# Patient Record
Sex: Male | Born: 1955 | Race: White | Hispanic: No | Marital: Married | Smoking: Current some day smoker
Health system: Southern US, Community
[De-identification: ages and names within clinical notes are randomized; demographics above are authoritative.]

## PROBLEM LIST (undated history)

## (undated) DIAGNOSIS — I10 Essential (primary) hypertension: Secondary | ICD-10-CM

## (undated) DIAGNOSIS — G1221 Amyotrophic lateral sclerosis: Secondary | ICD-10-CM

## (undated) DIAGNOSIS — R0689 Other abnormalities of breathing: Secondary | ICD-10-CM

## (undated) DIAGNOSIS — J45909 Unspecified asthma, uncomplicated: Secondary | ICD-10-CM

## (undated) HISTORY — PX: BACK SURGERY: SHX140

---

## 2008-07-14 HISTORY — PX: EYE SURGERY: SHX253

## 2013-01-25 DIAGNOSIS — G1221 Amyotrophic lateral sclerosis: Secondary | ICD-10-CM | POA: Insufficient documentation

## 2013-01-25 DIAGNOSIS — I10 Essential (primary) hypertension: Secondary | ICD-10-CM

## 2013-01-25 HISTORY — DX: Amyotrophic lateral sclerosis: G12.21

## 2013-01-25 HISTORY — DX: Essential (primary) hypertension: I10

## 2014-06-12 DIAGNOSIS — R0689 Other abnormalities of breathing: Secondary | ICD-10-CM | POA: Insufficient documentation

## 2014-06-12 HISTORY — DX: Other abnormalities of breathing: R06.89

## 2018-08-01 ENCOUNTER — Other Ambulatory Visit: Payer: Self-pay

## 2018-08-01 ENCOUNTER — Encounter (HOSPITAL_COMMUNITY)
Admission: EM | Disposition: A | Discharge status: Skilled Nursing Facility | Payer: Self-pay | Source: Home / Self Care | Attending: Student

## 2018-08-01 ENCOUNTER — Inpatient Hospital Stay (HOSPITAL_COMMUNITY)
Admission: EM | Admit: 2018-08-01 | Discharge: 2018-08-10 | DRG: 570 | Disposition: A | Discharge status: Skilled Nursing Facility | Payer: Medicare Other | Attending: Student | Admitting: Student

## 2018-08-01 ENCOUNTER — Inpatient Hospital Stay (HOSPITAL_COMMUNITY): Payer: Medicare Other | Admitting: Certified Registered Nurse Anesthetist

## 2018-08-01 ENCOUNTER — Encounter (HOSPITAL_COMMUNITY): Payer: Self-pay | Admitting: Internal Medicine

## 2018-08-01 ENCOUNTER — Emergency Department (HOSPITAL_COMMUNITY): Payer: Medicare Other

## 2018-08-01 DIAGNOSIS — I10 Essential (primary) hypertension: Secondary | ICD-10-CM | POA: Diagnosis present

## 2018-08-01 DIAGNOSIS — G8191 Hemiplegia, unspecified affecting right dominant side: Secondary | ICD-10-CM | POA: Diagnosis present

## 2018-08-01 DIAGNOSIS — L03116 Cellulitis of left lower limb: Principal | ICD-10-CM | POA: Insufficient documentation

## 2018-08-01 DIAGNOSIS — G1221 Amyotrophic lateral sclerosis: Secondary | ICD-10-CM | POA: Diagnosis present

## 2018-08-01 DIAGNOSIS — D638 Anemia in other chronic diseases classified elsewhere: Secondary | ICD-10-CM | POA: Diagnosis present

## 2018-08-01 DIAGNOSIS — M86172 Other acute osteomyelitis, left ankle and foot: Secondary | ICD-10-CM

## 2018-08-01 DIAGNOSIS — L089 Local infection of the skin and subcutaneous tissue, unspecified: Secondary | ICD-10-CM | POA: Diagnosis not present

## 2018-08-01 DIAGNOSIS — E43 Unspecified severe protein-calorie malnutrition: Secondary | ICD-10-CM | POA: Diagnosis present

## 2018-08-01 DIAGNOSIS — Z6821 Body mass index (BMI) 21.0-21.9, adult: Secondary | ICD-10-CM | POA: Diagnosis not present

## 2018-08-01 DIAGNOSIS — E46 Unspecified protein-calorie malnutrition: Secondary | ICD-10-CM | POA: Diagnosis present

## 2018-08-01 DIAGNOSIS — M869 Osteomyelitis, unspecified: Secondary | ICD-10-CM | POA: Diagnosis present

## 2018-08-01 DIAGNOSIS — E222 Syndrome of inappropriate secretion of antidiuretic hormone: Secondary | ICD-10-CM | POA: Diagnosis not present

## 2018-08-01 DIAGNOSIS — E876 Hypokalemia: Secondary | ICD-10-CM | POA: Diagnosis present

## 2018-08-01 DIAGNOSIS — Z87891 Personal history of nicotine dependence: Secondary | ICD-10-CM

## 2018-08-01 DIAGNOSIS — M726 Necrotizing fasciitis: Secondary | ICD-10-CM | POA: Diagnosis present

## 2018-08-01 DIAGNOSIS — B9561 Methicillin susceptible Staphylococcus aureus infection as the cause of diseases classified elsewhere: Secondary | ICD-10-CM | POA: Diagnosis present

## 2018-08-01 DIAGNOSIS — K769 Liver disease, unspecified: Secondary | ICD-10-CM | POA: Diagnosis present

## 2018-08-01 DIAGNOSIS — Z993 Dependence on wheelchair: Secondary | ICD-10-CM

## 2018-08-01 DIAGNOSIS — Z791 Long term (current) use of non-steroidal anti-inflammatories (NSAID): Secondary | ICD-10-CM | POA: Diagnosis not present

## 2018-08-01 DIAGNOSIS — J449 Chronic obstructive pulmonary disease, unspecified: Secondary | ICD-10-CM | POA: Diagnosis present

## 2018-08-01 DIAGNOSIS — A480 Gas gangrene: Secondary | ICD-10-CM | POA: Diagnosis present

## 2018-08-01 HISTORY — DX: Amyotrophic lateral sclerosis: G12.21

## 2018-08-01 HISTORY — DX: Other abnormalities of breathing: R06.89

## 2018-08-01 HISTORY — PX: I & D EXTREMITY: SHX5045

## 2018-08-01 HISTORY — DX: Essential (primary) hypertension: I10

## 2018-08-01 LAB — COMPREHENSIVE METABOLIC PANEL
ALT: 30 U/L (ref 0–44)
AST: 38 U/L (ref 15–41)
Albumin: 3 g/dL — ABNORMAL LOW (ref 3.5–5.0)
Alkaline Phosphatase: 89 U/L (ref 38–126)
Anion gap: 15 (ref 5–15)
BUN: 29 mg/dL — ABNORMAL HIGH (ref 8–23)
CO2: 26 mmol/L (ref 22–32)
Calcium: 8.7 mg/dL — ABNORMAL LOW (ref 8.9–10.3)
Chloride: 94 mmol/L — ABNORMAL LOW (ref 98–111)
Creatinine, Ser: 0.9 mg/dL (ref 0.61–1.24)
GFR calc Af Amer: >60 mL/min (ref >60)
GFR calc non Af Amer: >60 mL/min (ref >60)
Glucose, Bld: 103 mg/dL — ABNORMAL HIGH (ref 70–99)
Potassium: 3.3 mmol/L — ABNORMAL LOW (ref 3.5–5.1)
Sodium: 135 mmol/L (ref 135–145)
Total Bilirubin: 1.7 mg/dL — ABNORMAL HIGH (ref 0.3–1.2)
Total Protein: 7.7 g/dL (ref 6.5–8.1)

## 2018-08-01 LAB — CBC WITH DIFFERENTIAL/PLATELET
Abs Immature Granulocytes: 0.97 10*3/uL — ABNORMAL HIGH (ref 0.00–0.07)
Basophils Absolute: 0.1 10*3/uL (ref 0.0–0.1)
Basophils Relative: 1 %
Eosinophils Absolute: 0 10*3/uL (ref 0.0–0.5)
Eosinophils Relative: 0 %
HCT: 44.9 % (ref 39.0–52.0)
Hemoglobin: 14.4 g/dL (ref 13.0–17.0)
Immature Granulocytes: 3 %
Lymphocytes Relative: 4 %
Lymphs Abs: 1.2 10*3/uL (ref 0.7–4.0)
MCH: 29.9 pg (ref 26.0–34.0)
MCHC: 32.1 g/dL (ref 30.0–36.0)
MCV: 93.2 fL (ref 80.0–100.0)
MONOS PCT: 9 %
Monocytes Absolute: 2.4 10*3/uL — ABNORMAL HIGH (ref 0.1–1.0)
NEUTROS ABS: 23.5 10*3/uL — AB (ref 1.7–7.7)
Neutrophils Relative %: 83 %
Platelets: 439 10*3/uL — ABNORMAL HIGH (ref 150–400)
RBC: 4.82 MIL/uL (ref 4.22–5.81)
RDW: 13.2 % (ref 11.5–15.5)
WBC: 28.3 10*3/uL — ABNORMAL HIGH (ref 4.0–10.5)
nRBC: 0 % (ref 0.0–0.2)

## 2018-08-01 LAB — PROTIME-INR
INR: 1.19
Prothrombin Time: 15 seconds (ref 11.4–15.2)

## 2018-08-01 LAB — I-STAT CG4 LACTIC ACID, ED: Lactic Acid, Venous: 1.59 mmol/L (ref 0.5–1.9)

## 2018-08-01 SURGERY — IRRIGATION AND DEBRIDEMENT EXTREMITY
Anesthesia: General | Site: Foot | Laterality: Left

## 2018-08-01 MED ORDER — PROPOFOL 10 MG/ML IV BOLUS
INTRAVENOUS | Status: DC | PRN
  Administered 2018-08-01: 150 mg via INTRAVENOUS

## 2018-08-01 MED ORDER — PHENYLEPHRINE 40 MCG/ML (10ML) SYRINGE FOR IV PUSH (FOR BLOOD PRESSURE SUPPORT)
PREFILLED_SYRINGE | INTRAVENOUS | Status: DC | PRN
  Administered 2018-08-01: 120 ug via INTRAVENOUS
  Administered 2018-08-01: 80 ug via INTRAVENOUS
  Administered 2018-08-01: 120 ug via INTRAVENOUS
  Administered 2018-08-01: 80 ug via INTRAVENOUS
  Administered 2018-08-01 (×2): 120 ug via INTRAVENOUS
  Administered 2018-08-01 (×2): 80 ug via INTRAVENOUS

## 2018-08-01 MED ORDER — SODIUM CHLORIDE 0.9 % IV BOLUS
1000.0000 mL | Freq: Once | INTRAVENOUS | Status: AC
  Administered 2018-08-01: 1000 mL via INTRAVENOUS

## 2018-08-01 MED ORDER — SODIUM CHLORIDE 0.9 % IV SOLN
INTRAVENOUS | Status: DC
  Administered 2018-08-01 – 2018-08-07 (×12): via INTRAVENOUS

## 2018-08-01 MED ORDER — MORPHINE SULFATE (PF) 4 MG/ML IV SOLN
4.0000 mg | Freq: Once | INTRAVENOUS | Status: AC
  Administered 2018-08-01: 4 mg via INTRAVENOUS
  Filled 2018-08-01: qty 1

## 2018-08-01 MED ORDER — ROCURONIUM BROMIDE 100 MG/10ML IV SOLN
INTRAVENOUS | Status: AC
  Filled 2018-08-01: qty 1

## 2018-08-01 MED ORDER — CLINDAMYCIN PHOSPHATE 600 MG/50ML IV SOLN
600.0000 mg | Freq: Once | INTRAVENOUS | Status: AC
  Administered 2018-08-01: 600 mg via INTRAVENOUS
  Filled 2018-08-01: qty 50

## 2018-08-01 MED ORDER — IPRATROPIUM-ALBUTEROL 0.5-2.5 (3) MG/3ML IN SOLN: 3.0000 mL | Freq: Four times a day (QID) | RESPIRATORY_TRACT | Status: DC | PRN

## 2018-08-01 MED ORDER — ONDANSETRON HCL 4 MG/2ML IJ SOLN: 4.0000 mg | Freq: Once | INTRAMUSCULAR | Status: DC | PRN

## 2018-08-01 MED ORDER — ONDANSETRON HCL 4 MG/2ML IJ SOLN
INTRAMUSCULAR | Status: DC | PRN
  Administered 2018-08-01: 4 mg via INTRAVENOUS

## 2018-08-01 MED ORDER — HEPARIN SODIUM (PORCINE) 5000 UNIT/ML IJ SOLN
5000.0000 [IU] | Freq: Three times a day (TID) | INTRAMUSCULAR | Status: DC
  Administered 2018-08-02 – 2018-08-10 (×24): 5000 [IU] via SUBCUTANEOUS
  Filled 2018-08-01 (×23): qty 1

## 2018-08-01 MED ORDER — OXYCODONE HCL 5 MG/5ML PO SOLN
5.0000 mg | Freq: Once | ORAL | Status: DC | PRN
  Filled 2018-08-01: qty 5

## 2018-08-01 MED ORDER — ALBUTEROL SULFATE (2.5 MG/3ML) 0.083% IN NEBU
INHALATION_SOLUTION | RESPIRATORY_TRACT | Status: AC
  Filled 2018-08-01: qty 3

## 2018-08-01 MED ORDER — SODIUM CHLORIDE 0.9 % IR SOLN
Status: DC | PRN
  Administered 2018-08-01: 3000 mL

## 2018-08-01 MED ORDER — PROPOFOL 10 MG/ML IV BOLUS
INTRAVENOUS | Status: AC
  Filled 2018-08-01: qty 20

## 2018-08-01 MED ORDER — 0.9 % SODIUM CHLORIDE (POUR BTL) OPTIME
TOPICAL | Status: DC | PRN
  Administered 2018-08-01: 1000 mL

## 2018-08-01 MED ORDER — ALBUTEROL SULFATE (2.5 MG/3ML) 0.083% IN NEBU
2.5000 mg | INHALATION_SOLUTION | Freq: Four times a day (QID) | RESPIRATORY_TRACT | Status: DC | PRN
  Administered 2018-08-01: 2.5 mg via RESPIRATORY_TRACT

## 2018-08-01 MED ORDER — ONDANSETRON HCL 4 MG/2ML IJ SOLN
INTRAMUSCULAR | Status: AC
  Filled 2018-08-01: qty 2

## 2018-08-01 MED ORDER — PIPERACILLIN-TAZOBACTAM 3.375 G IVPB 30 MIN
3.3750 g | Freq: Once | INTRAVENOUS | Status: AC
  Administered 2018-08-01: 3.375 g via INTRAVENOUS
  Filled 2018-08-01: qty 50

## 2018-08-01 MED ORDER — PIPERACILLIN-TAZOBACTAM 3.375 G IVPB
INTRAVENOUS | Status: AC
  Filled 2018-08-01: qty 50

## 2018-08-01 MED ORDER — OXYCODONE HCL 5 MG PO TABS: 5.0000 mg | ORAL_TABLET | Freq: Once | ORAL | Status: DC | PRN

## 2018-08-01 MED ORDER — CLINDAMYCIN PHOSPHATE 600 MG/50ML IV SOLN
600.0000 mg | Freq: Three times a day (TID) | INTRAVENOUS | Status: DC
  Administered 2018-08-02 – 2018-08-03 (×4): 600 mg via INTRAVENOUS
  Filled 2018-08-01 (×4): qty 50

## 2018-08-01 MED ORDER — LIDOCAINE 2% (20 MG/ML) 5 ML SYRINGE
INTRAMUSCULAR | Status: DC | PRN
  Administered 2018-08-01: 80 mg via INTRAVENOUS

## 2018-08-01 MED ORDER — PHENYLEPHRINE 40 MCG/ML (10ML) SYRINGE FOR IV PUSH (FOR BLOOD PRESSURE SUPPORT)
PREFILLED_SYRINGE | INTRAVENOUS | Status: AC
  Filled 2018-08-01: qty 10

## 2018-08-01 MED ORDER — HYDROMORPHONE HCL 1 MG/ML IJ SOLN: 1.0000 mg | INTRAMUSCULAR | Status: DC | PRN

## 2018-08-01 MED ORDER — DEXAMETHASONE SODIUM PHOSPHATE 10 MG/ML IJ SOLN
INTRAMUSCULAR | Status: DC | PRN
  Administered 2018-08-01: 10 mg via INTRAVENOUS

## 2018-08-01 MED ORDER — DEXAMETHASONE SODIUM PHOSPHATE 10 MG/ML IJ SOLN
INTRAMUSCULAR | Status: AC
  Filled 2018-08-01: qty 1

## 2018-08-01 MED ORDER — HYDROMORPHONE HCL 1 MG/ML IJ SOLN
1.0000 mg | Freq: Once | INTRAMUSCULAR | Status: AC
  Administered 2018-08-01: 1 mg via INTRAVENOUS
  Filled 2018-08-01: qty 1

## 2018-08-01 MED ORDER — FENTANYL CITRATE (PF) 100 MCG/2ML IJ SOLN
INTRAMUSCULAR | Status: DC | PRN
  Administered 2018-08-01 (×2): 25 ug via INTRAVENOUS
  Administered 2018-08-01: 50 ug via INTRAVENOUS

## 2018-08-01 MED ORDER — FENTANYL CITRATE (PF) 100 MCG/2ML IJ SOLN: 25.0000 ug | INTRAMUSCULAR | Status: DC | PRN

## 2018-08-01 MED ORDER — PIPERACILLIN-TAZOBACTAM 3.375 G IVPB
3.3750 g | Freq: Three times a day (TID) | INTRAVENOUS | Status: DC
  Administered 2018-08-02 – 2018-08-07 (×16): 3.375 g via INTRAVENOUS
  Filled 2018-08-01 (×18): qty 50

## 2018-08-01 MED ORDER — VANCOMYCIN HCL 10 G IV SOLR
1500.0000 mg | Freq: Once | INTRAVENOUS | Status: AC
  Administered 2018-08-01: 1500 mg via INTRAVENOUS
  Filled 2018-08-01: qty 1500

## 2018-08-01 MED ORDER — LIDOCAINE 2% (20 MG/ML) 5 ML SYRINGE
INTRAMUSCULAR | Status: AC
  Filled 2018-08-01: qty 5

## 2018-08-01 MED ORDER — FENTANYL CITRATE (PF) 100 MCG/2ML IJ SOLN
INTRAMUSCULAR | Status: AC
  Filled 2018-08-01: qty 2

## 2018-08-01 MED ORDER — POTASSIUM CHLORIDE 10 MEQ/100ML IV SOLN
10.0000 meq | INTRAVENOUS | Status: AC
  Administered 2018-08-02 (×2): 10 meq via INTRAVENOUS
  Filled 2018-08-01: qty 100

## 2018-08-01 SURGICAL SUPPLY — 47 items
BAG ZIPLOCK 12X15 (MISCELLANEOUS) ×3 IMPLANT
BANDAGE ACE 6X5 VEL STRL LF (GAUZE/BANDAGES/DRESSINGS) IMPLANT
BANDAGE ESMARK 6X9 LF (GAUZE/BANDAGES/DRESSINGS) IMPLANT
BNDG COHESIVE 4X5 TAN STRL (GAUZE/BANDAGES/DRESSINGS) ×3 IMPLANT
BNDG ESMARK 6X9 LF (GAUZE/BANDAGES/DRESSINGS)
COVER SURGICAL LIGHT HANDLE (MISCELLANEOUS) ×3 IMPLANT
COVER WAND RF STERILE (DRAPES) IMPLANT
CUFF TOURN SGL QUICK 34 (TOURNIQUET CUFF) ×2
CUFF TRNQT CYL 34X4X40X1 (TOURNIQUET CUFF) ×1 IMPLANT
DERMABOND ADVANCED (GAUZE/BANDAGES/DRESSINGS)
DERMABOND ADVANCED .7 DNX12 (GAUZE/BANDAGES/DRESSINGS) IMPLANT
DRAPE EXTREMITY T 121X128X90 (DISPOSABLE) IMPLANT
DRSG ADAPTIC 3X8 NADH LF (GAUZE/BANDAGES/DRESSINGS) IMPLANT
DRSG AQUACEL AG ADV 3.5X10 (GAUZE/BANDAGES/DRESSINGS) IMPLANT
DRSG PAD ABDOMINAL 8X10 ST (GAUZE/BANDAGES/DRESSINGS) IMPLANT
DRSG TEGADERM 4X4.75 (GAUZE/BANDAGES/DRESSINGS) IMPLANT
DURAPREP 26ML APPLICATOR (WOUND CARE) IMPLANT
ELECT REM PT RETURN 15FT ADLT (MISCELLANEOUS) ×3 IMPLANT
EVACUATOR 1/8 PVC DRAIN (DRAIN) IMPLANT
GAUZE SPONGE 2X2 8PLY STRL LF (GAUZE/BANDAGES/DRESSINGS) IMPLANT
GAUZE SPONGE 4X4 12PLY STRL (GAUZE/BANDAGES/DRESSINGS) ×3 IMPLANT
GAUZE XEROFORM 5X9 LF (GAUZE/BANDAGES/DRESSINGS) ×3 IMPLANT
GLOVE BIO SURGEON STRL SZ7.5 (GLOVE) ×3 IMPLANT
GLOVE BIOGEL PI IND STRL 8 (GLOVE) ×1 IMPLANT
GLOVE BIOGEL PI INDICATOR 8 (GLOVE) ×2
GLOVE ECLIPSE 8.0 STRL XLNG CF (GLOVE) ×3 IMPLANT
GOWN SPEC L3 XXLG W/TWL (GOWN DISPOSABLE) ×6 IMPLANT
GOWN STRL REUS W/TWL LRG LVL3 (GOWN DISPOSABLE) ×3 IMPLANT
HANDPIECE INTERPULSE COAX TIP (DISPOSABLE) ×2
KIT BASIN OR (CUSTOM PROCEDURE TRAY) ×3 IMPLANT
MANIFOLD NEPTUNE II (INSTRUMENTS) ×3 IMPLANT
PACK GENERAL/GYN (CUSTOM PROCEDURE TRAY) ×3 IMPLANT
PACK TOTAL JOINT (CUSTOM PROCEDURE TRAY) IMPLANT
PAD ABD 7.5X8 STRL (GAUZE/BANDAGES/DRESSINGS) ×3 IMPLANT
PADDING CAST COTTON 6X4 STRL (CAST SUPPLIES) IMPLANT
PROTECTOR NERVE ULNAR (MISCELLANEOUS) ×3 IMPLANT
SET HNDPC FAN SPRY TIP SCT (DISPOSABLE) ×1 IMPLANT
SPONGE GAUZE 2X2 STER 10/PKG (GAUZE/BANDAGES/DRESSINGS)
STAPLER VISISTAT 35W (STAPLE) IMPLANT
SUT ETHILON 2 0 PS N (SUTURE) ×9 IMPLANT
SUT MNCRL AB 4-0 PS2 18 (SUTURE) IMPLANT
SUT VIC AB 1 CT1 36 (SUTURE) IMPLANT
SUT VIC AB 2-0 CT1 27 (SUTURE)
SUT VIC AB 2-0 CT1 TAPERPNT 27 (SUTURE) IMPLANT
SWAB COLLECTION DEVICE MRSA (MISCELLANEOUS) ×3 IMPLANT
SWAB CULTURE ESWAB REG 1ML (MISCELLANEOUS) ×3 IMPLANT
TOWEL OR 17X26 10 PK STRL BLUE (TOWEL DISPOSABLE) ×3 IMPLANT

## 2018-08-01 NOTE — ED Provider Notes (Signed)
[facility] Provider Note   CSN: [id number] Arrival date & time: 08/01/18  1711     History   Chief Complaint Chief Complaint  Patient presents with  . Wound Infection    HPI Bruce Deleon is a 63 y.o. male.  HPI Patient is a 63 year old male presents the emergency department with worsening left medial ankle pain today.  He has had a chronic wound of his left second toe and a chronic wound of the distal aspect of his left medial foot over the past month.  Over the past several days he developed new erythema of the left medial malleolus and today developed a small area of blistering and ecchymosis and some increasing pain and thus he came to the ER for further evaluation.  No fever chills at home.  No history of diabetes.  He has a history of hypertension and ALS.  His pain is moderate in severity.   Past medical history . ALS (amyotrophic lateral sclerosis)  . HTN (hypertension)  . Mixed dysarthria  . Dysphagia, oropharyngeal  . General weakness  . Chronic respiratory insufficiency           Home Medications    Prior to Admission medications   Medication Sig Start Date End Date Taking? Authorizing Provider  naproxen sodium (ALEVE) 220 MG tablet Take 880 mg by mouth 2 (two) times daily.   Yes [provider]    Family History No family history on file.  Social History Social History   Tobacco Use  . Smoking status: Not on file  Substance Use Topics  . Alcohol use: Not on file  . Drug use: Not on file     Allergies   Patient has no known allergies.   Review of Systems Review of Systems  All other systems reviewed and are negative.    Physical Exam Updated Vital Signs BP (!) 145/82 (BP Location: Right Arm)   Pulse 99   Temp 98.5 F (36.9 C) (Oral)   Resp (!) 25   Ht 6' (1.829 m)   Wt 72.6 kg   SpO2 99%   BMI 21.70 kg/m   Physical Exam Vitals signs and nursing note reviewed.  Constitutional:      Appearance: He is well-developed.  HENT:     Head: Normocephalic and atraumatic.  Neck:     Musculoskeletal: Normal range of motion.  Cardiovascular:     Rate and Rhythm: Regular rhythm. Tachycardia present.     Heart sounds: Normal heart sounds.  Pulmonary:     Effort: Pulmonary effort is normal. No respiratory distress.     Breath sounds: Normal breath sounds.  Abdominal:     General: There is no distension.     Palpations: Abdomen is soft.     Tenderness: There is no abdominal tenderness.  Musculoskeletal: Normal range of motion.     Comments: Chronic wound of the left second toe secondary to overlying nail from the left great toe.  There is an area of dry gangrene of the distal aspect of the left medial foot.  Erythema overlying the left medial malleolus with blistering, ecchymosis, crepitus extending up the left medial lower leg.  Dopplerable PT and DP pulse in the left foot.  Skin:    General: Skin is warm and dry.  Neurological:     Mental Status: He is alert and oriented to person, place, and time.  Psychiatric:        Judgment: Judgment normal.  ED Treatments / Results  Labs (all labs ordered are listed, but only abnormal results are displayed) Labs Reviewed  COMPREHENSIVE METABOLIC PANEL - Abnormal; Notable for the following components:      Result Value   Potassium 3.3 (*)    Chloride 94 (*)    Glucose, Bld 103 (*)    BUN 29 (*)    Calcium 8.7 (*)    Albumin 3.0 (*)    Total Bilirubin 1.7 (*)    All other components within normal limits  CBC WITH DIFFERENTIAL/PLATELET - Abnormal; Notable for the following components:   WBC 28.3 (*)    Platelets 439 (*)    Neutro Abs 23.5 (*)    Monocytes Absolute 2.4 (*)    Abs Immature Granulocytes 0.97 (*)    All other components within normal limits  CULTURE, BLOOD (ROUTINE X 2)  CULTURE, BLOOD (ROUTINE X 2)  PROTIME-INR  I-STAT CG4 LACTIC ACID, ED  I-STAT CG4 LACTIC ACID, ED    EKG None  Radiology Dg  Ankle Complete Left  Result Date: 08/01/2018 CLINICAL DATA:  Left foot wound. EXAM: LEFT ANKLE COMPLETE - 3+ VIEW COMPARISON:  None. FINDINGS: Soft tissue air identified in the medial left ankle and medial left forefoot. There is question osteopenia of the distal aspect of the talus. IMPRESSION: Soft tissue air identified in the medial left ankle medial left forefoot. There is questioned osteopenia in the distal aspect of the talus suspicious for osteomyelitis. Electronically Signed   By: Sherian ReinWei-Chen  Lin M.D.   On: 08/01/2018 19:24   Dg Foot Complete Left  Result Date: 08/01/2018 CLINICAL DATA:  Left foot wound. EXAM: LEFT FOOT - COMPLETE 3+ VIEW COMPARISON:  None. FINDINGS: There is osteopenia of distal aspect of the first metatarsal and proximal aspect of the first proximal phalanx suspicious for osteomyelitis. Surrounding soft tissue air is identified. There is soft tissue air identified in the medial left forefoot. There is question osteopenia of the subjacent distal talus, osteomyelitis is not excluded. IMPRESSION: Findings suspicious for osteomyelitis of the distal aspect of the first metatarsal and proximal aspect of the first proximal phalanx. Question osteopenia in the distal talus subjacent to the soft tissue air, osteomyelitis is not excluded. Electronically Signed   By: Sherian ReinWei-Chen  Lin M.D.   On: 08/01/2018 19:22    Procedures .Critical Care Performed by: Azalia Bilisampos, Odysseus Cada, MD Authorized by: Azalia Bilisampos, Dietrich Samuelson, MD   Critical care provider statement:    Critical care time (minutes):  32   Critical care was time spent personally by me on the following activities:  Discussions with consultants, evaluation of patient's response to treatment, examination of patient, ordering and performing treatments and interventions, ordering and review of laboratory studies, ordering and review of radiographic studies, pulse oximetry, re-evaluation of patient's condition, obtaining history from patient or surrogate and  review of old charts   (including critical care time)  Medications Ordered in ED Medications  clindamycin (CLEOCIN) IVPB 600 mg (has no administration in time range)  piperacillin-tazobactam (ZOSYN) IVPB 3.375 g (0 g Intravenous Stopped 08/01/18 1856)  vancomycin (VANCOCIN) 1,500 mg in sodium chloride 0.9 % 500 mL IVPB (1,500 mg Intravenous New Bag/Given 08/01/18 1828)  morphine 4 MG/ML injection 4 mg (4 mg Intravenous Given 08/01/18 1852)     Initial Impression / Assessment and Plan / ED Course  I have reviewed the triage vital signs and the nursing notes.  Pertinent labs & imaging results that were available during my care of the patient were reviewed  by me and considered in my medical decision making (see chart for details).     Worsening cellulitis with concern for gas-forming agent given air noted on plain film and palpable crepitus.  Vancomycin, Zosyn, clindamycin given.  N.p.o.  Case discussed with orthopedic surgeon Dr. Magnus Ivan who will evaluate the patient at the bedside.  Patient will be admitted to the hospitalist service.  White blood cell count of 28,000 noted.  No hypotension.  No fever.  Nontoxic-appearing at the bedside however concern for worsening aggressive cellulitis of the left foot and ankle.   Final Clinical Impressions(s) / ED Diagnoses   Final diagnoses:  Cellulitis of left foot    ED Discharge Orders    None       Azalia Bilis, MD 08/01/18 2109

## 2018-08-01 NOTE — Brief Op Note (Signed)
08/01/2018  11:29 PM  PATIENT:  Ludwin Halpin  63 y.o. male  PRE-OPERATIVE DIAGNOSIS:  necrotizing fascitis left foot/ankle wound  POST-OPERATIVE DIAGNOSIS:  necrotizing fascitis left foot ankle wound  PROCEDURE:  Procedure(s): IRRIGATION AND DEBRIDEMENT foot and ankle amputation first and second rays (Left)  SURGEON:  Surgeon(s) and Role:    Kathryne Hitch, MD - Primary  PHYSICIAN ASSISTANT: Rexene Edison, PA-C  ANESTHESIA:   general  EBL:  25 mL   COUNTS:  YES  TOURNIQUET:   Total Tourniquet Time Documented: Thigh (Left) - 35 minutes Total: Thigh (Left) - 35 minutes   DICTATION: .Other Dictation: Dictation Number [id number]  PLAN OF CARE: Admit to inpatient   PATIENT DISPOSITION:  PACU - guarded condition.   Delay start of Pharmacological VTE agent (>24hrs) due to surgical blood loss or risk of bleeding: yes

## 2018-08-01 NOTE — Anesthesia Procedure Notes (Signed)
Procedure Name: LMA Insertion Date/Time: 08/01/2018 10:34 PM Performed by: Epimenio Sarin, CRNA Pre-anesthesia Checklist: Patient identified, Emergency Drugs available, Suction available, Patient being monitored and Timeout performed Patient Re-evaluated:Patient Re-evaluated prior to induction Oxygen Delivery Method: Circle system utilized Preoxygenation: Pre-oxygenation with 100% oxygen Induction Type: IV induction LMA: LMA with gastric port inserted LMA Size: 4.0 Number of attempts: 1 Dental Injury: Teeth and Oropharynx as per pre-operative assessment

## 2018-08-01 NOTE — H&P (Signed)
History and Physical  Bruce Deleon DVV:[id number] DOB: 1955-11-27 DOA: 08/01/2018 1711  Referring physician: Haywood Lasso [facility]) PCP: Seward Meth, MD  Outpatient Specialists: [facility]  HISTORY   Chief Complaint: L foot wounds and swelling  HPI: Bruce Deleon is a 63 y.o. male with ALS (mainly R sided paraplegia) and hx of HTN who presents from home with acute swelling and pain over L medial ankle. Patient and wife reports that he has had chronic wounds for several weeks over his left lateral 1st toe MTP and L 2nd toe, but new wound on L medial ankle was noted over the past week. Patient thinks that these wounds initially started as pressure ulcers. Severe throbbing pain over the L medial ankle, especially with palpation. Erythema extending into upper foot over the past few days. Per wife, no other pressure ulcers that she has noticed. She had also mentioned that patient is reticent to see medical providers. Denies fevers or chills. Has not noticed any drainage over the other L foot wounds. Patient reports that he does not use gauze or dressing/wound care for the wounds on his L foot other than changing his socks everyday. Wife also reported that they had acquired a wheelchair recently, but that he has not been able to use it because their house is not wheelchair accessible and has no ramp. Regarding ALS, patient reports that he has been able to ambulate with a walker until the past few weeks when he has been limited due to his L foot pain. Main symptoms are R sided paraplegia and R upper extremity weakness. Denies shortness of breath.   Review of Systems:  - no fevers/chills - no cough - no chest pain, dyspnea on exertion - no edema, PND, orthopnea - no nausea/vomiting; no tarry, melanotic or bloody stools - no dysuria, increased urinary frequency - no weight changes  Rest of systems reviewed are negative, except as per above history.   ED course:  Vitals Blood pressure (!) 146/69,  pulse 87, temperature 98.5 F (36.9 C), temperature source Oral, resp. rate (!) 25, height 6' (1.829 m), weight 72.6 kg, SpO2 99 %. Received IV vanc, zosyn, clindamycin; dilaudid 1mg  x 1; morphine 4mg  x 1; NS bolus x 1  Past Medical History:  Diagnosis Date  . 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 Chronic respiratory insufficiency 06/12/2014  . Essential hypertension 01/25/2013   History reviewed. No pertinent surgical history.  Social History:  reports that he has quit smoking. He does not have any smokeless tobacco history on file. No history on file for alcohol and drug.  No Known Allergies  No family history on file.    Prior to Admission medications   Medication Sig Start Date End Date Taking? Authorizing Provider  naproxen sodium (ALEVE) 220 MG tablet Take 880 mg by mouth 2 (two) times daily.   Yes [provider]    PHYSICAL EXAM   Temp:  [98.5 F (36.9 C)] 98.5 F (36.9 C) (01/19 1741) Pulse Rate:  [80-107] 87 (01/19 2100) Resp:  [16-26] 25 (01/19 2100) BP: (137-160)/(69-87) 146/69 (01/19 2100) SpO2:  [96 %-100 %] 99 % (01/19 2100) Weight:  [72.6 kg] 72.6 kg (01/19 1741)  BP (!) 146/69   Pulse 87   Temp 98.5 F (36.9 C) (Oral)   Resp (!) 25   Ht 6' (1.829 m)   Wt 72.6 kg   SpO2 99%   BMI 21.70 kg/m    GEN thin elderly caucasian  male; resting in bed, mildly uncomfortable; breathing unlabored  HEENT NCAT EOM intact PERRL; clear oropharynx, no cervical LAD; dry mucus membranes  JVP estimated 5 cm H2O above RA; no HJR ; no carotid bruits b/l ;  CV regular normal rate; normal S1 and S2; no m/r/g or S3/S4; PMI non displaced; no parasternal heave  RESP  End expiratory wheezes; clear on inspiration; breathing unlabored and symmetric  ABD soft NT ND +normoactive BS  EXT warm throughout b/l; no peripheral edema b/l  PULSES  DP and radials 2+ intact b/l  SKIN/MSK  Multiple protruding skin-colored round lesions on face, non-erythematous; no  purulence or discharge  L foot: large erythematous region with edema over L medial ankle with yellow blister visible over medial malleolus; +crepitus and tenderness with palpation;  large black eschar over L lateral MTP 1st toe with long fiber-like foreign object protruding from middle of wound Deep wound with black eschar over distal 2nd toe where 1st toenail has punctured the skin NEURO/PSYCH AAOx4; 3/5 strength over RLE; did not test LLE motor strength; 4/5 RUE strength; 5/5 LUE strength.   DATA   LABS ON ADMISSION:  Basic Metabolic Panel: Recent Labs  Lab 08/01/18 1751  NA 135  K 3.3*  CL 94*  CO2 26  GLUCOSE 103*  BUN 29*  CREATININE 0.90  CALCIUM 8.7*   CBC: Recent Labs  Lab 08/01/18 1751  WBC 28.3*  NEUTROABS 23.5*  HGB 14.4  HCT 44.9  MCV 93.2  PLT 439*   Liver Function Tests: Recent Labs  Lab 08/01/18 1751  AST 38  ALT 30  ALKPHOS 89  BILITOT 1.7*  PROT 7.7  ALBUMIN 3.0*   No results for input(s): LIPASE, AMYLASE in the last 168 hours. No results for input(s): AMMONIA in the last 168 hours. Coagulation:  Lab Results  Component Value Date   INR 1.19 08/01/2018   No results found for: PTT Lactic Acid, Venous:     Component Value Date/Time   LATICACIDVEN 1.59 08/01/2018 1758   Cardiac Enzymes: No results for input(s): CKTOTAL, CKMB, CKMBINDEX, TROPONINI in the last 168 hours. Urinalysis: No results found for: COLORURINE, APPEARANCEUR, LABSPEC, PHURINE, GLUCOSEU, HGBUR, BILIRUBINUR, KETONESUR, PROTEINUR, UROBILINOGEN, NITRITE, LEUKOCYTESUR  BNP (last 3 results) No results for input(s): PROBNP in the last 8760 hours. CBG: No results for input(s): GLUCAP in the last 168 hours.  Radiological Exams on Admission: Dg Ankle Complete Left  Result Date: 08/01/2018 CLINICAL DATA:  Left foot wound. EXAM: LEFT ANKLE COMPLETE - 3+ VIEW COMPARISON:  None. FINDINGS: Soft tissue air identified in the medial left ankle and medial left forefoot. There is  question osteopenia of the distal aspect of the talus. IMPRESSION: Soft tissue air identified in the medial left ankle medial left forefoot. There is questioned osteopenia in the distal aspect of the talus suspicious for osteomyelitis. Electronically Signed   By: Sherian ReinWei-Chen  Lin M.D.   On: 08/01/2018 19:24   Dg Foot Complete Left  Result Date: 08/01/2018 CLINICAL DATA:  Left foot wound. EXAM: LEFT FOOT - COMPLETE 3+ VIEW COMPARISON:  None. FINDINGS: There is osteopenia of distal aspect of the first metatarsal and proximal aspect of the first proximal phalanx suspicious for osteomyelitis. Surrounding soft tissue air is identified. There is soft tissue air identified in the medial left forefoot. There is question osteopenia of the subjacent distal talus, osteomyelitis is not excluded. IMPRESSION: Findings suspicious for osteomyelitis of the distal aspect of the first metatarsal and proximal aspect of the first proximal  phalanx. Question osteopenia in the distal talus subjacent to the soft tissue air, osteomyelitis is not excluded. Electronically Signed   By: Sherian Rein M.D.   On: 08/01/2018 19:22    EKG: admission EKG pending  I have reviewed the patient's previous electronic chart records, labs, and other data.   ASSESSMENT AND PLAN   Assessment: Dragon Stagg is a 63 y.o. male with ALS (mainly R sided paraplegia) who presents with acute swelling and pain in L medial ankle and forefoot in setting of chronic wounds on the L foot with exam and imaging concerning for gas infiltration in the soft tissues at the medial malleolus. Given the severity of the infection, plan for orthopedics intervention in the OR tonight. Regarding his long-term care, will need to discuss with patient and family regarding his current level of care at home and potential placement on this admission, given the extent of his foot wounds that were left untreated for several weeks.   Active Problems:   Osteomyelitis (HCC)   Plan:     # Left medial malleolus infection with gas in soft tissue - concerning for necrotizing fasciitis vs gas gangrene with possible underlying osteomyelitis in distal talus > chronic wounds in L lateral 1st toe MTP and distal 2nd toe wound caused by 1st toenail  - abx: vancomycin, zosyn, and IV clindamycin (D1 08/01/18) tonight - orthopedics Magnus Ivan) consulted; plan for OR tonight - NPO - type and screen ordered - admit to telemetry post op / or as per orthopedics  - pain control: IV dilaudid - fluids with NS @ 100 cc/hr   # ALS (mainly R sided paraplegia at this time) > note: home is currently not wheelchair accessible - patient will need closer monitoring at home given extent of foot wounds while at home - plan to discuss with family during admission regarding placement - PT/OT eval ordered  - SW consult ordered  # Reported COPD and former smoker > mild end expiratory wheezing on exam; pt denies resp sx on admission  - duonebs q6h prn SOB/wheezing  - likely will benefit from steroid/LABA inhaler  - incentive spirometer  # Hypokalemia K 3.3  - repleting overnight with IV K 20 mEq  - repeat BMP in AM  # Hx of HTN - currently not on any medications - allow permissive HTNs to systolics 150s given infection      DVT Prophylaxis: heparin sq  Code Status:  Full Code Family Communication: patient and wife at bedside  Disposition Plan: admit to [facility] for emergent ortho evaluation and OR; dispo pending PT/OT and family discussion   Patient contact: Extended Emergency Contact Information Primary Emergency Contact: Ollen Gross Mobile Phone: [phone] Relation: Spouse  Time spent: > 35 mins  Ike Bene, MD [facility] Pager [phone]  If 7PM-7AM, please contact night-coverage [email or web address] Password [facility] 08/01/2018, 9:38 PM

## 2018-08-01 NOTE — ED Triage Notes (Signed)
Pt BIBA from home with c/o left food wound, that is progressing from bottom of foot towards ankle.  Pt reports it started as small blister on foot, has progressively gotten worse.  Pt reports having ALS and has difficulty getting to doctor appts.  EMS reports expiratory wheezes, current smoker. Pt denies CP, SHOB.  Pt also reports poor appetite x3 days.  Hx of hypertension, noncompliant with BP meds for more than a year.

## 2018-08-01 NOTE — Anesthesia Preprocedure Evaluation (Addendum)
Anesthesia Evaluation  Patient identified by MRN, date of birth, ID band Patient awake    Reviewed: Allergy & Precautions, NPO status , Patient's Chart, lab work & pertinent test results  History of Anesthesia Complications Negative for: history of anesthetic complications  Airway Mallampati: II  TM Distance: >3 FB Neck ROM: Full    Dental  (+) Dental Advisory Given, Edentulous Upper, Missing   Pulmonary former smoker,    breath sounds clear to auscultation       Cardiovascular hypertension,  Rhythm:Regular Rate:Normal     Neuro/Psych  ALS (right-sided paraplegia)   Neuromuscular disease negative psych ROS   GI/Hepatic negative GI ROS, Neg liver ROS,   Endo/Other  negative endocrine ROS  Renal/GU negative Renal ROS     Musculoskeletal negative musculoskeletal ROS (+)   Abdominal   Peds  Hematology negative hematology ROS (+)   Anesthesia Other Findings   Reproductive/Obstetrics                            Anesthesia Physical Anesthesia Plan  ASA: III and emergent  Anesthesia Plan: General   Post-op Pain Management:    Induction: Intravenous  PONV Risk Score and Plan: 2 and Treatment may vary due to age or medical condition, Ondansetron and Dexamethasone  Airway Management Planned: LMA  Additional Equipment: None  Intra-op Plan:   Post-operative Plan: Extubation in OR  Informed Consent: I have reviewed the patients History and Physical, chart, labs and discussed the procedure including the risks, benefits and alternatives for the proposed anesthesia with the patient or authorized representative who has indicated his/her understanding and acceptance.     Dental advisory given  Plan Discussed with: CRNA and Anesthesiologist  Anesthesia Plan Comments:        Anesthesia Quick Evaluation

## 2018-08-01 NOTE — Progress Notes (Signed)
A consult was received from an ED physician for Vancomycin per pharmacy dosing.  The patient's profile has been reviewed for ht/wt/allergies/indication/available labs.    A one time order has been placed for Vancomycin 1500mg  IV.  Further antibiotics/pharmacy consults should be ordered by admitting physician if indicated.                       Thank you, Jamse Mead 08/01/2018  6:08 PM

## 2018-08-01 NOTE — Consult Note (Signed)
Reason for Consult: Left foot and ankle infection Referring Physician: Patria Mane, MD/EDP  Bruce Deleon is an 63 y.o. male.  HPI: The patient is a 63 year old gentleman who presented to the emergency room today with worsening left ankle pain, redness as well as fever and chills.  He is someone with ALS.  He has also had a necrotic left foot second toe and a necrotic ulcer on his left great toe for over a month now.  He has not sought any type of treatment for this at all.  Upon presentation to the emergency room, he was noted to have a white blood cell count of 28,000.  He is slightly tachycardic as well.  X-rays showed significant air in the soft tissues on the medial aspect of his ankle concerning for necrotizing fasciitis.  Orthopedic surgery was urgently consulted to address this.  The general medicine service is also been consulted for medical management due to the potential for sepsis.  The patient's wife is with him.  She had encouraged him to seek medical treatment for his left foot issues but he has been reluctant to do so until the last 24 hours when he developed severe left ankle pain and swelling.  Since being in the emergency room, his wife reports that it has gotten significantly worse.  No past medical history on file.  No family history on file.  Social History:  has no history on file for tobacco, alcohol, and drug.  Allergies: No Known Allergies  Medications: I have reviewed the patient's current medications.  Results for orders placed or performed during the hospital encounter of 08/01/18 (from the past 48 hour(s))  Comprehensive metabolic panel     Status: Abnormal   Collection Time: 08/01/18  5:51 PM  Result Value Ref Range   Sodium 135 135 - 145 mmol/L   Potassium 3.3 (L) 3.5 - 5.1 mmol/L   Chloride 94 (L) 98 - 111 mmol/L   CO2 26 22 - 32 mmol/L   Glucose, Bld 103 (H) 70 - 99 mg/dL   BUN 29 (H) 8 - 23 mg/dL   Creatinine, Ser 8.46 0.61 - 1.24 mg/dL   Calcium 8.7 (L) 8.9 -  10.3 mg/dL   Total Protein 7.7 6.5 - 8.1 g/dL   Albumin 3.0 (L) 3.5 - 5.0 g/dL   AST 38 15 - 41 U/L   ALT 30 0 - 44 U/L   Alkaline Phosphatase 89 38 - 126 U/L   Total Bilirubin 1.7 (H) 0.3 - 1.2 mg/dL   GFR calc non Af Amer >60 >60 mL/min   GFR calc Af Amer >60 >60 mL/min   Anion gap 15 5 - 15    Comment: Performed at [facility], [address], [place], [place] [address]  CBC with Differential     Status: Abnormal   Collection Time: 08/01/18  5:51 PM  Result Value Ref Range   WBC 28.3 (H) 4.0 - 10.5 K/uL   RBC 4.82 4.22 - 5.81 MIL/uL   Hemoglobin 14.4 13.0 - 17.0 g/dL   HCT 57.0 17.7 - 93.9 %   MCV 93.2 80.0 - 100.0 fL   MCH 29.9 26.0 - 34.0 pg   MCHC 32.1 30.0 - 36.0 g/dL   RDW 03.0 09.2 - 33.0 %   Platelets 439 (H) 150 - 400 K/uL   nRBC 0.0 0.0 - 0.2 %   Neutrophils Relative % 83 %   Neutro Abs 23.5 (H) 1.7 - 7.7 K/uL   Lymphocytes Relative  4 %   Lymphs Abs 1.2 0.7 - 4.0 K/uL   Monocytes Relative 9 %   Monocytes Absolute 2.4 (H) 0.1 - 1.0 K/uL   Eosinophils Relative 0 %   Eosinophils Absolute 0.0 0.0 - 0.5 K/uL   Basophils Relative 1 %   Basophils Absolute 0.1 0.0 - 0.1 K/uL   RBC Morphology MORPHOLOGY UNREMARKABLE    Immature Granulocytes 3 %   Abs Immature Granulocytes 0.97 (H) 0.00 - 0.07 K/uL    Comment: Performed at [facility], [address] [place], [place] [address]  Protime-INR     Status: None   Collection Time: 08/01/18  5:51 PM  Result Value Ref Range   Prothrombin Time 15.0 11.4 - 15.2 seconds   INR 1.19     Comment: Performed at [facility], [address] [place], [place] [address]  I-Stat CG4 Lactic Acid, ED     Status: None   Collection Time: 08/01/18  5:58 PM  Result Value Ref Range   Lactic Acid, Venous 1.59 0.5 - 1.9 mmol/L    Dg Ankle Complete Left  Result Date: 08/01/2018 CLINICAL DATA:  Left foot wound. EXAM: LEFT ANKLE COMPLETE - 3+ VIEW COMPARISON:  None. FINDINGS: Soft  tissue air identified in the medial left ankle and medial left forefoot. There is question osteopenia of the distal aspect of the talus. IMPRESSION: Soft tissue air identified in the medial left ankle medial left forefoot. There is questioned osteopenia in the distal aspect of the talus suspicious for osteomyelitis. Electronically Signed   By: Sherian ReinWei-Chen  Lin M.D.   On: 08/01/2018 19:24   Dg Foot Complete Left  Result Date: 08/01/2018 CLINICAL DATA:  Left foot wound. EXAM: LEFT FOOT - COMPLETE 3+ VIEW COMPARISON:  None. FINDINGS: There is osteopenia of distal aspect of the first metatarsal and proximal aspect of the first proximal phalanx suspicious for osteomyelitis. Surrounding soft tissue air is identified. There is soft tissue air identified in the medial left forefoot. There is question osteopenia of the subjacent distal talus, osteomyelitis is not excluded. IMPRESSION: Findings suspicious for osteomyelitis of the distal aspect of the first metatarsal and proximal aspect of the first proximal phalanx. Question osteopenia in the distal talus subjacent to the soft tissue air, osteomyelitis is not excluded. Electronically Signed   By: Sherian ReinWei-Chen  Lin M.D.   On: 08/01/2018 19:22    ROS Blood pressure (!) 146/69, pulse 87, temperature 98.5 F (36.9 C), temperature source Oral, resp. rate (!) 25, height 6' (1.829 m), weight 72.6 kg, SpO2 99 %. Physical Exam  Constitutional: He is oriented to person, place, and time. He appears well-developed and well-nourished.  HENT:  Head: Normocephalic.  Eyes: Pupils are equal, round, and reactive to light.  Neck: Normal range of motion.  Cardiovascular: Tachycardia present.  Respiratory: Effort normal.  GI: Soft.  Musculoskeletal:       Feet:  Neurological: He is alert and oriented to person, place, and time.  Psychiatric: He has a normal mood and affect.    Assessment/Plan: Left ankle and foot infection with necrotic second toe as well as clinical findings  concerning for necrotizing fasciitis.  I spoke to the patient and his wife in length.  He needs an emergent irrigation and debridement of his right medial ankle given the crepitation and air in the soft tissue that is certainly concerning for necrotizing fasciitis.  Given the necrotic second toe we should remove this while he is in the operating room and  debride the great toe ulcer/necrotic wound.  We will also need to cut back his toenails as well.  He understands that he will likely need further surgeries and worse case scenario would be a below-knee amputation.  He is going to need IV antibiotics following the surgery and admission to the medicine service due to the possibility of sepsis and given his other medical issues with ALS.  The risk and benefits of surgery were explained in detail and informed consent is obtained.  Kathryne Hitch 08/01/2018, 9:47 PM

## 2018-08-01 NOTE — Transfer of Care (Signed)
Immediate Anesthesia Transfer of Care Note  Patient: Bruce Deleon  Procedure(s) Performed: IRRIGATION AND DEBRIDEMENT foot and ankle amputation first and second rays (Left Foot)  Patient Location: PACU  Anesthesia Type:General  Level of Consciousness: drowsy  Airway & Oxygen Therapy: Patient Spontanous Breathing and Patient connected to face mask oxygen  Post-op Assessment: Report given to RN and Post -op Vital signs reviewed and stable  Post vital signs: Reviewed and stable  Last Vitals:  Vitals Value Taken Time  BP 111/63 08/01/2018 11:33 PM  Temp    Pulse 71 08/01/2018 11:36 PM  Resp 20 08/01/2018 11:36 PM  SpO2 100 % 08/01/2018 11:36 PM  Vitals shown include unvalidated device data.  Last Pain:  Vitals:   08/01/18 1922  TempSrc:   PainSc: 2          Complications: No apparent anesthesia complications

## 2018-08-02 ENCOUNTER — Encounter (HOSPITAL_COMMUNITY): Payer: Self-pay

## 2018-08-02 ENCOUNTER — Other Ambulatory Visit: Payer: Self-pay

## 2018-08-02 LAB — TYPE AND SCREEN
ABO/RH(D): A POS
Antibody Screen: NEGATIVE

## 2018-08-02 LAB — CBC WITH DIFFERENTIAL/PLATELET
Abs Immature Granulocytes: 1.06 10*3/uL — ABNORMAL HIGH (ref 0.00–0.07)
Basophils Absolute: 0.1 10*3/uL (ref 0.0–0.1)
Basophils Relative: 0 %
Eosinophils Absolute: 0 10*3/uL (ref 0.0–0.5)
Eosinophils Relative: 0 %
HCT: 39.2 % (ref 39.0–52.0)
Hemoglobin: 12.4 g/dL — ABNORMAL LOW (ref 13.0–17.0)
Immature Granulocytes: 4 %
Lymphocytes Relative: 2 %
Lymphs Abs: 0.6 10*3/uL — ABNORMAL LOW (ref 0.7–4.0)
MCH: 30.3 pg (ref 26.0–34.0)
MCHC: 31.6 g/dL (ref 30.0–36.0)
MCV: 95.8 fL (ref 80.0–100.0)
Monocytes Absolute: 1.3 10*3/uL — ABNORMAL HIGH (ref 0.1–1.0)
Monocytes Relative: 5 %
NEUTROS PCT: 89 %
Neutro Abs: 24.9 10*3/uL — ABNORMAL HIGH (ref 1.7–7.7)
Platelets: 391 10*3/uL (ref 150–400)
RBC: 4.09 MIL/uL — ABNORMAL LOW (ref 4.22–5.81)
RDW: 13.3 % (ref 11.5–15.5)
WBC: 27.9 10*3/uL — ABNORMAL HIGH (ref 4.0–10.5)
nRBC: 0 % (ref 0.0–0.2)

## 2018-08-02 LAB — BASIC METABOLIC PANEL
Anion gap: 10 (ref 5–15)
BUN: 27 mg/dL — ABNORMAL HIGH (ref 8–23)
CALCIUM: 7.8 mg/dL — AB (ref 8.9–10.3)
CO2: 20 mmol/L — ABNORMAL LOW (ref 22–32)
Chloride: 107 mmol/L (ref 98–111)
Creatinine, Ser: 0.77 mg/dL (ref 0.61–1.24)
GFR calc Af Amer: >60 mL/min (ref >60)
GFR calc non Af Amer: >60 mL/min (ref >60)
Glucose, Bld: 108 mg/dL — ABNORMAL HIGH (ref 70–99)
Potassium: 3.7 mmol/L (ref 3.5–5.1)
Sodium: 137 mmol/L (ref 135–145)

## 2018-08-02 LAB — ABO/RH: ABO/RH(D): A POS

## 2018-08-02 LAB — MAGNESIUM: Magnesium: 2.4 mg/dL (ref 1.7–2.4)

## 2018-08-02 MED ORDER — OXYCODONE HCL 5 MG PO TABS
5.0000 mg | ORAL_TABLET | ORAL | Status: DC | PRN
  Administered 2018-08-02: 10 mg via ORAL
  Administered 2018-08-02: 5 mg via ORAL
  Administered 2018-08-02 – 2018-08-10 (×31): 10 mg via ORAL
  Filled 2018-08-02 (×10): qty 2
  Filled 2018-08-02: qty 1
  Filled 2018-08-02 (×24): qty 2

## 2018-08-02 MED ORDER — HYDROMORPHONE HCL 1 MG/ML IJ SOLN
1.0000 mg | INTRAMUSCULAR | Status: DC | PRN
  Administered 2018-08-03 – 2018-08-09 (×8): 1 mg via INTRAVENOUS
  Filled 2018-08-02 (×8): qty 1

## 2018-08-02 MED ORDER — VANCOMYCIN HCL IN DEXTROSE 1-5 GM/200ML-% IV SOLN
1000.0000 mg | Freq: Two times a day (BID) | INTRAVENOUS | Status: DC
  Administered 2018-08-02 – 2018-08-04 (×5): 1000 mg via INTRAVENOUS
  Filled 2018-08-02 (×5): qty 200

## 2018-08-02 MED ORDER — ALUM & MAG HYDROXIDE-SIMETH 200-200-20 MG/5ML PO SUSP
30.0000 mL | ORAL | Status: DC | PRN
  Administered 2018-08-02 – 2018-08-06 (×4): 30 mL via ORAL
  Filled 2018-08-02 (×4): qty 30

## 2018-08-02 NOTE — Progress Notes (Signed)
Patient ID: Bruce Deleon, male   DOB: [birth year], 63 y.o.   MRN: [id number] Awake and alert this morning.  Pain well controlled.  His left foot dressing is clean and dry.  His vitals are stable and he is non-septic appearing.  The infection involving his left ankle and foot was quite extensive.  His WBC is still significantly elevated.  May require a repeat I&D in 48 hours if does not improve.  Will check on him again tomorrow am.  Will need to be non-weight bearing on his left foot.

## 2018-08-02 NOTE — Progress Notes (Signed)
Pharmacy Antibiotic Note  Bruce Deleon is a 63 y.o. male admitted on 08/01/2018 with Wound Infection.  Pharmacy has been consulted for Vancomycin dosing.  Plan: Zosyn 3.375g IV Q8H infused over 4hrs.  Vancomycin 1500mg  iv x1, then Vancomycin 1000 mg IV Q 12 hrs. Goal AUC 400-550. Expected AUC: 454 SCr used: 0.8   Height: 6' (182.9 cm) Weight: 148 lb 13 oz (67.5 kg) IBW/kg (Calculated) : 77.6  Temp (24hrs), Avg:98.2 F (36.8 C), Min:97.9 F (36.6 C), Max:98.5 F (36.9 C)  Recent Labs  Lab 08/01/18 1751 08/01/18 1758 08/02/18 0143  WBC 28.3*  --  27.9*  CREATININE 0.90  --  0.77  LATICACIDVEN  --  1.59  --     Estimated Creatinine Clearance: 90.2 mL/min (by C-G formula based on SCr of 0.77 mg/dL).    No Known Allergies  Antimicrobials this admission: Vancomycin 08/01/2018 >> Zosyn 08/01/2018 >> Clindamycin 08/01/2018 >>  Dose adjustments this admission: -  Microbiology results: -  Thank you for allowing pharmacy to be a part of this patient's care.  Bruce Deleon 08/02/2018 4:14 AM

## 2018-08-02 NOTE — Evaluation (Signed)
Physical Therapy Evaluation Patient Details Name: Bruce Deleon MRN: [id number] DOB: 11-Nov-1955 Today's Date: 08/02/2018   History of Present Illness   63 yo male admitted 08/01/18 with Left ankle and foot infection with necrotizing fasciitis and necrotic first and second rays; s/p L 1st and 2nd ray amp on 08/01/18 per Dr Magnus Ivan. PMHx: ALS, COPD, HTN  Clinical Impression  Pt admitted with above diagnosis. Pt currently with functional limitations due to the deficits listed below (see PT Problem List).  Pt limited d/t baseline weakness secondary to ALS, and now NWB LLE d/t amputation,  likely will need SNF post acute  Pt will benefit from skilled PT to increase their independence and safety with mobility to allow discharge to the venue listed below.       Follow Up Recommendations SNF    Equipment Recommendations  Hospital bed(? TBA)    Recommendations for Other Services       Precautions / Restrictions Precautions Precautions: Fall Restrictions Weight Bearing Restrictions: Yes LLE Weight Bearing: Non weight bearing      Mobility  Bed Mobility Overal bed mobility: Needs Assistance Bed Mobility: Supine to Sit;Sit to Supine     Supine to sit: Min assist Sit to supine: Min assist   General bed mobility comments: assist with trunk to upright, assist to bring LEs onto bed; incr time  Transfers Overall transfer level: Needs assistance Equipment used: Rolling walker (2 wheeled) Transfers: Sit to/from Stand Sit to Stand: Mod assist;From elevated surface         General transfer comment: assist to rise and stabilize, able to maintain NWB however unable to wt shift d/t UE weakness   Ambulation/Gait             General Gait Details: NT  Stairs            Wheelchair Mobility    Modified Rankin (Stroke Patients Only)       Balance Overall balance assessment: Needs assistance Sitting-balance support: No upper extremity supported;Feet supported Sitting  balance-Leahy Scale: Fair       Standing balance-Leahy Scale: Poor Standing balance comment: reliant on UEs and external support                              Pertinent Vitals/Pain Pain Assessment: No/denies pain    Home Living Family/patient expects to be discharged to:: Private residence Living Arrangements: Spouse/significant other;Parent Available Help at Discharge: Family Type of Home: House Home Access: Stairs to enter   Secretary/administrator of Steps: 2 Home Layout: One level Home Equipment: Environmental consultant - 2 wheels;Wheelchair - manual Additional Comments: pt reports that his wife and his aunt (that lives nearby) assist with errands, meals, laundry etc    Prior Function Level of Independence: Independent with assistive device(s)         Comments: amb with RW until last few wks     Hand Dominance        Extremity/Trunk Assessment   Upper Extremity Assessment Upper Extremity Assessment: Defer to OT evaluation    Lower Extremity Assessment Lower Extremity Assessment: Generalized weakness;LLE deficits/detail LLE Deficits / Details: significant muscle atrophy throughout bil LEs, AROM grossly WFL; knee and hip grossly AROM grossly WFL bil LLE: Unable to fully assess due to immobilization       Communication   Communication: No difficulties  Cognition Arousal/Alertness: Awake/alert Behavior During Therapy: WFL for tasks assessed/performed Overall Cognitive Status: Within Functional Limits for tasks assessed  General Comments      Exercises     Assessment/Plan    PT Assessment Patient needs continued PT services  PT Problem List Decreased strength;Decreased mobility;Decreased balance;Decreased activity tolerance;Decreased knowledge of use of DME       PT Treatment Interventions DME instruction;Functional mobility training;Patient/family education;Gait training;Therapeutic  activities;Therapeutic exercise    PT Goals (Current goals can be found in the Care Plan section)  Acute Rehab PT Goals Patient Stated Goal: have ramp built at home PT Goal Formulation: With patient Time For Goal Achievement: 08/09/18 Potential to Achieve Goals: Fair    Frequency Min 2X/week   Barriers to discharge        Co-evaluation               AM-PAC PT "6 Clicks" Mobility  Outcome Measure Help needed turning from your back to your side while in a flat bed without using bedrails?: A Little Help needed moving from lying on your back to sitting on the side of a flat bed without using bedrails?: A Little Help needed moving to and from a bed to a chair (including a wheelchair)?: A Lot Help needed standing up from a chair using your arms (e.g., wheelchair or bedside chair)?: A Lot Help needed to walk in hospital room?: Total Help needed climbing 3-5 steps with a railing? : Total 6 Click Score: 12    End of Session Equipment Utilized During Treatment: Gait belt Activity Tolerance: Patient limited by fatigue Patient left: with call bell/phone within reach;with bed alarm set;in bed   PT Visit Diagnosis: Unsteadiness on feet (R26.81)    Time: 2778-2423 PT Time Calculation (min) (ACUTE ONLY): 20 min   Charges:   PT Evaluation $PT Eval Low Complexity: 1 Low        Drucilla Chalet, PT  Pager: [phone] Acute Rehab Dept [facility]): [phone]   08/02/2018    [facility] 08/02/2018, 1:35 PM

## 2018-08-02 NOTE — Op Note (Signed)
[name]: Ditmars, Bruce Deleon ACCOUNT [id number] DATE OF BIRTH:12/03/1955 FACILITY: [facility] LOCATION: [facility] PHYSICIAN:Natassja Ollis [name]. [name], MD  OPERATIVE REPORT  DATE OF PROCEDURE:  08/01/2018  PREOPERATIVE DIAGNOSIS:  Left ankle and foot infection with necrotizing fasciitis and necrotic first and second rays.  POSTOPERATIVE DIAGNOSIS:  Left ankle and foot infection with necrotizing fasciitis and necrotic first and second rays.  PROCEDURE: 1.  Extensive irrigation and debridement of left medial ankle and foot. 2.  First and second ray amputations of the left foot.  FINDINGS:  Gross purulence in the plantar aspect of the left foot and left medial ankle with necrotic first and second rays.  SURGEON:  Bruce PandaChristopher Y. Magnus IvanBlackman, MD  ASSISTANT:  Bruce CanalGilbert Clark, PA-C  ANESTHESIA:  General.  ANTIBIOTICS:  Vancomycin, Zosyn prior to surgery.  CULTURES:  Pending.  TOURNIQUET TIME:  Less than 1 hour.  COMPLICATIONS:  None.  INDICATIONS:  The patient is a 63 year old gentleman apparently with ALS who has had a necrotic wound on his left foot for over a month now.  I surmise it has probably been there longer than that.  There is a large half-dollar shaped necrotic wound at  his MTP joint of the first ray with gross purulence coming out from under this.  His second toe was necrotic.  His toenails are significantly deformed, and his great toenail crosses over the second toenail and is causing ischemic changes.  He has also  developed redness with swelling and crepitation of soft tissue on the medial ankle area with a white blood cell count of 28,000, and he is tachycardic.  His x-rays also are concerning for necrotizing fasciitis.  They are in the soft tissues along the  medial ankle and on crepitation on just physical exam.  This infection is worsening rapidly.  I have recommended emergent irrigation and debridement of the ankle and foot with likely removal of the 1st and  2nd rays to get this under control.  DESCRIPTION OF PROCEDURE:  After informed consent was obtained and appropriate left ankle and foot were marked, he was brought to the operating room and placed supine on the operating table.  General anesthesia was then obtained.  A nonsterile tourniquet  was placed around his upper left thigh, and his left knee, leg, foot and ankle were prepped and draped with Betadine and sterile drapes.  A time-out was called and he was identified as correct patient, correct left leg.  I then had the tourniquet  inflated to 250 mm of pressure.  We then made a wide incision over the medial aspect of the ankle and carried this proximally and distally, finding necrotic fascia.  Using a #10 blade, we excised necrotic fascia over a 7-12 cm area.  We then irrigated  this wound with normal saline solution using pulsatile lavage.  I then ellipsed the large eschar off of the medial aspect of his right foot at the first ray over the MTP joint, and there was gross purulence there tracking down to the bone with obvious  osteomyelitis of the metatarsal head and the base of the proximal phalanx.  With that being said, I needed to go ahead and amputate his first ray.  The second toe was severely necrotic.  I removed it as well.  I cut both metatarsals of the first and  second metatarsals back to stable bone.  With a scalpel, we were able to sharply excise and debride necrotic skin, fascia, soft tissue, and bone, also using a rongeur and  bone-cutting forceps.  I did find a plantar wound tracking throughout the whole  plantar fascia.  We had to remove some of the plantar fascia as well.  We then irrigated the wound again with normal saline solution using pulsatile lavage.  We then were able to reapproximate a flap of tissue over the resections from the first and  second ray as well as rearrange the soft tissue in this area.  We were able to loosely approximate the skin over the medial ankle.  A  Xeroform well-padded sterile dressing was applied.  The tourniquet was let down.  The remainder of his toes pinkened  nicely.  He was taken to the recovery room in stable, but certainly guarded position given the fact that he is on the verge of sepsis.  Of note, Bruce Canal PA-C's assistance was greatly appreciated and needed throughout the case.  LN/NUANCE  D:08/01/2018 T:08/02/2018 JOB:[id number]

## 2018-08-02 NOTE — Progress Notes (Signed)
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 on noninvasive ventilation with trilogy, HTN  Came to emergency room 1/19 chronic wounds over left lateral first MTP left second toe and new wound to left medial ankle with severe throbbing pain  On left medial malleolus found to have gas and soft tissue concerning for necrotizing fasciitis versus gas gangrene-orthopedics consulted placed on Vanco Zosyn and clindamycin  Labs on admission showed hypokalemia 3.3 albumin 3.0 lactic acid 1.5 WBC 28.3 hemoglobin 14  Underwent surgery   A & Plan Gas gangrene Appreciate orthopedic input continue clindamycin vancomycin + Zosyn but narrow soon IV wound will be reviewed by Dr. Magnus Ivan and may need to go for repeat debridement Labs in a.m. Needing skilled facility on discharge Pain control Dilaudid and oxycodone as first choice ALS on noninvasive ventilation-chronically wheelchair-bound Continue trilogy as per protocol Seems stable at this time HTN Not on meds at this time Severe malnutrition BMI 20  Lovenox Inpatient No family present Expect will be here through the end of the week  Mahala Menghini, MD  [facility] Via [employer] app OR [email or web address] 7PM-7AM contact night coverage as above 08/02/2018, 7:58 AM  LOS: 1 day   Consultants:  Orthopedics  Procedures:  Debridement on 1/20  Antimicrobials:  Clindamycin, Zosyn, vancomycin since 1/19 awake alert coherent not in much pain tolerating liquids but not hungry at this time no chest pain no fever  Interval history/Subjective: Seen in room Fair no distress Not hungry Pain mod controlled   Objective:  Vitals:  Vitals:   08/02/18 0035 08/02/18 0501  BP: 138/70 132/72  Pulse: 75 72  Resp: 20 18  Temp: 98 F (36.7 C) 98 F (36.7 C)  SpO2: 97% 97%     Exam:   eomi ncat no ict no pallor cta b LE wrapped in occlusive bandage abd soft s1 s 2no mrg--telel benign so dc'd   I have personally reviewed the following:  DATA   Labs:  Wbc 28.3-->27  Hglobin 14-->12  Bun /creat 29.0.9-->27/0,7  Scheduled Meds: . heparin  5,000 Units Subcutaneous Q8H   Continuous Infusions: . sodium chloride Stopped (08/02/18 0332)  . clindamycin (CLEOCIN) IV 100 mL/hr at 08/02/18 0600  . piperacillin-tazobactam (ZOSYN)  IV 12.5 mL/hr at 08/02/18 0600  . vancomycin 1,000 mg (08/02/18 0751)    Principal Problem:   Left foot and ankle infection Active Problems:   Osteomyelitis (HCC)   LOS: 1 day

## 2018-08-03 LAB — CBC WITH DIFFERENTIAL/PLATELET
Abs Immature Granulocytes: 1.16 10*3/uL — ABNORMAL HIGH (ref 0.00–0.07)
Basophils Absolute: 0.1 10*3/uL (ref 0.0–0.1)
Basophils Relative: 0 %
Eosinophils Absolute: 0 10*3/uL (ref 0.0–0.5)
Eosinophils Relative: 0 %
HCT: 42.7 % (ref 39.0–52.0)
Hemoglobin: 13.2 g/dL (ref 13.0–17.0)
IMMATURE GRANULOCYTES: 3 %
LYMPHS PCT: 4 %
Lymphs Abs: 1.3 10*3/uL (ref 0.7–4.0)
MCH: 29.5 pg (ref 26.0–34.0)
MCHC: 30.9 g/dL (ref 30.0–36.0)
MCV: 95.5 fL (ref 80.0–100.0)
Monocytes Absolute: 1.9 10*3/uL — ABNORMAL HIGH (ref 0.1–1.0)
Monocytes Relative: 5 %
Neutro Abs: 32.3 10*3/uL — ABNORMAL HIGH (ref 1.7–7.7)
Neutrophils Relative %: 88 %
Platelets: 447 10*3/uL — ABNORMAL HIGH (ref 150–400)
RBC: 4.47 MIL/uL (ref 4.22–5.81)
RDW: 13.5 % (ref 11.5–15.5)
WBC: 36.8 10*3/uL — ABNORMAL HIGH (ref 4.0–10.5)
nRBC: 0 % (ref 0.0–0.2)

## 2018-08-03 LAB — COMPREHENSIVE METABOLIC PANEL
ALBUMIN: 2.1 g/dL — AB (ref 3.5–5.0)
ALT: 23 U/L (ref 0–44)
AST: 24 U/L (ref 15–41)
Alkaline Phosphatase: 62 U/L (ref 38–126)
Anion gap: 9 (ref 5–15)
BUN: 21 mg/dL (ref 8–23)
CO2: 24 mmol/L (ref 22–32)
CREATININE: 0.65 mg/dL (ref 0.61–1.24)
Calcium: 7.9 mg/dL — ABNORMAL LOW (ref 8.9–10.3)
Chloride: 101 mmol/L (ref 98–111)
GFR calc Af Amer: >60 mL/min (ref >60)
GFR calc non Af Amer: >60 mL/min (ref >60)
GLUCOSE: 102 mg/dL — AB (ref 70–99)
Potassium: 3.6 mmol/L (ref 3.5–5.1)
Sodium: 134 mmol/L — ABNORMAL LOW (ref 135–145)
Total Bilirubin: 0.9 mg/dL (ref 0.3–1.2)
Total Protein: 6.2 g/dL — ABNORMAL LOW (ref 6.5–8.1)

## 2018-08-03 LAB — MAGNESIUM: MAGNESIUM: 2.4 mg/dL (ref 1.7–2.4)

## 2018-08-03 NOTE — Progress Notes (Signed)
Patient ID: Bruce Deleon, male   DOB: June 17, 1956, 63 y.o.   MRN: [id number] No acute changes.  Left foot dressing changed at the bedside.  The wounds look better overall.  There is no bad odor.  I did express fluid from the incisions, but bloody.  New dressing was placed.  I am concerned about his WBC having gone up to 36,000.  This may indicate that he needs a repeat I&D.  Will continue IV antibiotics for now.  He feels better overall and looks better and his vitals are stable.  Will re-assess in the morning tomorrow.

## 2018-08-03 NOTE — Evaluation (Signed)
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 PMHx: ALS, COPD, HTN   Clinical Impression   This 63 y/o male presents with the above. At baseline pt reports he is mod independent with ADL and functional mobility, most recently using RW PTA. Pt presenting with generalized weakness including baseline UE weakness and fine motor deficits. He currently requires minA for bed mobility with minguard assist for static sitting EOB; requires minA for UB ADL, maxA for LB ADL. Pt will benefit from continued acute OT services and recommend follow up therapy services in SNF setting after discharge to maximize his safety and independence with ADL and mobility. Will follow.     Follow Up Recommendations  SNF;Supervision/Assistance - 24 hour    Equipment Recommendations  3 in 1 bedside commode;Other (comment);Wheelchair (measurements OT);Wheelchair cushion (measurements OT)(TBD in next venue, may require drop arm BSC)           Precautions / Restrictions Precautions Precautions: Fall Restrictions Weight Bearing Restrictions: Yes LLE Weight Bearing: Non weight bearing      Mobility Bed Mobility Overal bed mobility: Needs Assistance Bed Mobility: Supine to Sit;Sit to Supine     Supine to sit: Min assist Sit to supine: Min assist   General bed mobility comments: pt slightly impulsive with attempts to transition to EOB, initially requesting to attempt to perform without assist, ultimately requires assist to support trunk upright; assist for LLE when returning to supine for increased safety  Transfers                      Balance Overall balance assessment: Needs assistance Sitting-balance support:  Single extremity supported;Feet supported Sitting balance-Leahy Scale: Fair Sitting balance - Comments: ultimately requires at least single UE support for sitting balance, noticed slight posterior lean during UE assessment                                   ADL either performed or assessed with clinical judgement   ADL Overall ADL's : Needs assistance/impaired Eating/Feeding: Set up;Sitting Eating/Feeding Details (indicate cue type and reason): pt reports significant difficulty with self-feeding, discussed option of using built up handle to increase ease of performing fine motor tasks Grooming: Set up;Sitting   Upper Body Bathing: Min guard;Sitting   Lower Body Bathing: Moderate assistance;Sitting/lateral leans   Upper Body Dressing : Minimal assistance;Sitting Upper Body Dressing Details (indicate cue type and reason): due to decreased fine motor Lower Body Dressing: Moderate assistance;Maximal assistance;Sitting/lateral leans                 General ADL Comments: pt performing bed mobility and sat EOB during session, pt with increased SOB and fatigue with performing bed mobility, (SpO2 monitored and 96% on RA); anticipate pt will require +2 for transfers due to weakness and NWB status      Vision         Perception     Praxis      Pertinent Vitals/Pain Pain Assessment: Faces Faces Pain Scale: Hurts little more Pain Location: LLE Pain Descriptors / Indicators: Discomfort;Sore Pain Intervention(s): Monitored during session;Premedicated before session;Repositioned     Hand Dominance Right  Extremity/Trunk Assessment Upper Extremity Assessment Upper Extremity Assessment: RUE deficits/detail;LUE deficits/detail RUE Deficits / Details: grossly 3/5 throughout shoulder and elbow, significant weakness due to baseline ALS, decreased digit ROM and weak grip RUE Coordination: decreased fine motor LUE Deficits / Details: grossly 3/5 throughout, digit ROM and  grip strength better in LUE compared to RUE LUE Coordination: decreased fine motor   Lower Extremity Assessment Lower Extremity Assessment: Defer to PT evaluation       Communication Communication Communication: No difficulties   Cognition Arousal/Alertness: Awake/alert Behavior During Therapy: WFL for tasks assessed/performed Overall Cognitive Status: Within Functional Limits for tasks assessed                                     General Comments       Exercises     Shoulder Instructions      Home Living Family/patient expects to be discharged to:: Private residence Living Arrangements: Spouse/significant other;Parent Available Help at Discharge: Family Type of Home: House Home Access: Stairs to enter Secretary/administrator of Steps: 2   Home Layout: One level     Bathroom Shower/Tub: Chief Strategy Officer: Standard     Home Equipment: Environmental consultant - 2 wheels;Wheelchair - manual   Additional Comments: pt reports that his wife and his aunt (that lives nearby) assist with errands, meals, laundry etc; reports he has most recently been staying with his mom to assist her       Prior Functioning/Environment Level of Independence: Independent with assistive device(s)        Comments: amb with RW until last few wks        OT Problem List: Decreased strength;Decreased range of motion;Impaired balance (sitting and/or standing);Decreased activity tolerance;Pain;Decreased knowledge of use of DME or AE      OT Treatment/Interventions: Self-care/ADL training;Therapeutic exercise;DME and/or AE instruction;Patient/family education;Balance training    OT Goals(Current goals can be found in the care plan section) Acute Rehab OT Goals Patient Stated Goal: have ramp built at home OT Goal Formulation: With patient Time For Goal Achievement: 08/17/18 Potential to Achieve Goals: Good  OT Frequency: Min 2X/week   Barriers to D/C:             Co-evaluation              AM-PAC OT "6 Clicks" Daily Activity     Outcome Measure Help from another person eating meals?: A Little Help from another person taking care of personal grooming?: A Little Help from another person toileting, which includes using toliet, bedpan, or urinal?: Total Help from another person bathing (including washing, rinsing, drying)?: A Lot Help from another person to put on and taking off regular upper body clothing?: A Little Help from another person to put on and taking off regular lower body clothing?: A Lot 6 Click Score: 14   End of Session Nurse Communication: Mobility status  Activity Tolerance: Patient tolerated treatment well Patient left: in bed;with call bell/phone within reach;with bed alarm set  OT Visit Diagnosis: Other abnormalities of gait and mobility (R26.89);Muscle weakness (generalized) (M62.81)                Time: 1014-1030 OT Time Calculation (min): 16 min Charges:  OT General Charges $OT Visit: 1 Visit OT Evaluation $OT Eval Moderate Complexity: 1 Mod  Marcy Siren, OT [employer] Pager [phone] Office [phone]   Orlando Penner 08/03/2018, 12:56  PM

## 2018-08-03 NOTE — Anesthesia Postprocedure Evaluation (Signed)
Anesthesia Post Note  Patient: Osamah Posten  Procedure(s) Performed: IRRIGATION AND DEBRIDEMENT foot and ankle amputation first and second rays (Left Foot)     Patient location during evaluation: PACU Anesthesia Type: General Level of consciousness: awake and alert Pain management: pain level controlled Vital Signs Assessment: post-procedure vital signs reviewed and stable Respiratory status: spontaneous breathing, nonlabored ventilation, respiratory function stable and patient connected to nasal cannula oxygen Cardiovascular status: blood pressure returned to baseline and stable Postop Assessment: no apparent nausea or vomiting Anesthetic complications: no    Last Vitals:  Vitals:   08/02/18 1330 08/03/18 0433  BP: 129/70 (!) 136/93  Pulse: 77 78  Resp: 18 20  Temp: 36.9 C 36.7 C  SpO2: 99% 96%    Last Pain:  Vitals:   08/03/18 0448  TempSrc:   PainSc: Asleep                 Beryle Lathe

## 2018-08-03 NOTE — Clinical Social Work Note (Signed)
Clinical Social Work Assessment  Patient Details  Name: Bruce Deleon MRN: [id number] Date of Birth: 08/08/1955  Date of referral:  08/03/18               Reason for consult:  Facility Placement                Permission sought to share information with:    Permission granted to share information::     Name::        Agency::     Relationship::     Contact Information:     Housing/Transportation Living arrangements for the past 2 months:  Single Family Home Source of Information:  Patient Patient Interpreter Needed:  None Criminal Activity/Legal Involvement Pertinent to Current Situation/Hospitalization:  No - Comment as needed Significant Relationships:  Spouse, Parents Lives with:  Self, Parents Do you feel safe going back to the place where you live?  Yes Need for family participation in patient care:  No (Coment)  Care giving concerns:  Pt admitted from home where he resides with his elderly mother (Assists with supervising her care). Wife lives near [place] where pt's actual home is however he reports living in [place] to help his mother for several years. Pt admitted for infection in left foot, required immediate surgery, had second toe amputated. May need repeat I&D. Also possibility of further surgery in future per providers. Pt has ALS and at baseline cares for self and ambulates with walker. Drives and handles mother's care as well.   Social Worker assessment / plan:  CSW consulted to assist with disposition- pt will be needing short term rehab at [place] following surgery- is NWB on left foot. Pt familiar with nature of treatment and goals of short term rehab at SNF due to his mother having gone last year. Pt agreeable to this plan for himself. CSW made referrals- will complete FL2 closer to DC so that care needs will be most updated on document. Pt will need [employer] prior authorization to admit to SNF.  Employment status:  Retired, Disabled (Comment on whether or not  currently receiving Disability)(recieves disability) Insurance information:  S[employer] broker) PT Recommendations:  Skilled Nursing Facility Information / Referral to community resources:  Skilled Nursing Facility  Patient/Family's Response to care:  Pt appreciative  Patient/Family's Understanding of and Emotional Response to Diagnosis, Current Treatment, and Prognosis:  Pt shows good understanding of the treatment he has received and care needs going forward. Pt explains having not sought treatment for infection earlier thinking wound would heal on its own.   Emotional Assessment Appearance:  Appears stated age Attitude/Demeanor/Rapport:  Engaged Affect (typically observed):  Accepting, Calm, Adaptable, Pleasant Orientation:  Oriented to Self, Oriented to Place, Oriented to  Time, Oriented to Situation Alcohol / Substance use:  Not Applicable Psych involvement (Current and /or in the community):  No (Comment)  Discharge Needs  Concerns to be addressed:  Discharge Planning Concerns Readmission within the last 30 days:  No Current discharge risk:    Barriers to Discharge:  Continued Medical Work up   [employer], LCSW 08/03/2018, 2:15 PM [phone]

## 2018-08-03 NOTE — Progress Notes (Addendum)
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 on noninvasive ventilation with trilogy, HTN  Came to emergency room 1/19 chronic wounds over left lateral first MTP left second toe and new wound to left medial ankle with severe throbbing pain  On left medial malleolus found to have gas and soft tissue concerning for necrotizing fasciitis versus gas gangrene-orthopedics consulted placed on Vanco Zosyn and clindamycin  Labs on admission showed hypokalemia 3.3 albumin 3.0 lactic acid 1.5 WBC 28.3 hemoglobin 14  Underwent surgery--however wounds to be reassessed on 1:22 AM and need for further surgery defer to orthopedics   A & Plan Gas gangrene Appreciate orthopedic input continue I did narrow off of clindamycin but will continue Vanco and Zosyn for gram-positive gram-negative and some anaerobic coverage Leukocytosis may be postop but wound to be reviewed as per Dr. Magnus Ivan Further management as per orthopedics in terms of need for amputation or further I&D at their next visit appreciate input Labs in a.m. Needing skilled facility on discharge Pain control Dilaudid and oxycodone as first choice it is relatively controlled ALS on noninvasive ventilation-chronically wheelchair-bound Continue trilogy as per protocol Seems stable at this time HTN Not on meds at this time Severe malnutrition BMI 20  Lovenox Inpatient Discussed in detail with wife who had no questions and was given opportunity to answer the same Expect will be here through the end of the week  Mahala Menghini, MD  [facility] Via [employer] app OR [email or web address] 7PM-7AM contact night coverage as above 08/03/2018, 10:31 AM  LOS: 2 days   Consultants:  Orthopedics  Procedures:  Debridement on 1/20  Antimicrobials:  Clindamycin discontinued 1/21  continuing, Zosyn, vancomycin since 1/19   Interval history/Subjective:  awake alert coherent not in much pain tolerating liquids does not want to eat because he is afraid of passing stool and unable to maneuver himself  Objective:  Vitals:  Vitals:   08/02/18 1330 08/03/18 0433  BP: 129/70 (!) 136/93  Pulse: 77 78  Resp: 18 20  Temp: 98.5 F (36.9 C) 98.1 F (36.7 C)  SpO2: 99% 96%    Exam:  Alert frail appearing pleasant slightly disheveled no distress EOMI NCAT Abdomen soft no rebound no guarding Left lower extremity is wrapped in a pressure bandage Abdomen is soft   I have personally reviewed the following:  DATA   Labs:  Wbc 28-36  Hglobin stable 12-14 range  Bun /creat stable in prior ranges  Scheduled Meds: . heparin  5,000 Units Subcutaneous Q8H   Continuous Infusions: . sodium chloride Stopped (08/03/18 0801)  . piperacillin-tazobactam (ZOSYN)  IV 3.375 g (08/03/18 0647)  . vancomycin 200 mL/hr at 08/03/18 0805    Principal Problem:   Left foot and ankle infection Active Problems:   Osteomyelitis (HCC)   LOS: 2 days

## 2018-08-04 ENCOUNTER — Encounter (HOSPITAL_COMMUNITY)
Admission: EM | Disposition: A | Discharge status: Skilled Nursing Facility | Payer: Self-pay | Source: Home / Self Care | Attending: Student

## 2018-08-04 ENCOUNTER — Encounter (HOSPITAL_COMMUNITY): Payer: Self-pay | Admitting: Certified Registered"

## 2018-08-04 ENCOUNTER — Inpatient Hospital Stay (HOSPITAL_COMMUNITY): Payer: Medicare Other | Admitting: Anesthesiology

## 2018-08-04 HISTORY — PX: APPLICATION OF WOUND VAC: SHX5189

## 2018-08-04 HISTORY — PX: I & D EXTREMITY: SHX5045

## 2018-08-04 LAB — RENAL FUNCTION PANEL
ALBUMIN: 2.1 g/dL — AB (ref 3.5–5.0)
Anion gap: 10 (ref 5–15)
BUN: 17 mg/dL (ref 8–23)
CO2: 22 mmol/L (ref 22–32)
Calcium: 7.9 mg/dL — ABNORMAL LOW (ref 8.9–10.3)
Chloride: 105 mmol/L (ref 98–111)
Creatinine, Ser: 0.66 mg/dL (ref 0.61–1.24)
GFR calc Af Amer: >60 mL/min (ref >60)
GFR calc non Af Amer: >60 mL/min (ref >60)
GLUCOSE: 83 mg/dL (ref 70–99)
PHOSPHORUS: 2.1 mg/dL — AB (ref 2.5–4.6)
Potassium: 3.4 mmol/L — ABNORMAL LOW (ref 3.5–5.1)
Sodium: 137 mmol/L (ref 135–145)

## 2018-08-04 LAB — CBC WITH DIFFERENTIAL/PLATELET
Abs Immature Granulocytes: 0.56 10*3/uL — ABNORMAL HIGH (ref 0.00–0.07)
Basophils Absolute: 0.1 10*3/uL (ref 0.0–0.1)
Basophils Relative: 1 %
Eosinophils Absolute: 0.1 10*3/uL (ref 0.0–0.5)
Eosinophils Relative: 0 %
HCT: 40.8 % (ref 39.0–52.0)
Hemoglobin: 12.8 g/dL — ABNORMAL LOW (ref 13.0–17.0)
Immature Granulocytes: 3 %
LYMPHS ABS: 1.5 10*3/uL (ref 0.7–4.0)
Lymphocytes Relative: 7 %
MCH: 29.9 pg (ref 26.0–34.0)
MCHC: 31.4 g/dL (ref 30.0–36.0)
MCV: 95.3 fL (ref 80.0–100.0)
Monocytes Absolute: 1.3 10*3/uL — ABNORMAL HIGH (ref 0.1–1.0)
Monocytes Relative: 6 %
Neutro Abs: 17.6 10*3/uL — ABNORMAL HIGH (ref 1.7–7.7)
Neutrophils Relative %: 83 %
Platelets: 473 10*3/uL — ABNORMAL HIGH (ref 150–400)
RBC: 4.28 MIL/uL (ref 4.22–5.81)
RDW: 13.7 % (ref 11.5–15.5)
WBC: 21.1 10*3/uL — ABNORMAL HIGH (ref 4.0–10.5)
nRBC: 0 % (ref 0.0–0.2)

## 2018-08-04 LAB — MAGNESIUM: Magnesium: 2.4 mg/dL (ref 1.7–2.4)

## 2018-08-04 SURGERY — IRRIGATION AND DEBRIDEMENT EXTREMITY
Anesthesia: General | Site: Foot | Laterality: Left

## 2018-08-04 MED ORDER — BUPIVACAINE HCL (PF) 0.5 % IJ SOLN
INTRAMUSCULAR | Status: AC
  Filled 2018-08-04: qty 30

## 2018-08-04 MED ORDER — ROCURONIUM BROMIDE 100 MG/10ML IV SOLN
INTRAVENOUS | Status: AC
  Filled 2018-08-04: qty 1

## 2018-08-04 MED ORDER — FENTANYL CITRATE (PF) 100 MCG/2ML IJ SOLN
INTRAMUSCULAR | Status: AC
  Administered 2018-08-04: 25 ug via INTRAVENOUS
  Filled 2018-08-04: qty 2

## 2018-08-04 MED ORDER — SODIUM CHLORIDE 0.9 % IV SOLN
INTRAVENOUS | Status: AC
  Filled 2018-08-04: qty 500000

## 2018-08-04 MED ORDER — ONDANSETRON HCL 4 MG/2ML IJ SOLN
INTRAMUSCULAR | Status: DC | PRN
  Administered 2018-08-04: 4 mg via INTRAVENOUS

## 2018-08-04 MED ORDER — PHENYLEPHRINE 40 MCG/ML (10ML) SYRINGE FOR IV PUSH (FOR BLOOD PRESSURE SUPPORT)
PREFILLED_SYRINGE | INTRAVENOUS | Status: AC
  Filled 2018-08-04: qty 10

## 2018-08-04 MED ORDER — HYDRALAZINE HCL 20 MG/ML IJ SOLN
10.0000 mg | INTRAMUSCULAR | Status: DC | PRN
  Administered 2018-08-04 – 2018-08-06 (×2): 10 mg via INTRAVENOUS
  Filled 2018-08-04 (×2): qty 1

## 2018-08-04 MED ORDER — DEXAMETHASONE SODIUM PHOSPHATE 10 MG/ML IJ SOLN
INTRAMUSCULAR | Status: DC | PRN
  Administered 2018-08-04: 5 mg via INTRAVENOUS

## 2018-08-04 MED ORDER — FENTANYL CITRATE (PF) 100 MCG/2ML IJ SOLN
INTRAMUSCULAR | Status: AC
  Administered 2018-08-04: 50 ug via INTRAVENOUS
  Filled 2018-08-04: qty 2

## 2018-08-04 MED ORDER — ALBUMIN HUMAN 5 % IV SOLN
INTRAVENOUS | Status: AC
  Filled 2018-08-04: qty 250

## 2018-08-04 MED ORDER — MIDAZOLAM HCL 2 MG/2ML IJ SOLN
INTRAMUSCULAR | Status: AC
  Filled 2018-08-04: qty 2

## 2018-08-04 MED ORDER — PROPOFOL 10 MG/ML IV BOLUS
INTRAVENOUS | Status: DC | PRN
  Administered 2018-08-04 (×2): 10 mg via INTRAVENOUS
  Administered 2018-08-04: 150 mg via INTRAVENOUS
  Administered 2018-08-04 (×3): 10 mg via INTRAVENOUS

## 2018-08-04 MED ORDER — ESMOLOL HCL 100 MG/10ML IV SOLN
INTRAVENOUS | Status: AC
  Filled 2018-08-04: qty 10

## 2018-08-04 MED ORDER — ONDANSETRON HCL 4 MG/2ML IJ SOLN
4.0000 mg | Freq: Four times a day (QID) | INTRAMUSCULAR | Status: DC | PRN
  Administered 2018-08-04: 4 mg via INTRAVENOUS
  Filled 2018-08-04: qty 2

## 2018-08-04 MED ORDER — LIDOCAINE 2% (20 MG/ML) 5 ML SYRINGE
INTRAMUSCULAR | Status: DC | PRN
  Administered 2018-08-04: 20 mg via INTRAVENOUS

## 2018-08-04 MED ORDER — SODIUM CHLORIDE 0.9 % IV SOLN
INTRAVENOUS | Status: DC | PRN
  Administered 2018-08-04: 500 mL

## 2018-08-04 MED ORDER — ONDANSETRON HCL 4 MG/2ML IJ SOLN
INTRAMUSCULAR | Status: AC
  Filled 2018-08-04: qty 4

## 2018-08-04 MED ORDER — PHENYLEPHRINE 40 MCG/ML (10ML) SYRINGE FOR IV PUSH (FOR BLOOD PRESSURE SUPPORT)
PREFILLED_SYRINGE | INTRAVENOUS | Status: DC | PRN
  Administered 2018-08-04: 120 ug via INTRAVENOUS

## 2018-08-04 MED ORDER — PROPOFOL 10 MG/ML IV BOLUS
INTRAVENOUS | Status: AC
  Filled 2018-08-04: qty 20

## 2018-08-04 MED ORDER — FENTANYL CITRATE (PF) 100 MCG/2ML IJ SOLN
25.0000 ug | INTRAMUSCULAR | Status: DC | PRN
  Administered 2018-08-04 (×2): 25 ug via INTRAVENOUS
  Administered 2018-08-04 (×2): 50 ug via INTRAVENOUS

## 2018-08-04 MED ORDER — AMLODIPINE BESYLATE 5 MG PO TABS
5.0000 mg | ORAL_TABLET | Freq: Every day | ORAL | Status: DC
  Administered 2018-08-04 – 2018-08-10 (×7): 5 mg via ORAL
  Filled 2018-08-04 (×7): qty 1

## 2018-08-04 MED ORDER — FENTANYL CITRATE (PF) 100 MCG/2ML IJ SOLN
INTRAMUSCULAR | Status: AC
  Filled 2018-08-04: qty 2

## 2018-08-04 MED ORDER — SODIUM CHLORIDE 0.9 % IR SOLN
Status: DC | PRN
  Administered 2018-08-04: 3000 mL

## 2018-08-04 MED ORDER — POTASSIUM CHLORIDE CRYS ER 20 MEQ PO TBCR
40.0000 meq | EXTENDED_RELEASE_TABLET | Freq: Once | ORAL | Status: AC
  Administered 2018-08-04: 40 meq via ORAL
  Filled 2018-08-04: qty 2

## 2018-08-04 MED ORDER — FENTANYL CITRATE (PF) 100 MCG/2ML IJ SOLN
INTRAMUSCULAR | Status: DC | PRN
Start: 2018-08-04 — End: 2018-08-04
  Administered 2018-08-04 (×4): 25 ug via INTRAVENOUS

## 2018-08-04 MED ORDER — DEXAMETHASONE SODIUM PHOSPHATE 10 MG/ML IJ SOLN
INTRAMUSCULAR | Status: AC
  Filled 2018-08-04: qty 1

## 2018-08-04 MED ORDER — LACTATED RINGERS IV SOLN
INTRAVENOUS | Status: DC
  Administered 2018-08-04 – 2018-08-06 (×3): via INTRAVENOUS

## 2018-08-04 SURGICAL SUPPLY — 39 items
BAG ZIPLOCK 12X15 (MISCELLANEOUS) ×2 IMPLANT
BANDAGE ACE 4X5 VEL STRL LF (GAUZE/BANDAGES/DRESSINGS) IMPLANT
BANDAGE ACE 6X5 VEL STRL LF (GAUZE/BANDAGES/DRESSINGS) ×2 IMPLANT
BANDAGE ESMARK 6X9 LF (GAUZE/BANDAGES/DRESSINGS) ×1 IMPLANT
BNDG ESMARK 6X9 LF (GAUZE/BANDAGES/DRESSINGS) ×2
COVER SURGICAL LIGHT HANDLE (MISCELLANEOUS) ×2 IMPLANT
COVER WAND RF STERILE (DRAPES) IMPLANT
CUFF TOURN SGL QUICK 34 (TOURNIQUET CUFF) ×1
CUFF TRNQT CYL 34X4X40X1 (TOURNIQUET CUFF) ×1 IMPLANT
DRAPE EXTREMITY T 121X128X90 (DISPOSABLE) ×2 IMPLANT
DRSG KUZMA FLUFF (GAUZE/BANDAGES/DRESSINGS) IMPLANT
DRSG VAC ATS MED SENSATRAC (GAUZE/BANDAGES/DRESSINGS) ×2 IMPLANT
DURAPREP 26ML APPLICATOR (WOUND CARE) ×2 IMPLANT
ELECT REM PT RETURN 15FT ADLT (MISCELLANEOUS) ×2 IMPLANT
EVACUATOR 1/8 PVC DRAIN (DRAIN) ×2 IMPLANT
GAUZE SPONGE 4X4 12PLY STRL (GAUZE/BANDAGES/DRESSINGS) IMPLANT
GAUZE XEROFORM 5X9 LF (GAUZE/BANDAGES/DRESSINGS) ×2 IMPLANT
GLOVE BIO SURGEON STRL SZ7.5 (GLOVE) ×2 IMPLANT
GLOVE BIOGEL PI IND STRL 7.0 (GLOVE) ×2 IMPLANT
GLOVE BIOGEL PI IND STRL 8 (GLOVE) ×1 IMPLANT
GLOVE BIOGEL PI INDICATOR 7.0 (GLOVE) ×2
GLOVE BIOGEL PI INDICATOR 8 (GLOVE) ×1
GOWN STRL REUS W/TWL LRG LVL3 (GOWN DISPOSABLE) ×2 IMPLANT
GOWN STRL REUS W/TWL XL LVL3 (GOWN DISPOSABLE) ×2 IMPLANT
HANDPIECE INTERPULSE COAX TIP (DISPOSABLE) ×1
KIT BASIN OR (CUSTOM PROCEDURE TRAY) ×2 IMPLANT
MANIFOLD NEPTUNE II (INSTRUMENTS) ×2 IMPLANT
PACK TOTAL JOINT (CUSTOM PROCEDURE TRAY) ×2 IMPLANT
PAD ABD 8X10 STRL (GAUZE/BANDAGES/DRESSINGS) IMPLANT
PADDING CAST COTTON 6X4 STRL (CAST SUPPLIES) ×2 IMPLANT
PROTECTOR NERVE ULNAR (MISCELLANEOUS) ×2 IMPLANT
SET HNDPC FAN SPRY TIP SCT (DISPOSABLE) ×1 IMPLANT
SUT ETHILON 2 0 PS N (SUTURE) ×4 IMPLANT
SUT VIC AB 1 CT1 36 (SUTURE) ×4 IMPLANT
SWAB COLLECTION DEVICE MRSA (MISCELLANEOUS) ×2 IMPLANT
SWAB CULTURE ESWAB REG 1ML (MISCELLANEOUS) ×2 IMPLANT
TOWEL OR 17X26 10 PK STRL BLUE (TOWEL DISPOSABLE) ×4 IMPLANT
TRAY PREP A LATEX SAFE STRL (SET/KITS/TRAYS/PACK) ×2 IMPLANT
WND VAC CANISTER 500ML (MISCELLANEOUS) ×2 IMPLANT

## 2018-08-04 NOTE — Anesthesia Postprocedure Evaluation (Signed)
Anesthesia Post Note  Patient: Bruce Deleon  Procedure(s) Performed: REPEAT IRRIGATION AND DEBRIDEMENT OF LEFT ANKLE (Left Foot) APPLICATION OF WOUND VAC (Left Foot)     Patient location during evaluation: PACU Anesthesia Type: General Level of consciousness: awake and alert Pain management: pain level controlled Vital Signs Assessment: post-procedure vital signs reviewed and stable Respiratory status: spontaneous breathing, nonlabored ventilation, respiratory function stable and patient connected to nasal cannula oxygen Cardiovascular status: blood pressure returned to baseline and stable Postop Assessment: no apparent nausea or vomiting Anesthetic complications: no    Last Vitals:  Vitals:   08/04/18 1915 08/04/18 1930  BP: 139/78 133/74  Pulse: 86 81  Resp: 14 17  Temp:  37.3 C  SpO2: 95% 94%    Last Pain:  Vitals:   08/04/18 1930  TempSrc:   PainSc: 6                  Kennieth Rad

## 2018-08-04 NOTE — Progress Notes (Signed)
OT Cancellation Note  Patient Details Name: Bruce Deleon MRN: [id number] DOB: 09-04-1955   Cancelled Treatment:    Reason Eval/Treat Not Completed: Other (comment).  Pt is having sx today. Will check back another day.  Marranda Arakelian 08/04/2018, 11:45 AM  Marica Otter, OTR/L Acute Rehabilitation Services [phone] [facility] pager [phone] office 08/04/2018

## 2018-08-04 NOTE — Progress Notes (Signed)
Patient's am BP 185/91. This RN spoke with the patient who is not normally on BP medication. Patient stated that he is worried about what the surgeon may say this am concerning his foot. BP rechecked 1 hour later manually and found to be 176/76. Report given to day shift RN. Will await attending for further orders.

## 2018-08-04 NOTE — Brief Op Note (Signed)
08/04/2018  6:31 PM  PATIENT:  Bruce Deleon  63 y.o. male  PRE-OPERATIVE DIAGNOSIS:  left ankle infection  POST-OPERATIVE DIAGNOSIS:  left ankle infection  PROCEDURE:  Procedure(s): REPEAT IRRIGATION AND DEBRIDEMENT OF LEFT ANKLE (Left) APPLICATION OF WOUND VAC (Left)  SURGEON:  Surgeon(s) and Role:    Kathryne Hitch, MD - Primary  ANESTHESIA:   general  EBL:  25 mL   PATIENT DISPOSITION:  PACU - hemodynamically stable.   Delay start of Pharmacological VTE agent (>24hrs) due to surgical blood loss or risk of bleeding: no

## 2018-08-04 NOTE — Anesthesia Procedure Notes (Signed)
Procedure Name: LMA Insertion Date/Time: 08/04/2018 5:52 PM Performed by: Minerva Ends, CRNA Pre-anesthesia Checklist: Patient identified, Emergency Drugs available, Suction available and Patient being monitored Patient Re-evaluated:Patient Re-evaluated prior to induction Oxygen Delivery Method: Circle System Utilized Preoxygenation: Pre-oxygenation with 100% oxygen Induction Type: IV induction Ventilation: Mask ventilation without difficulty LMA: LMA inserted LMA Size: 4.0 Number of attempts: 1 Placement Confirmation: positive ETCO2 Tube secured with: Tape Dental Injury: Teeth and Oropharynx as per pre-operative assessment  Comments: Smooth IV induction Fitzgerald-- LMA inserted AM CRNA-- atraumatic-- teeth and mouth as preop-- pt with very loose teeth on the bottom- many missing on top and bottom-- pt did not report loose teeth in preop interview-- dental advisory given by CRNA-- bilat BS Sampson Goon

## 2018-08-04 NOTE — Progress Notes (Signed)
PT Cancellation Note  Patient Details Name: Bruce Deleon MRN: [id number] DOB: May 24, 1956   Cancelled Treatment:    Reason Eval/Treat Not Completed: Medical issues which prohibited therapy Pt to return to OR for further debridement this evening.  Will hold therapy today and check back as schedule permits.   Niklas Chretien,KATHrine E 08/04/2018, 11:29 AM  Zenovia Jarred, PT, DPT Acute Rehabilitation Services Office: [phone] Pager: [phone]

## 2018-08-04 NOTE — Progress Notes (Signed)
Patient ID: Bruce Deleon, male   DOB: 17-May-1956, 63 y.o.   MRN: [id number] Although his WBC has improved, it is still high at 21,000 and his foot/ankle skin and tissue does not look good.  I will need to put him on the OR schedule this evening for a repeat I&D.

## 2018-08-04 NOTE — Progress Notes (Signed)
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  Status post first and second ray amputation of left foot and extensive I&D of left medial ankle and foot on 1/19, and repeat I&D on 1/22.  Started on vancomycin, Zosyn and clindamycin on 1/19.  Clindamycin was stopped earlier.  Vancomycin stopped on 1/22-wound culture did not grow MRSA.  Subjective: No major events overnight.  No complaints this morning.  Aware of the plan by orthopedic surgery today.  Assessment & Plan: Principal Problem:   Left foot and ankle infection Active Problems:   Osteomyelitis (HCC)  Left foot infection/gas gangrene of left medial ankle/necrotic first and second toes:  -First and second ray amputation of left foot 1/19 -I&D of left medial ankle and foot on 1/19 and 1/22 -Blood cultures negative -Wound culture grew MSSA -Vancomycin 1/19-1/22 -Clindamycin 1/19-1/21 -Zosyn 1/19 -Repeat I&D on 1/22. -Appreciate operative surgery help  Leukocytosis: Improving.  Likely due to above -Antibiotic above -Continue trending  Mild hypokalemia -Replace and recheck  Hypertension: BP elevated -Start low-dose amlodipine -PRN Hydralazine  ALS with invasive ventilation with trilogy: Stable Wheelchair-bound -Continue trilogy  Protein calorie malnutrition -Appreciate nutrition input  Scheduled Meds: . [MAR Hold] amLODipine  5 mg Oral Daily  . [MAR Hold] heparin  5,000 Units Subcutaneous Q8H   Continuous Infusions: . sodium chloride 100 mL/hr at 08/04/18 1359  . lactated ringers 10 mL/hr at 08/04/18 1706  . [MAR Hold] piperacillin-tazobactam (ZOSYN)  IV 3.375 g (08/04/18  1400)   PRN Meds:.[MAR Hold] alum & mag hydroxide-simeth, [MAR Hold] hydrALAZINE, [MAR Hold]  HYDROmorphone (DILAUDID) injection, [MAR Hold] ipratropium-albuterol, [MAR Hold] ondansetron (ZOFRAN) IV, [MAR Hold] oxyCODONE, polymyxin / bacitracin (DOUBLE ANTIBIOTIC) irrigation, sodium chloride irrigation  DVT prophylaxis: Lovenox Code Status: Full code Family Communication: No family by bedside Disposition Plan: Remains inpatient clinical improvement.  Final disposition likely SNF.  Consultants:   Orthopedic surgery  Procedures:   First and second ray amputation of left foot on 1/19  I&D of the left medial ankle on 1/19 and 1/22  Antimicrobials:  As above  Objective: Vitals:   08/04/18 0640 08/04/18 1329 08/04/18 1500 08/04/18 1658  BP: (!) 176/76 (!) 194/89 (!) 174/76   Pulse:  94    Resp:  20    Temp:  (!) 97.2 F (36.2 C)    TempSrc:  Oral    SpO2:  94%    Weight:    67.5 kg  Height:    6' (1.829 m)    Intake/Output Summary (Last 24 hours) at 08/04/2018 1819 Last data filed at 08/04/2018 1710 Gross per 24 hour  Intake 1165.79 ml  Output 802 ml  Net 363.79 ml   Filed Weights   08/01/18 1741 08/02/18 0035 08/04/18 1658  Weight: 72.6 kg 67.5 kg 67.5 kg    Examination:  GENERAL: Appears well. No acute distress.  HEENT: MMM.  Vision and Hearing grossly intact.  NECK: Supple.   LUNGS:  No IWOB. Good air movement. CTAB.  HEART:  RRR. Heart sounds normal. ABD: Bowel sounds present. Soft. Non tender.  MSK/EXT: Left lower extremity and feet in Ace wrap SKIN: no apparent skin lesion.  NEURO: Awake, alert and oriented appropriately.  No gross deficit.  PSYCH: Calm. Normal affect.   Data Reviewed: I have independently reviewed following labs and imaging studies  CBC: Recent Labs  Lab 08/01/18 1751 08/02/18 0143 08/03/18 0428 08/04/18 0410  WBC 28.3* 27.9* 36.8* 21.1*  NEUTROABS 23.5* 24.9* 32.3* 17.6*  HGB 14.4 12.4* 13.2 12.8*  HCT 44.9 39.2 42.7 40.8    MCV 93.2 95.8 95.5 95.3  PLT 439* 391 447* 473*   Basic Metabolic Panel: Recent Labs  Lab 08/01/18 1751 08/02/18 0143 08/03/18 0428 08/04/18 0410  NA 135 137 134* 137  K 3.3* 3.7 3.6 3.4*  CL 94* 107 101 105  CO2 26 20* 24 22  GLUCOSE 103* 108* 102* 83  BUN 29* 27* 21 17  CREATININE 0.90 0.77 0.65 0.66  CALCIUM 8.7* 7.8* 7.9* 7.9*  MG  --  2.4 2.4 2.4  PHOS  --   --   --  2.1*   GFR: Estimated Creatinine Clearance: 90.2 mL/min (by C-G formula based on SCr of 0.66 mg/dL). Liver Function Tests: Recent Labs  Lab 08/01/18 1751 08/03/18 0428 08/04/18 0410  AST 38 24  --   ALT 30 23  --   ALKPHOS 89 62  --   BILITOT 1.7* 0.9  --   PROT 7.7 6.2*  --   ALBUMIN 3.0* 2.1* 2.1*   No results for input(s): LIPASE, AMYLASE in the last 168 hours. No results for input(s): AMMONIA in the last 168 hours. Coagulation Profile: Recent Labs  Lab 08/01/18 1751  INR 1.19   Cardiac Enzymes: No results for input(s): CKTOTAL, CKMB, CKMBINDEX, TROPONINI in the last 168 hours. BNP (last 3 results) No results for input(s): PROBNP in the last 8760 hours. HbA1C: No results for input(s): HGBA1C in the last 72 hours. CBG: No results for input(s): GLUCAP in the last 168 hours. Lipid Profile: No results for input(s): CHOL, HDL, LDLCALC, TRIG, CHOLHDL, LDLDIRECT in the last 72 hours. Thyroid Function Tests: No results for input(s): TSH, T4TOTAL, FREET4, T3FREE, THYROIDAB in the last 72 hours. Anemia Panel: No results for input(s): VITAMINB12, FOLATE, FERRITIN, TIBC, IRON, RETICCTPCT in the last 72 hours. Urine analysis: No results found for: COLORURINE, APPEARANCEUR, LABSPEC, PHURINE, GLUCOSEU, HGBUR, BILIRUBINUR, KETONESUR, PROTEINUR, UROBILINOGEN, NITRITE, LEUKOCYTESUR Sepsis Labs: Invalid input(s): PROCALCITONIN, LACTICIDVEN  Recent Results (from the past 240 hour(s))  Culture, blood (Routine x 2)     Status: None (Preliminary result)   Collection Time: 08/01/18  5:51 PM  Result  Value Ref Range Status   Specimen Description   Final    BLOOD LEFT ANTECUBITAL Performed at [facility], [address] [place], [place] [address]    Special Requests   Final    BOTTLES DRAWN AEROBIC AND ANAEROBIC Blood Culture adequate volume Performed at [facility], [address] [place], [place] [address]    Culture   Final    NO GROWTH 3 DAYS Performed at [facility], [address] [place], [place] [address]    Report Status PENDING  Incomplete  Culture, blood (Routine x 2)     Status: None (Preliminary result)   Collection Time: 08/01/18  5:56 PM  Result Value Ref Range Status   Specimen Description   Final    BLOOD BLOOD RIGHT FOREARM Performed at [facility], [address] [place], [place] [address]    Special Requests   Final    BOTTLES DRAWN AEROBIC AND ANAEROBIC Blood Culture adequate volume Performed at [facility], [address] [place], [place] [address]    Culture   Final    NO GROWTH 3 DAYS Performed at [facility], [address] [place], [place] [address]    Report Status PENDING  Incomplete  Aerobic/Anaerobic Culture (surgical/deep wound)     Status: None (Preliminary result)   Collection Time: 08/01/18 10:22 PM  Result Value Ref Range Status   Specimen Description WOUND LEFT FOOT  Final   Special Requests   Final    NONE Performed at [facility], [address], [place], [place] [address]    Gram Stain NO ORGANISMS SEEN MODERATE GRAM POSITIVE COCCI   Final   Culture   Final    MODERATE STAPHYLOCOCCUS AUREUS NO ANAEROBES ISOLATED; CULTURE IN PROGRESS FOR 5 DAYS    Report Status PENDING  Incomplete   Organism ID, Bacteria STAPHYLOCOCCUS AUREUS  Final      Susceptibility   Staphylococcus aureus - MIC*    CIPROFLOXACIN <=0.5 SENSITIVE Sensitive     ERYTHROMYCIN <=0.25 SENSITIVE Sensitive     GENTAMICIN <=0.5 SENSITIVE Sensitive      OXACILLIN 0.5 SENSITIVE Sensitive     TETRACYCLINE <=1 SENSITIVE Sensitive     VANCOMYCIN <=0.5 SENSITIVE Sensitive     TRIMETH/SULFA <=10 SENSITIVE Sensitive     CLINDAMYCIN <=0.25 SENSITIVE Sensitive     RIFAMPIN <=0.5 SENSITIVE Sensitive     Inducible Clindamycin NEGATIVE Sensitive     * MODERATE STAPHYLOCOCCUS AUREUS      Radiology Studies: No results found.    [facility] Pager [phone]  If 7PM-7AM, please contact night-coverage [email or web address] Password [email or web address] 08/04/2018, 6:19 PM

## 2018-08-04 NOTE — Op Note (Signed)
NAMEJANEL, PONTIFF MEDICAL RECORD ZL:[id number] ACCOUNT [id number] DATE OF BIRTH:02-05-1956 FACILITY: [facility] LOCATION: [facility] PHYSICIAN:Baleria Wyman Aretha Parrot, MD  OPERATIVE REPORT  DATE OF PROCEDURE:  08/04/2018  PREOPERATIVE DIAGNOSES:  Left foot and ankle infection, status post irrigation and debridement x1, status post first and second ray amputations.  POSTOPERATIVE DIAGNOSIS:  Left foot and ankle infection, status post irrigation and debridement x1, status post first and second ray amputations.  FINDINGS:  Worsening necrotic tissue, left ankle and foot.  PROCEDURE:  Repeat irrigation and debridement of left foot and ankle with sharp excisional debridement of necrotic skin and fascia.  SURGEON:  Vanita Panda. Magnus Ivan, MD  ANESTHESIA:  General.  ESTIMATED BLOOD LOSS:  25 mL.  COMPLICATIONS:  None.  INDICATIONS:  The patient is a 63 year old gentleman who we took to the operating room urgently Sunday evening with an overwhelming infection involving his left foot and ankle.  This started off as a wound near his MTP joint.  He had had that wound for  about a month.  He presented to the emergency room after 24 hours of worsening ankle pain and swelling.  In the emergency room, there was air in the soft tissues and crepitation on exam consistent with necrotizing fasciitis.  We took him to the operating  room and had to debride necrotic first and second rays.  We performed first and second ray amputations.  We found infection tracking along the plantar aspect of his foot as well as laterally.  Over the last few days since surgery, his white count has  stayed persistently high.  He has developed worsening necrosis of the skin and soft tissue.  He is presenting for repeat irrigation and debridement of his left foot and ankle.  DESCRIPTION OF PROCEDURE:  After informed consent was obtained and appropriate left foot and ankle were marked, he was brought to the operating room and placed  supine on the operating table.  General anesthesia was then obtained.  His left foot and ankle  were prepped and draped with Betadine and sterile drapes.  Time-out was called, and he was identified as correct patient, correct left foot and ankle.  We then found a wide area of necrotic skin on the medial aspect of his ankle.  Using a #10 blade, we  performed a sharp excisional debridement of a wide area of necrotic skin.  We found the fascia, and we debrided this as well.  Necrotic tissue was running into the plantar aspect of his foot.  We removed some plantar fascia as well as some medial tendon  structures of the ankle, which was likely the peroneal tendons.  These were all necrotic.  Once we removed this, we irrigated thoroughly a wide area with normal saline solution on the medial aspect of the foot and ankle and the plantar aspect of the  foot.  Once we were able to do this, we dried it very well.  We had to place a VAC sponge over the medial aspect of his ankle over the large soft tissue deficit, and we were able to close some of the foot wound at the forefoot.  Once we had done this,  the VAC was placed and a seal was placed to this setting, the suction at -125 mm of pressure.  A well-padded sterile dressing was applied.  He was awakened, extubated, and taken to recovery room in stable condition.  All final counts were correct.  There  were no complications noted.  Postoperatively, I will have to  talk to him and his family at length.  I believe the next step will be a below-knee amputation due to limb-threatening nature of this infection.  LN/NUANCE  D:08/04/2018 T:08/04/2018 JOB:[id number]

## 2018-08-04 NOTE — Anesthesia Procedure Notes (Signed)
Date/Time: 08/04/2018 6:26 PM Performed by: Minerva Ends, CRNA Oxygen Delivery Method: Simple face mask Placement Confirmation: positive ETCO2 and breath sounds checked- equal and bilateral Dental Injury: Teeth and Oropharynx as per pre-operative assessment

## 2018-08-04 NOTE — Anesthesia Preprocedure Evaluation (Signed)
Anesthesia Evaluation  Patient identified by MRN, date of birth, ID band Patient awake    Reviewed: Allergy & Precautions, NPO status , Patient's Chart, lab work & pertinent test results  History of Anesthesia Complications Negative for: history of anesthetic complications  Airway Mallampati: II  TM Distance: >3 FB Neck ROM: Full    Dental  (+) Dental Advisory Given, Edentulous Upper, Missing   Pulmonary former smoker,    breath sounds clear to auscultation       Cardiovascular hypertension,  Rhythm:Regular Rate:Normal     Neuro/Psych  ALS (right-sided paraplegia)   Neuromuscular disease negative psych ROS   GI/Hepatic negative GI ROS, Neg liver ROS,   Endo/Other  negative endocrine ROS  Renal/GU negative Renal ROS     Musculoskeletal negative musculoskeletal ROS (+)   Abdominal   Peds  Hematology negative hematology ROS (+)   Anesthesia Other Findings   Reproductive/Obstetrics                             Lab Results  Component Value Date   WBC 21.1 (H) 08/04/2018   HGB 12.8 (L) 08/04/2018   HCT 40.8 08/04/2018   MCV 95.3 08/04/2018   PLT 473 (H) 08/04/2018   Lab Results  Component Value Date   CREATININE 0.66 08/04/2018   BUN 17 08/04/2018   NA 137 08/04/2018   K 3.4 (L) 08/04/2018   CL 105 08/04/2018   CO2 22 08/04/2018    Anesthesia Physical  Anesthesia Plan  ASA: III  Anesthesia Plan: General   Post-op Pain Management:    Induction: Intravenous  PONV Risk Score and Plan: 2 and Treatment may vary due to age or medical condition, Ondansetron and Dexamethasone  Airway Management Planned: LMA  Additional Equipment: None  Intra-op Plan:   Post-operative Plan: Extubation in OR  Informed Consent: I have reviewed the patients History and Physical, chart, labs and discussed the procedure including the risks, benefits and alternatives for the proposed anesthesia  with the patient or authorized representative who has indicated his/her understanding and acceptance.     Dental advisory given  Plan Discussed with: CRNA and Anesthesiologist  Anesthesia Plan Comments:         Anesthesia Quick Evaluation

## 2018-08-04 NOTE — Transfer of Care (Signed)
Immediate Anesthesia Transfer of Care Note  Patient: Bruce Deleon  Procedure(s) Performed: REPEAT IRRIGATION AND DEBRIDEMENT OF LEFT ANKLE (Left Foot) APPLICATION OF WOUND VAC (Left Foot)  Patient Location: PACU  Anesthesia Type:General  Level of Consciousness: awake and alert   Airway & Oxygen Therapy: Patient Spontanous Breathing and Patient connected to face mask oxygen  Post-op Assessment: Report given to RN and Post -op Vital signs reviewed and stable  Post vital signs: Reviewed and stable  Last Vitals:  Vitals Value Taken Time  BP    Temp    Pulse    Resp    SpO2      Last Pain:  Vitals:   08/04/18 1329  TempSrc: Oral  PainSc:       Patients Stated Pain Goal: 2 (08/03/18 1845)  Complications: No apparent anesthesia complications

## 2018-08-05 ENCOUNTER — Encounter (HOSPITAL_COMMUNITY): Payer: Self-pay | Admitting: Orthopaedic Surgery

## 2018-08-05 LAB — CBC WITH DIFFERENTIAL/PLATELET
Abs Immature Granulocytes: 0.52 10*3/uL — ABNORMAL HIGH (ref 0.00–0.07)
Basophils Absolute: 0 10*3/uL (ref 0.0–0.1)
Basophils Relative: 0 %
Eosinophils Absolute: 0 10*3/uL (ref 0.0–0.5)
Eosinophils Relative: 0 %
HEMATOCRIT: 41.8 % (ref 39.0–52.0)
Hemoglobin: 13 g/dL (ref 13.0–17.0)
IMMATURE GRANULOCYTES: 3 %
Lymphocytes Relative: 3 %
Lymphs Abs: 0.7 10*3/uL (ref 0.7–4.0)
MCH: 29.5 pg (ref 26.0–34.0)
MCHC: 31.1 g/dL (ref 30.0–36.0)
MCV: 95 fL (ref 80.0–100.0)
Monocytes Absolute: 0.7 10*3/uL (ref 0.1–1.0)
Monocytes Relative: 3 %
NEUTROS ABS: 19.1 10*3/uL — AB (ref 1.7–7.7)
NEUTROS PCT: 91 %
Platelets: 493 10*3/uL — ABNORMAL HIGH (ref 150–400)
RBC: 4.4 MIL/uL (ref 4.22–5.81)
RDW: 13.7 % (ref 11.5–15.5)
WBC: 21 10*3/uL — ABNORMAL HIGH (ref 4.0–10.5)
nRBC: 0 % (ref 0.0–0.2)

## 2018-08-05 LAB — BASIC METABOLIC PANEL
Anion gap: 11 (ref 5–15)
BUN: 13 mg/dL (ref 8–23)
CO2: 24 mmol/L (ref 22–32)
Calcium: 7.9 mg/dL — ABNORMAL LOW (ref 8.9–10.3)
Chloride: 102 mmol/L (ref 98–111)
Creatinine, Ser: 0.68 mg/dL (ref 0.61–1.24)
GFR calc Af Amer: >60 mL/min (ref >60)
GFR calc non Af Amer: >60 mL/min (ref >60)
Glucose, Bld: 100 mg/dL — ABNORMAL HIGH (ref 70–99)
Potassium: 3.9 mmol/L (ref 3.5–5.1)
Sodium: 137 mmol/L (ref 135–145)

## 2018-08-05 LAB — MAGNESIUM: Magnesium: 2.1 mg/dL (ref 1.7–2.4)

## 2018-08-05 MED ORDER — PHENYLEPHRINE 40 MCG/ML (10ML) SYRINGE FOR IV PUSH (FOR BLOOD PRESSURE SUPPORT)
PREFILLED_SYRINGE | INTRAVENOUS | Status: AC
  Filled 2018-08-05: qty 10

## 2018-08-05 MED ORDER — SUGAMMADEX SODIUM 200 MG/2ML IV SOLN
INTRAVENOUS | Status: AC
  Filled 2018-08-05: qty 2

## 2018-08-05 NOTE — Progress Notes (Signed)
PT Cancellation Note  Patient Details Name: Bruce Deleon MRN: [id number] DOB: [birth year]   Cancelled Treatment:    Reason Eval/Treat Not Completed: Other (comment);Fatigue/lethargy limiting ability to participate(c/r d/t not feeling well and being stressed about pending surgery).  Will f/u per Dr. Eliberto Ivory orders after BKA scheduled for Friday   [facility] 08/05/2018, 12:12 PM

## 2018-08-05 NOTE — Addendum Note (Signed)
Addendum  created 08/05/18 1014 by Elyn Peers, CRNA   Charge Capture section accepted

## 2018-08-05 NOTE — Progress Notes (Signed)
PROGRESS NOTE  Bruce RicksMark Deleon LKG:[id number] DOB: 1956-03-26 DOA: 08/01/2018 PCP: Seward MethHess, Terry D, MD   LOS: 4 days    Brief Narrative / Interim history: 63 year old male with history of ALS, right-sided paraplegia at baseline, noninvasive ventilation with trilogy at home, hypertension admitted with left arm cold and foot infection with necrotizing fasciitis and necrotic first and second rays.  Status post first and second ray amputation of left foot and extensive I&D of left medial ankle and foot on 1/19, and repeat I&D on 1/22.  Started on vancomycin, Zosyn and clindamycin on 1/19.  Clindamycin was stopped earlier.  Vancomycin stopped on 1/22-wound culture did not grow MRSA.  Subjective: No major events overnight or this morning.  No complaint this morning.  Had repeat I&D yesterday.  Pain well controlled.  Assessment & Plan: Principal Problem:   Left foot and ankle infection Active Problems:   Osteomyelitis (HCC)  Left foot infection/gas gangrene of left medial ankle/necrotic first and second toes:  -Orthopedic surgery following-plan to take back to the OR for BKA on 1/24 due to extensive necrotic tissues. -First and second ray amputation of left foot 1/19 -I&D of left medial ankle and foot on 1/19 and 1/22 -Blood cultures negative -Wound culture grew MSSA -Vancomycin 1/19-1/22 -Clindamycin 1/19-1/21 -Zosyn 1/19 -Repeat I&D on 1/22.  Leukocytosis: Remains elevated to 21K. -Antibiotic above -Continue trending  Mild hypokalemia: Resolved.  Magnesium within normal range. -recheck  Hypertension: SBP in 150s. -Started low-dose amlodipine -PRN Hydralazine  ALS with invasive ventilation with trilogy: Stable Wheelchair-bound -Continue trilogy  Protein calorie malnutrition -Appreciate nutrition input  Scheduled Meds: . amLODipine  5 mg Oral Daily  . heparin  5,000 Units Subcutaneous Q8H   Continuous Infusions: . sodium chloride 100 mL/hr at 08/05/18 1207  . lactated  ringers 10 mL/hr at 08/04/18 1706  . piperacillin-tazobactam (ZOSYN)  IV 3.375 g (08/05/18 0547)   PRN Meds:.alum & mag hydroxide-simeth, hydrALAZINE, HYDROmorphone (DILAUDID) injection, ipratropium-albuterol, ondansetron (ZOFRAN) IV, oxyCODONE  DVT prophylaxis: Lovenox Code Status: Full code Family Communication: Wife at bedside. Disposition Plan: Remains inpatient.  Plan to go back to the OR on 1/24 for BKA.  Consultants:   Orthopedic surgery  Procedures:   First and second ray amputation of left foot on 1/19  I&D of the left medial ankle on 1/19 and 1/22  Antimicrobials:  As above  Objective: Vitals:   08/04/18 1945 08/04/18 1949 08/04/18 2007 08/05/18 0528  BP: (!) 154/74 (!) 154/80  (!) 156/73  Pulse: 87 82  78  Resp: 20 18  12   Temp:   97.7 F (36.5 C) 97.8 F (36.6 C)  TempSrc:   Oral Oral  SpO2: 94% 94%  94%  Weight:      Height:        Intake/Output Summary (Last 24 hours) at 08/05/2018 1338 Last data filed at 08/05/2018 0600 Gross per 24 hour  Intake 3187.86 ml  Output 1777 ml  Net 1410.86 ml   Filed Weights   08/01/18 1741 08/02/18 0035 08/04/18 1658  Weight: 72.6 kg 67.5 kg 67.5 kg    Examination: GENERAL: Appears well. No acute distress.  HEENT: MMM.  Vision and Hearing grossly intact.  NECK: Supple.  No JVD.  LUNGS:  No IWOB. Good air movement. CTAB.  HEART:  RRR. Heart sounds normal.  ABD: Bowel sounds present. Soft. Non tender.  EXT: Wound VAC over left foot.  No notable erythema or sign of infection proximally. SKIN: no apparent skin lesion.  NEURO: Awake, alert  and oriented appropriately.  No gross deficit.  PSYCH: Calm. Normal affect.   Data Reviewed: I have independently reviewed following labs and imaging studies  CBC: Recent Labs  Lab 08/01/18 1751 08/02/18 0143 08/03/18 0428 08/04/18 0410 08/05/18 0511  WBC 28.3* 27.9* 36.8* 21.1* 21.0*  NEUTROABS 23.5* 24.9* 32.3* 17.6* 19.1*  HGB 14.4 12.4* 13.2 12.8* 13.0  HCT 44.9  39.2 42.7 40.8 41.8  MCV 93.2 95.8 95.5 95.3 95.0  PLT 439* 391 447* 473* 493*   Basic Metabolic Panel: Recent Labs  Lab 08/01/18 1751 08/02/18 0143 08/03/18 0428 08/04/18 0410 08/05/18 0511  NA 135 137 134* 137 137  K 3.3* 3.7 3.6 3.4* 3.9  CL 94* 107 101 105 102  CO2 26 20* 24 22 24   GLUCOSE 103* 108* 102* 83 100*  BUN 29* 27* 21 17 13   CREATININE 0.90 0.77 0.65 0.66 0.68  CALCIUM 8.7* 7.8* 7.9* 7.9* 7.9*  MG  --  2.4 2.4 2.4 2.1  PHOS  --   --   --  2.1*  --    GFR: Estimated Creatinine Clearance: 90.2 mL/min (by C-G formula based on SCr of 0.68 mg/dL). Liver Function Tests: Recent Labs  Lab 08/01/18 1751 08/03/18 0428 08/04/18 0410  AST 38 24  --   ALT 30 23  --   ALKPHOS 89 62  --   BILITOT 1.7* 0.9  --   PROT 7.7 6.2*  --   ALBUMIN 3.0* 2.1* 2.1*   No results for input(s): LIPASE, AMYLASE in the last 168 hours. No results for input(s): AMMONIA in the last 168 hours. Coagulation Profile: Recent Labs  Lab 08/01/18 1751  INR 1.19   Cardiac Enzymes: No results for input(s): CKTOTAL, CKMB, CKMBINDEX, TROPONINI in the last 168 hours. BNP (last 3 results) No results for input(s): PROBNP in the last 8760 hours. HbA1C: No results for input(s): HGBA1C in the last 72 hours. CBG: No results for input(s): GLUCAP in the last 168 hours. Lipid Profile: No results for input(s): CHOL, HDL, LDLCALC, TRIG, CHOLHDL, LDLDIRECT in the last 72 hours. Thyroid Function Tests: No results for input(s): TSH, T4TOTAL, FREET4, T3FREE, THYROIDAB in the last 72 hours. Anemia Panel: No results for input(s): VITAMINB12, FOLATE, FERRITIN, TIBC, IRON, RETICCTPCT in the last 72 hours. Urine analysis: No results found for: COLORURINE, APPEARANCEUR, LABSPEC, PHURINE, GLUCOSEU, HGBUR, BILIRUBINUR, KETONESUR, PROTEINUR, UROBILINOGEN, NITRITE, LEUKOCYTESUR Sepsis Labs: Invalid input(s): PROCALCITONIN, LACTICIDVEN  Recent Results (from the past 240 hour(s))  Culture, blood (Routine x 2)      Status: None (Preliminary result)   Collection Time: 08/01/18  5:51 PM  Result Value Ref Range Status   Specimen Description   Final    BLOOD LEFT ANTECUBITAL Performed at [facility], [address] [place], [place] [address]    Special Requests   Final    BOTTLES DRAWN AEROBIC AND ANAEROBIC Blood Culture adequate volume Performed at [facility], [address] [place], [place] [address]    Culture   Final    NO GROWTH 4 DAYS Performed at [facility], [address] [place], [place] [address]    Report Status PENDING  Incomplete  Culture, blood (Routine x 2)     Status: None (Preliminary result)   Collection Time: 08/01/18  5:56 PM  Result Value Ref Range Status   Specimen Description   Final    BLOOD BLOOD RIGHT FOREARM Performed at [facility], [address] [place], [place]  [address]    Special Requests   Final    BOTTLES DRAWN AEROBIC AND ANAEROBIC Blood Culture adequate volume Performed at [facility], [address] [place], [place] [address]    Culture   Final    NO GROWTH 4 DAYS Performed at [facility], [address] [place], [place] [address]    Report Status PENDING  Incomplete  Aerobic/Anaerobic Culture (surgical/deep wound)     Status: None (Preliminary result)   Collection Time: 08/01/18 10:22 PM  Result Value Ref Range Status   Specimen Description WOUND LEFT FOOT  Final   Special Requests   Final    NONE Performed at [facility], [address] [place], [place] [address]    Gram Stain NO ORGANISMS SEEN MODERATE GRAM POSITIVE COCCI   Final   Culture   Final    MODERATE STAPHYLOCOCCUS AUREUS NO ANAEROBES ISOLATED; CULTURE IN PROGRESS FOR 5 DAYS    Report Status PENDING  Incomplete   Organism ID, Bacteria STAPHYLOCOCCUS AUREUS  Final      Susceptibility   Staphylococcus aureus - MIC*    CIPROFLOXACIN <=0.5 SENSITIVE Sensitive      ERYTHROMYCIN <=0.25 SENSITIVE Sensitive     GENTAMICIN <=0.5 SENSITIVE Sensitive     OXACILLIN 0.5 SENSITIVE Sensitive     TETRACYCLINE <=1 SENSITIVE Sensitive     VANCOMYCIN <=0.5 SENSITIVE Sensitive     TRIMETH/SULFA <=10 SENSITIVE Sensitive     CLINDAMYCIN <=0.25 SENSITIVE Sensitive     RIFAMPIN <=0.5 SENSITIVE Sensitive     Inducible Clindamycin NEGATIVE Sensitive     * MODERATE STAPHYLOCOCCUS AUREUS      Radiology Studies: No results found.  Terilyn Sano T. [facility] Pager [phone]  If 7PM-7AM, please contact night-coverage [email or web address] Password [facility] 08/05/2018, 1:38 PM

## 2018-08-05 NOTE — Progress Notes (Signed)
Patient ID: Bruce Deleon, male   DOB: 05/03/1956, 63 y.o.   MRN: [id number] Tolerated surgery last evening.  However, the tissue in his left foot looks bad and there was still extensive necrosis.  I had to remove a significant amount of skin on the medial ankle and foot.  The lateral aspect of his foot and ankle are not looking good at all.  His WBC is still high.  I have spoken to the patient and his wife at the bedside extensively and in great detail explaining his situation.  At this point due to continued necrosis, I am recommending a left below knee amputation.  I will put him on the OR schedule for late tomorrow afternoon to perform this surgery.  All questions and concerns were answered and addressed.

## 2018-08-05 NOTE — Care Management Important Message (Signed)
Important Message  Patient Details  Name: Aizik Brolin MRN: [id number] Date of Birth: 1955-08-11   Medicare Important Message Given:  Yes    [name] 08/05/2018, 11:32 AMImportant Message  Patient Details  Name: Aftab Nesseth MRN: [id number] Date of Birth: [birth year]   Medicare Important Message Given:  Yes    [name] 08/05/2018, 11:31 AM

## 2018-08-06 ENCOUNTER — Inpatient Hospital Stay (HOSPITAL_COMMUNITY): Payer: Medicare Other | Admitting: Certified Registered"

## 2018-08-06 ENCOUNTER — Encounter (HOSPITAL_COMMUNITY): Payer: Self-pay | Admitting: Certified Registered Nurse Anesthetist

## 2018-08-06 ENCOUNTER — Encounter (HOSPITAL_COMMUNITY)
Admission: EM | Disposition: A | Discharge status: Skilled Nursing Facility | Payer: Self-pay | Source: Home / Self Care | Attending: Student

## 2018-08-06 HISTORY — PX: AMPUTATION: SHX166

## 2018-08-06 LAB — CBC WITH DIFFERENTIAL/PLATELET
Abs Immature Granulocytes: 0.57 10*3/uL — ABNORMAL HIGH (ref 0.00–0.07)
Basophils Absolute: 0.1 10*3/uL (ref 0.0–0.1)
Basophils Relative: 0 %
Eosinophils Absolute: 0.1 10*3/uL (ref 0.0–0.5)
Eosinophils Relative: 0 %
HEMATOCRIT: 40.2 % (ref 39.0–52.0)
Hemoglobin: 12.5 g/dL — ABNORMAL LOW (ref 13.0–17.0)
Immature Granulocytes: 3 %
Lymphocytes Relative: 5 %
Lymphs Abs: 1.2 10*3/uL (ref 0.7–4.0)
MCH: 29.9 pg (ref 26.0–34.0)
MCHC: 31.1 g/dL (ref 30.0–36.0)
MCV: 96.2 fL (ref 80.0–100.0)
Monocytes Absolute: 1.7 10*3/uL — ABNORMAL HIGH (ref 0.1–1.0)
Monocytes Relative: 7 %
NEUTROS ABS: 19.1 10*3/uL — AB (ref 1.7–7.7)
Neutrophils Relative %: 85 %
Platelets: 409 10*3/uL — ABNORMAL HIGH (ref 150–400)
RBC: 4.18 MIL/uL — ABNORMAL LOW (ref 4.22–5.81)
RDW: 13.7 % (ref 11.5–15.5)
WBC: 22.7 10*3/uL — ABNORMAL HIGH (ref 4.0–10.5)
nRBC: 0 % (ref 0.0–0.2)

## 2018-08-06 LAB — CULTURE, BLOOD (ROUTINE X 2)
CULTURE: NO GROWTH
Culture: NO GROWTH
SPECIAL REQUESTS: ADEQUATE
Special Requests: ADEQUATE

## 2018-08-06 LAB — MAGNESIUM: Magnesium: 2.1 mg/dL (ref 1.7–2.4)

## 2018-08-06 LAB — TYPE AND SCREEN
ABO/RH(D): A POS
Antibody Screen: NEGATIVE

## 2018-08-06 SURGERY — AMPUTATION BELOW KNEE
Anesthesia: General | Site: Leg Lower | Laterality: Left

## 2018-08-06 MED ORDER — FENTANYL CITRATE (PF) 100 MCG/2ML IJ SOLN
INTRAMUSCULAR | Status: DC | PRN
  Administered 2018-08-06: 100 ug via INTRAVENOUS
  Administered 2018-08-06 (×3): 50 ug via INTRAVENOUS

## 2018-08-06 MED ORDER — LIDOCAINE 2% (20 MG/ML) 5 ML SYRINGE
INTRAMUSCULAR | Status: AC
  Filled 2018-08-06: qty 5

## 2018-08-06 MED ORDER — 0.9 % SODIUM CHLORIDE (POUR BTL) OPTIME
TOPICAL | Status: DC | PRN
  Administered 2018-08-06: 1000 mL

## 2018-08-06 MED ORDER — IPRATROPIUM-ALBUTEROL 0.5-2.5 (3) MG/3ML IN SOLN
3.0000 mL | Freq: Four times a day (QID) | RESPIRATORY_TRACT | Status: DC
  Administered 2018-08-06 (×2): 3 mL via RESPIRATORY_TRACT
  Filled 2018-08-06 (×2): qty 3

## 2018-08-06 MED ORDER — PROMETHAZINE HCL 25 MG/ML IJ SOLN: 6.2500 mg | INTRAMUSCULAR | Status: DC | PRN

## 2018-08-06 MED ORDER — HYDROMORPHONE HCL 1 MG/ML IJ SOLN
INTRAMUSCULAR | Status: AC
  Filled 2018-08-06: qty 1

## 2018-08-06 MED ORDER — FENTANYL CITRATE (PF) 250 MCG/5ML IJ SOLN
INTRAMUSCULAR | Status: AC
  Filled 2018-08-06: qty 5

## 2018-08-06 MED ORDER — LIDOCAINE 2% (20 MG/ML) 5 ML SYRINGE
INTRAMUSCULAR | Status: DC | PRN
  Administered 2018-08-06: 60 mg via INTRAVENOUS

## 2018-08-06 MED ORDER — MIDAZOLAM HCL 2 MG/2ML IJ SOLN
INTRAMUSCULAR | Status: AC
  Filled 2018-08-06: qty 2

## 2018-08-06 MED ORDER — PROPOFOL 10 MG/ML IV BOLUS
INTRAVENOUS | Status: AC
  Filled 2018-08-06: qty 20

## 2018-08-06 MED ORDER — IPRATROPIUM-ALBUTEROL 0.5-2.5 (3) MG/3ML IN SOLN
3.0000 mL | Freq: Three times a day (TID) | RESPIRATORY_TRACT | Status: DC
  Administered 2018-08-07: 3 mL via RESPIRATORY_TRACT
  Filled 2018-08-06: qty 3

## 2018-08-06 MED ORDER — HYDROMORPHONE HCL 1 MG/ML IJ SOLN
INTRAMUSCULAR | Status: AC
  Administered 2018-08-06: 0.5 mg via INTRAVENOUS
  Filled 2018-08-06: qty 2

## 2018-08-06 MED ORDER — BUDESONIDE 0.25 MG/2ML IN SUSP
0.2500 mg | Freq: Two times a day (BID) | RESPIRATORY_TRACT | Status: DC
  Administered 2018-08-06 – 2018-08-10 (×9): 0.25 mg via RESPIRATORY_TRACT
  Filled 2018-08-06 (×9): qty 2

## 2018-08-06 MED ORDER — MIDAZOLAM HCL 5 MG/5ML IJ SOLN
INTRAMUSCULAR | Status: DC | PRN
  Administered 2018-08-06: 2 mg via INTRAVENOUS

## 2018-08-06 MED ORDER — ACETAMINOPHEN 10 MG/ML IV SOLN
INTRAVENOUS | Status: AC
  Filled 2018-08-06: qty 100

## 2018-08-06 MED ORDER — ONDANSETRON HCL 4 MG/2ML IJ SOLN
INTRAMUSCULAR | Status: AC
  Filled 2018-08-06: qty 2

## 2018-08-06 MED ORDER — DEXAMETHASONE SODIUM PHOSPHATE 10 MG/ML IJ SOLN
INTRAMUSCULAR | Status: AC
  Filled 2018-08-06: qty 1

## 2018-08-06 MED ORDER — PHENYLEPHRINE 40 MCG/ML (10ML) SYRINGE FOR IV PUSH (FOR BLOOD PRESSURE SUPPORT)
PREFILLED_SYRINGE | INTRAVENOUS | Status: DC | PRN
  Administered 2018-08-06: 80 ug via INTRAVENOUS

## 2018-08-06 MED ORDER — PROPOFOL 10 MG/ML IV BOLUS
INTRAVENOUS | Status: DC | PRN
  Administered 2018-08-06: 120 mg via INTRAVENOUS

## 2018-08-06 MED ORDER — HYDROMORPHONE HCL 1 MG/ML IJ SOLN
0.2500 mg | INTRAMUSCULAR | Status: DC | PRN
  Administered 2018-08-06: 0.25 mg via INTRAVENOUS
  Administered 2018-08-06 (×2): 0.5 mg via INTRAVENOUS
  Administered 2018-08-06: 0.25 mg via INTRAVENOUS

## 2018-08-06 SURGICAL SUPPLY — 58 items
BAG ZIPLOCK 12X15 (MISCELLANEOUS) ×3 IMPLANT
BANDAGE ESMARK 6X9 LF (GAUZE/BANDAGES/DRESSINGS) ×1 IMPLANT
BLADE 10 SAFETY STRL DISP (BLADE) ×3 IMPLANT
BLADE SAGITTAL 25.0X1.37X90 (BLADE) ×2 IMPLANT
BLADE SAGITTAL 25.0X1.37X90MM (BLADE) ×1
BNDG COHESIVE 4X5 TAN STRL (GAUZE/BANDAGES/DRESSINGS) ×3 IMPLANT
BNDG COHESIVE 6X5 TAN STRL LF (GAUZE/BANDAGES/DRESSINGS) ×3 IMPLANT
BNDG ESMARK 6X9 LF (GAUZE/BANDAGES/DRESSINGS) ×3
BNDG GAUZE ELAST 4 BULKY (GAUZE/BANDAGES/DRESSINGS) ×3 IMPLANT
COVER MAYO STAND STRL (DRAPES) ×3 IMPLANT
COVER SURGICAL LIGHT HANDLE (MISCELLANEOUS) ×3 IMPLANT
COVER WAND RF STERILE (DRAPES) ×3 IMPLANT
CUFF TOURN SGL QUICK 34 (TOURNIQUET CUFF) ×2
CUFF TRNQT CYL 34X4X40X1 (TOURNIQUET CUFF) ×1 IMPLANT
DRAIN PENROSE 18X1/2 LTX STRL (DRAIN) IMPLANT
DRAPE ORTHO SPLIT 77X108 STRL (DRAPES) ×2
DRAPE SHEET LG 3/4 BI-LAMINATE (DRAPES) IMPLANT
DRAPE SURG ORHT 6 SPLT 77X108 (DRAPES) ×1 IMPLANT
DRAPE U-SHAPE 47X51 STRL (DRAPES) ×3 IMPLANT
DURAPREP 26ML APPLICATOR (WOUND CARE) ×3 IMPLANT
ELECT REM PT RETURN 15FT ADLT (MISCELLANEOUS) ×3 IMPLANT
EVACUATOR 1/8 PVC DRAIN (DRAIN) IMPLANT
GAUZE SPONGE 4X4 12PLY STRL (GAUZE/BANDAGES/DRESSINGS) ×3 IMPLANT
GAUZE XEROFORM 5X9 LF (GAUZE/BANDAGES/DRESSINGS) ×3 IMPLANT
GLOVE BIO SURGEON STRL SZ7.5 (GLOVE) ×3 IMPLANT
GLOVE BIOGEL PI IND STRL 7.5 (GLOVE) ×2 IMPLANT
GLOVE BIOGEL PI IND STRL 8 (GLOVE) ×2 IMPLANT
GLOVE BIOGEL PI INDICATOR 7.5 (GLOVE) ×4
GLOVE BIOGEL PI INDICATOR 8 (GLOVE) ×4
GLOVE ECLIPSE 8.0 STRL XLNG CF (GLOVE) ×3 IMPLANT
GLOVE ORTHO TXT STRL SZ7.5 (GLOVE) IMPLANT
GOWN STRL REUS W/TWL LRG LVL3 (GOWN DISPOSABLE) ×3 IMPLANT
GOWN STRL REUS W/TWL XL LVL3 (GOWN DISPOSABLE) ×9 IMPLANT
KIT BASIN OR (CUSTOM PROCEDURE TRAY) ×3 IMPLANT
NS IRRIG 1000ML POUR BTL (IV SOLUTION) ×3 IMPLANT
PACK ORTHO EXTREMITY (CUSTOM PROCEDURE TRAY) ×3 IMPLANT
PAD ABD 8X10 STRL (GAUZE/BANDAGES/DRESSINGS) ×6 IMPLANT
PAD CAST 4YDX4 CTTN HI CHSV (CAST SUPPLIES) ×1 IMPLANT
PADDING CAST COTTON 4X4 STRL (CAST SUPPLIES) ×2
PADDING CAST COTTON 6X4 STRL (CAST SUPPLIES) ×3 IMPLANT
PROTECTOR NERVE ULNAR (MISCELLANEOUS) ×3 IMPLANT
SPONGE LAP 18X18 RF (DISPOSABLE) ×3 IMPLANT
STAPLER VISISTAT 35W (STAPLE) IMPLANT
STOCKINETTE 8 INCH (MISCELLANEOUS) ×3 IMPLANT
SUT ETHILON 2 0 PS N (SUTURE) ×18 IMPLANT
SUT ETHILON 2 0 PSLX (SUTURE) IMPLANT
SUT SILK 2 0 (SUTURE) ×2
SUT SILK 2 0 SH CR/8 (SUTURE) ×3 IMPLANT
SUT SILK 2-0 18XBRD TIE 12 (SUTURE) ×1 IMPLANT
SUT VIC AB 0 CT1 36 (SUTURE) ×9 IMPLANT
SUT VIC AB 2-0 CT1 27 (SUTURE) ×6
SUT VIC AB 2-0 CT1 TAPERPNT 27 (SUTURE) ×3 IMPLANT
SWAB COLLECTION DEVICE MRSA (MISCELLANEOUS) IMPLANT
SWAB CULTURE ESWAB REG 1ML (MISCELLANEOUS) IMPLANT
TOWEL OR 17X26 10 PK STRL BLUE (TOWEL DISPOSABLE) ×3 IMPLANT
TRAY PREP A LATEX SAFE STRL (SET/KITS/TRAYS/PACK) ×3 IMPLANT
WATER STERILE IRR 1000ML POUR (IV SOLUTION) ×3 IMPLANT
YANKAUER SUCT BULB TIP 10FT TU (MISCELLANEOUS) ×3 IMPLANT

## 2018-08-06 NOTE — Brief Op Note (Signed)
08/06/2018  5:33 PM  PATIENT:  Bruce Deleon  63 y.o. male  PRE-OPERATIVE DIAGNOSIS:  infection, necrosis left foot  POST-OPERATIVE DIAGNOSIS:  infection, necrosis left foot  PROCEDURE:  Procedure(s): LEFT BELOW KNEE AMPUTATION (Left)  SURGEON:  Surgeon(s) and Role:    Kathryne Hitch, MD - Primary  PHYSICIAN ASSISTANT: Rexene Edison, PA-C  ANESTHESIA:   general  EBL:  50 mL   COUNTS:  YES  TOURNIQUET:   Total Tourniquet Time Documented: Thigh (Left) - 21 minutes Total: Thigh (Left) - 21 minutes   DICTATION: .Other Dictation: Dictation Number [id number]  PLAN OF CARE: Admit to inpatient   PATIENT DISPOSITION:  PACU - hemodynamically stable.   Delay start of Pharmacological VTE agent (>24hrs) due to surgical blood loss or risk of bleeding: no

## 2018-08-06 NOTE — Anesthesia Preprocedure Evaluation (Addendum)
Anesthesia Evaluation  Patient identified by MRN, date of birth, ID band Patient awake    Reviewed: Allergy & Precautions, NPO status , Patient's Chart, lab work & pertinent test results  Airway Mallampati: II  TM Distance: >3 FB Neck ROM: Full    Dental  (+) Missing, Poor Dentition, Edentulous Upper Full beard:   Pulmonary former smoker,    Pulmonary exam normal breath sounds clear to auscultation       Cardiovascular hypertension, Normal cardiovascular exam Rhythm:Regular Rate:Normal     Neuro/Psych ALS (amyotrophic lateral sclerosis)  negative psych ROS   GI/Hepatic negative GI ROS, Neg liver ROS,   Endo/Other  negative endocrine ROS  Renal/GU negative Renal ROS     Musculoskeletal negative musculoskeletal ROS (+)   Abdominal   Peds  Hematology  (+) anemia ,   Anesthesia Other Findings infection, necrosis left foot  Reproductive/Obstetrics                            Anesthesia Physical Anesthesia Plan  ASA: III  Anesthesia Plan: General   Post-op Pain Management:    Induction: Intravenous  PONV Risk Score and Plan: 2 and 3 and Midazolam, Dexamethasone, Ondansetron and Treatment may vary due to age or medical condition  Airway Management Planned: LMA  Additional Equipment:   Intra-op Plan:   Post-operative Plan: Extubation in OR  Informed Consent: I have reviewed the patients History and Physical, chart, labs and discussed the procedure including the risks, benefits and alternatives for the proposed anesthesia with the patient or authorized representative who has indicated his/her understanding and acceptance.     Dental advisory given  Plan Discussed with: CRNA  Anesthesia Plan Comments: [place] )        Anesthesia Quick Evaluation

## 2018-08-06 NOTE — Progress Notes (Signed)
Patient ID: Bruce Deleon, male   DOB: May 22, 1956, 63 y.o.   MRN: [id number] The patient understands fully that he has such a severe infection of his left foot and ankle along with worsening soft-tissue necrosis that we have recommended a below knee amputation on the left side.  The reasoning behind proceeding with this operation has been discussed in great detail over the last two days.  The risks and benefits have been understood fully and informed consent is obtained.

## 2018-08-06 NOTE — Anesthesia Procedure Notes (Signed)
Procedure Name: LMA Insertion °Performed by: Yannis Broce J, CRNA °Pre-anesthesia Checklist: Patient identified, Emergency Drugs available, Suction available, Patient being monitored and Timeout performed °Patient Re-evaluated:Patient Re-evaluated prior to induction °Oxygen Delivery Method: Circle system utilized °Preoxygenation: Pre-oxygenation with 100% oxygen °Induction Type: IV induction °Ventilation: Mask ventilation with difficulty °LMA: LMA inserted °LMA Size: 4.0 °Number of attempts: 1 °Placement Confirmation: positive ETCO2 and breath sounds checked- equal and bilateral °Tube secured with: Tape °Dental Injury: Teeth and Oropharynx as per pre-operative assessment  ° ° ° ° ° ° °

## 2018-08-06 NOTE — Anesthesia Postprocedure Evaluation (Addendum)
Anesthesia Post Note  Patient: Bruce Deleon  Procedure(s) Performed: LEFT BELOW KNEE AMPUTATION (Left Leg Lower)     Patient location during evaluation: PACU Anesthesia Type: General Level of consciousness: awake and alert Pain management: pain level controlled Vital Signs Assessment: post-procedure vital signs reviewed and stable Respiratory status: spontaneous breathing, nonlabored ventilation, respiratory function stable and patient connected to nasal cannula oxygen Cardiovascular status: blood pressure returned to baseline and stable Postop Assessment: no apparent nausea or vomiting Anesthetic complications: no    Last Vitals:  Vitals:   08/06/18 1815 08/06/18 1833  BP: (!) 140/44 (!) 141/81  Pulse: 70 67  Resp: 13 14  Temp:  36.6 C  SpO2: 95% 95%    Last Pain:  Vitals:   08/06/18 1846  TempSrc:   PainSc: 10-Worst pain ever                 Ryan P Ellender

## 2018-08-06 NOTE — Transfer of Care (Signed)
Immediate Anesthesia Transfer of Care Note  Patient: Bruce Deleon  Procedure(s) Performed: LEFT BELOW KNEE AMPUTATION (Left Leg Lower)  Patient Location: PACU  Anesthesia Type:General  Level of Consciousness: awake, alert  and oriented  Airway & Oxygen Therapy: Patient Spontanous Breathing and Patient connected to face mask oxygen  Post-op Assessment: Report given to RN and Post -op Vital signs reviewed and stable  Post vital signs: Reviewed and stable  Last Vitals:  Vitals Value Taken Time  BP    Temp    Pulse 85 08/06/2018  5:38 PM  Resp 15 08/06/2018  5:38 PM  SpO2 94 % 08/06/2018  5:38 PM  Vitals shown include unvalidated device data.  Last Pain:  Vitals:   08/06/18 1404  TempSrc:   PainSc: 3       Patients Stated Pain Goal: 2 (08/04/18 2132)  Complications: No apparent anesthesia complications

## 2018-08-06 NOTE — Plan of Care (Signed)
  Problem: Pain Managment: Goal: General experience of comfort will improve Outcome: Progressing   Problem: Physical Regulation: Goal: Postoperative complications will be avoided or minimized Outcome: Progressing   Problem: Health Behavior/Discharge Planning: Goal: Ability to manage health-related needs will improve Outcome: Progressing

## 2018-08-06 NOTE — Op Note (Signed)
[name]: Deleon Deleon MEDICAL RECORD [id number] ACCOUNT [id number] DATE OF BIRTH:August 17, 1955 FACILITY: [facility] LOCATION: [facility] PHYSICIAN:Lamarkus Nebel [name]. Edwin Baines, MD  OPERATIVE REPORT  DATE OF PROCEDURE:  08/06/2018  PREOPERATIVE DIAGNOSIS:  Continued left foot and ankle infection with necrotic tissue and necrotizing fasciitis, status post irrigation and debridement x2.  POSTOPERATIVE DIAGNOSIS:  Continued left foot and ankle infection with necrotic tissue and necrotizing fasciitis, status post irrigation and debridement x2.  PROCEDURE:  Left below-knee amputation.  SURGEON:  Vanita PandaChristopher Y. Magnus IvanBlackman, MD  ASSISTANT:  Deleon CanalGilbert Clark, PA-C  ANESTHESIA:  General.  ANTIBIOTICS:  The patient is on IV antibiotics preoperative.  TOURNIQUET TIME:  Under 1 hour.  ESTIMATED BLOOD LOSS:  Less than 100 mL.  COMPLICATIONS:  None.  INDICATIONS:  The patient is a 63 year old gentleman who unfortunately has ALS.  He also has presented to the emergency room last Sunday with overwhelming infection involving his left foot and ankle.  This started at the first MTP joint and he had air in  the soft tissue with crackling tissue and obvious necrotizing fasciitis tracking of his medial ankle.  We took him to the OR emergently Sunday evening and performed an extensive and wide debridement of his ankle and foot.  We also had to remove the  first and second rays.  We then had him on antibiotics for several days and took him back to the OR on Wednesday of this week.  We found worsening necrosis of the medial and lateral ankle as well as tissue underneath the foot.  We performed another wide  extensive debridement.  At this point, there is not good enough tissue for any type of coverage.  I did place a VAC over the wounds and then have talked to the family for the last 2 days as well as the patient about a below-knee amputation.  He is  staying with a persistently high white blood cell count as well.  They are  fully aware of the need to have the surgery done because this is going to be a life threatening situation at this point.  PROCEDURE:  After informed consent was obtained and appropriate left foot and ankle were marked.  He was brought to the operating room and placed supine on the operating table.  General anesthesia was then obtained.  A bump was placed under his left hip  and a nonsterile tourniquet was placed on his upper left thigh.  His left leg from the thigh down to the ankle was prepped and draped with Betadine and a sterile stockinette.  A time-out was called to identify correct patient, correct left leg.  We then  had the tourniquet inflated to 300 mm of pressure.  I then made an incision distal to the tibial tubercle and viable tissue transverse across the tibia and carried this in a fish mouth format down the back of the leg, so we could have good flap tissue.   Once we got down through there, we cleaned the periosteum off the proximal tibia and then made our saw cut through the tibia and the fibula just slightly proximal to the tibia cut.  We then beveled into the tibia.  We then used an amputation blade and a  random blade hugging the back side of the tibia and fibula.  We then passed the leg off the table.  We were then able to rearrange the soft tissue and secure a flap from bringing the fish mouth from posterior to anterior with good fascial plane.  We  then  identified all neurovascular bundles and cut the nerves to retract back up into the leg and tied off the arteries and veins with interrupted 2-0 silk suture.  We then let the tourniquet down.  Hemostasis was obtained.  We irrigated the leg with normal  saline solution and then reapproximated our deep tissue flap over the bone with interrupted 0 Vicryl suture followed by 2-0 Vicryl subcutaneous tissue and interrupted 2-0 nylon in the skin.  A Jones-type dressing from his thigh down to his amputation  stump was placed after placing  Xeroform and well-padded sterile dressing as well to keep his knee is extended.  He was then awakened, extubated, and taken to recovery room in stable condition.  All final counts were correct.  There were no complications  noted.  Of note, Deleon Edison, PA-C, assisted the entire case.  His assistance helped facilitate all aspects of this case.  TN/NUANCE  D:08/06/2018 T:08/06/2018 JOB:[id number]

## 2018-08-06 NOTE — Progress Notes (Signed)
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  Status post first and second ray amputation of left foot and extensive I&D of left medial ankle and foot on 1/19, and repeat I&D on 1/22.  Started on vancomycin, Zosyn and clindamycin on 1/19.  Clindamycin was stopped earlier.  Vancomycin stopped on 1/22-wound culture did not grow MRSA.  Subjective: No major events overnight.  Reports some shortness of breath.  Denies chest pain, abdominal pain.  Surgical pain well controlled.  Assessment & Plan: Principal Problem:   Left foot and ankle infection Active Problems:   Osteomyelitis (HCC)  Left foot infection/gas gangrene of left medial ankle/necrotic first and second toes:  -Orthopedic surgery following- plan for BKA this afternoon -First and second ray amputation of left foot 1/19 -I&D of left medial ankle and foot on 1/19 and 1/22 -Blood cultures negative -Wound culture grew MSSA -Vancomycin 1/19-1/22 -Clindamycin 1/19-1/21 -Zosyn 1/19 -Repeat I&D on 1/22.  Leukocytosis: Remains elevated.  -Antibiotic above -Continue trending  Mild hypokalemia: Resolved.  Magnesium within normal range. -recheck  Hypertension: SBP in 150s. -Started low-dose amlodipine -PRN Hydralazine  ALS with invasive ventilation with trilogy: some shortness of breathe or wheezing today. Wheelchair-bound -Scheduled and PRN DuoNeb -Budesonide  Protein calorie malnutrition -Appreciate nutrition input  Scheduled Meds: . [MAR Hold] amLODipine  5 mg Oral Daily  . [MAR Hold] budesonide (PULMICORT) nebulizer solution  0.25 mg Nebulization BID  . [MAR Hold] heparin   5,000 Units Subcutaneous Q8H  . [MAR Hold] ipratropium-albuterol  3 mL Nebulization Q6H   Continuous Infusions: . sodium chloride 100 mL/hr at 08/06/18 0639  . acetaminophen    . lactated ringers 10 mL/hr at 08/06/18 1500  . [MAR Hold] piperacillin-tazobactam (ZOSYN)  IV 3.375 g (08/06/18 0628)   PRN Meds:.[MAR Hold] alum & mag hydroxide-simeth, [MAR Hold] hydrALAZINE, [MAR Hold]  HYDROmorphone (DILAUDID) injection, [MAR Hold] ipratropium-albuterol, [MAR Hold] ondansetron (ZOFRAN) IV, [MAR Hold] oxyCODONE  DVT prophylaxis: SCD  Code Status: Full code Family Communication: Wife at bedside. Disposition Plan: Remains inpatient.  Plan for left BKA today  Consultants:   Orthopedic surgery  Procedures:   First and second ray amputation of left foot on 1/19  I&D of the left medial ankle on 1/19 and 1/22  Antimicrobials:  As above  Objective: Vitals:   08/06/18 0700 08/06/18 0911 08/06/18 1312 08/06/18 1420  BP: 134/65 (!) 145/69 (!) 147/70   Pulse: 80 79 74   Resp: 18 16 19    Temp: 98.1 F (36.7 C) 98.1 F (36.7 C) 98.9 F (37.2 C)   TempSrc: Oral Oral Oral   SpO2: 94% 94% 93% 94%  Weight:      Height:        Intake/Output Summary (Last 24 hours) at 08/06/2018 1538 Last data filed at 08/06/2018 1352 Gross per 24 hour  Intake 3238.04 ml  Output 1325 ml  Net 1913.04 ml   Filed Weights   08/01/18 1741 08/02/18 0035 08/04/18 1658  Weight: 72.6 kg 67.5 kg 67.5 kg    Examination: GENERAL: Appears well. No acute distress.  HEENT: MMM.  Vision and Hearing grossly intact.  NECK: Supple.  No JVD.  LUNGS:  No IWOB. Good air  movement.  End expiratory wheezing HEART:  RRR.  1/6 SEM over RUSB ABD: Bowel sounds present. Soft. Non tender.  EXT: Wound VAC to left foot.  No notable erythema or skin infection proximally. SKIN: no apparent skin lesion.  NEURO: Awake, alert and oriented appropriately.  No gross deficit.  PSYCH: Calm. Normal affect.  Data Reviewed: I have  independently reviewed following labs and imaging studies  CBC: Recent Labs  Lab 08/02/18 0143 08/03/18 0428 08/04/18 0410 08/05/18 0511 08/06/18 0417  WBC 27.9* 36.8* 21.1* 21.0* 22.7*  NEUTROABS 24.9* 32.3* 17.6* 19.1* 19.1*  HGB 12.4* 13.2 12.8* 13.0 12.5*  HCT 39.2 42.7 40.8 41.8 40.2  MCV 95.8 95.5 95.3 95.0 96.2  PLT 391 447* 473* 493* 409*   Basic Metabolic Panel: Recent Labs  Lab 08/01/18 1751 08/02/18 0143 08/03/18 0428 08/04/18 0410 08/05/18 0511 08/06/18 0417  NA 135 137 134* 137 137  --   K 3.3* 3.7 3.6 3.4* 3.9  --   CL 94* 107 101 105 102  --   CO2 26 20* 24 22 24   --   GLUCOSE 103* 108* 102* 83 100*  --   BUN 29* 27* 21 17 13   --   CREATININE 0.90 0.77 0.65 0.66 0.68  --   CALCIUM 8.7* 7.8* 7.9* 7.9* 7.9*  --   MG  --  2.4 2.4 2.4 2.1 2.1  PHOS  --   --   --  2.1*  --   --    GFR: Estimated Creatinine Clearance: 90.2 mL/min (by C-G formula based on SCr of 0.68 mg/dL). Liver Function Tests: Recent Labs  Lab 08/01/18 1751 08/03/18 0428 08/04/18 0410  AST 38 24  --   ALT 30 23  --   ALKPHOS 89 62  --   BILITOT 1.7* 0.9  --   PROT 7.7 6.2*  --   ALBUMIN 3.0* 2.1* 2.1*   No results for input(s): LIPASE, AMYLASE in the last 168 hours. No results for input(s): AMMONIA in the last 168 hours. Coagulation Profile: Recent Labs  Lab 08/01/18 1751  INR 1.19   Cardiac Enzymes: No results for input(s): CKTOTAL, CKMB, CKMBINDEX, TROPONINI in the last 168 hours. BNP (last 3 results) No results for input(s): PROBNP in the last 8760 hours. HbA1C: No results for input(s): HGBA1C in the last 72 hours. CBG: No results for input(s): GLUCAP in the last 168 hours. Lipid Profile: No results for input(s): CHOL, HDL, LDLCALC, TRIG, CHOLHDL, LDLDIRECT in the last 72 hours. Thyroid Function Tests: No results for input(s): TSH, T4TOTAL, FREET4, T3FREE, THYROIDAB in the last 72 hours. Anemia Panel: No results for input(s): VITAMINB12, FOLATE, FERRITIN, TIBC,  IRON, RETICCTPCT in the last 72 hours. Urine analysis: No results found for: COLORURINE, APPEARANCEUR, LABSPEC, PHURINE, GLUCOSEU, HGBUR, BILIRUBINUR, KETONESUR, PROTEINUR, UROBILINOGEN, NITRITE, LEUKOCYTESUR Sepsis Labs: Invalid input(s): PROCALCITONIN, LACTICIDVEN  Recent Results (from the past 240 hour(s))  Culture, blood (Routine x 2)     Status: None   Collection Time: 08/01/18  5:51 PM  Result Value Ref Range Status   Specimen Description   Final    BLOOD LEFT ANTECUBITAL Performed at [facility], [address] [place], [place] [address]    Special Requests   Final    BOTTLES DRAWN AEROBIC AND ANAEROBIC Blood Culture adequate volume Performed at [facility], [address] [place], [place] [address]    Culture   Final    NO GROWTH 5 DAYS Performed at [facility], [address] [place], [place] [address]    Report Status 08/06/2018 FINAL  Final  Culture, blood (Routine x 2)     Status: None   Collection Time: 08/01/18  5:56 PM  Result Value Ref Range Status   Specimen Description   Final    BLOOD BLOOD RIGHT FOREARM Performed at [facility], [address] [place], [place] [address]    Special Requests   Final    BOTTLES DRAWN AEROBIC AND ANAEROBIC Blood Culture adequate volume Performed at [facility], [address] [place], [place] [address]    Culture   Final    NO GROWTH 5 DAYS Performed at [facility], [address] [place], [place] [address]    Report Status 08/06/2018 FINAL  Final  Aerobic/Anaerobic Culture (surgical/deep wound)     Status: None (Preliminary result)   Collection Time: 08/01/18 10:22 PM  Result Value Ref Range Status   Specimen Description WOUND LEFT FOOT  Final   Special Requests   Final    NONE Performed at [facility], [address] [place], [place] [address]    Gram Stain NO ORGANISMS SEEN MODERATE GRAM POSITIVE  COCCI   Final   Culture   Final    MODERATE STAPHYLOCOCCUS AUREUS NO ANAEROBES ISOLATED; CULTURE IN PROGRESS FOR 5 DAYS    Report Status PENDING  Incomplete   Organism ID, Bacteria STAPHYLOCOCCUS AUREUS  Final      Susceptibility   Staphylococcus aureus - MIC*    CIPROFLOXACIN <=0.5 SENSITIVE Sensitive     ERYTHROMYCIN <=0.25 SENSITIVE Sensitive     GENTAMICIN <=0.5 SENSITIVE Sensitive     OXACILLIN 0.5 SENSITIVE Sensitive     TETRACYCLINE <=1 SENSITIVE Sensitive     VANCOMYCIN <=0.5 SENSITIVE Sensitive     TRIMETH/SULFA <=10 SENSITIVE Sensitive     CLINDAMYCIN <=0.25 SENSITIVE Sensitive     RIFAMPIN <=0.5 SENSITIVE Sensitive     Inducible Clindamycin NEGATIVE Sensitive     * MODERATE STAPHYLOCOCCUS AUREUS      Radiology Studies: No results found.   T. [facility] Pager [phone]  If 7PM-7AM, please contact night-coverage [email or web address] Password [email or web address] 08/06/2018, 3:38 PM

## 2018-08-07 ENCOUNTER — Encounter (HOSPITAL_COMMUNITY): Payer: Self-pay | Admitting: Orthopaedic Surgery

## 2018-08-07 LAB — BASIC METABOLIC PANEL
ANION GAP: 11 (ref 5–15)
BUN: 13 mg/dL (ref 8–23)
CO2: 26 mmol/L (ref 22–32)
Calcium: 8 mg/dL — ABNORMAL LOW (ref 8.9–10.3)
Chloride: 100 mmol/L (ref 98–111)
Creatinine, Ser: 0.71 mg/dL (ref 0.61–1.24)
GFR calc Af Amer: >60 mL/min (ref >60)
GFR calc non Af Amer: >60 mL/min (ref >60)
Glucose, Bld: 104 mg/dL — ABNORMAL HIGH (ref 70–99)
POTASSIUM: 3.7 mmol/L (ref 3.5–5.1)
Sodium: 137 mmol/L (ref 135–145)

## 2018-08-07 LAB — AEROBIC/ANAEROBIC CULTURE W GRAM STAIN (SURGICAL/DEEP WOUND): Gram Stain: NONE SEEN

## 2018-08-07 LAB — CBC
HCT: 39.5 % (ref 39.0–52.0)
Hemoglobin: 12.2 g/dL — ABNORMAL LOW (ref 13.0–17.0)
MCH: 29.5 pg (ref 26.0–34.0)
MCHC: 30.9 g/dL (ref 30.0–36.0)
MCV: 95.4 fL (ref 80.0–100.0)
Platelets: 465 10*3/uL — ABNORMAL HIGH (ref 150–400)
RBC: 4.14 MIL/uL — ABNORMAL LOW (ref 4.22–5.81)
RDW: 13.7 % (ref 11.5–15.5)
WBC: 18.3 10*3/uL — ABNORMAL HIGH (ref 4.0–10.5)
nRBC: 0 % (ref 0.0–0.2)

## 2018-08-07 MED ORDER — IPRATROPIUM-ALBUTEROL 0.5-2.5 (3) MG/3ML IN SOLN
3.0000 mL | Freq: Two times a day (BID) | RESPIRATORY_TRACT | Status: DC
  Administered 2018-08-07 – 2018-08-10 (×6): 3 mL via RESPIRATORY_TRACT
  Filled 2018-08-07 (×6): qty 3

## 2018-08-07 MED ORDER — KETOROLAC TROMETHAMINE 15 MG/ML IJ SOLN
15.0000 mg | Freq: Four times a day (QID) | INTRAMUSCULAR | Status: AC
  Administered 2018-08-07 (×3): 15 mg via INTRAVENOUS
  Filled 2018-08-07 (×3): qty 1

## 2018-08-07 MED ORDER — GABAPENTIN 100 MG PO CAPS
100.0000 mg | ORAL_CAPSULE | Freq: Three times a day (TID) | ORAL | Status: DC
  Administered 2018-08-07 – 2018-08-10 (×10): 100 mg via ORAL
  Filled 2018-08-07 (×10): qty 1

## 2018-08-07 NOTE — Evaluation (Signed)
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  Pt currently with functional limitations due to the deficits listed below (see OT Problem List).  Pt will benefit from skilled OT to increase their safety and independence with ADL and functional mobility for ADL to facilitate discharge to venue listed below.      Follow Up Recommendations  SNF;Supervision/Assistance - 24 hour    Equipment Recommendations  3 in 1 bedside commode;Other (comment);Wheelchair (measurements OT);Wheelchair cushion (measurements OT)(TBD in next venue, may require drop arm BSC)    Recommendations for Other Services       Precautions / Restrictions Precautions Precautions: Fall Restrictions Weight Bearing Restrictions: Yes LLE Weight Bearing: Non weight bearing      Mobility Bed Mobility Overal bed mobility: Needs Assistance Bed Mobility: Supine to Sit     Supine to sit: Min assist     General bed mobility comments: light assist to min/guard to safely elevate trunk and scoot to EOB, incr time needed  Transfers Overall transfer level: Needs assistance Equipment used: None Transfers: Lateral/Scoot Transfers          Lateral/Scoot Transfers: Mod assist General transfer comment: VC for sequencing and safety    Balance Overall balance assessment: Needs assistance Sitting-balance support: Single extremity supported;Feet supported;No upper extremity supported Sitting balance-Leahy Scale: Fair Sitting balance - Comments: ultimately requires at least single UE support for sitting balance, noticed slight  posterior lean during UE assessment       Standing balance comment: NT                           ADL either performed or assessed with clinical judgement   ADL Overall ADL's : Needs assistance/impaired Eating/Feeding: Set up;Sitting Eating/Feeding Details (indicate cue type and reason): pt reports significant difficulty with self-feeding. observed pt hold cup as well as spoon.  Pt did not seem interested in adaptive silverware but will attempt to educate                                                      Pertinent Vitals/Pain Pain Assessment: Faces Pain Score: 4  Faces Pain Scale: Hurts little more Pain Location: LLE Pain Descriptors / Indicators: Discomfort;Sore Pain Intervention(s): Monitored during session;Premedicated before session;Repositioned     Hand Dominance     Extremity/Trunk Assessment Upper Extremity Assessment Upper Extremity Assessment: Defer to OT evaluation RUE Deficits / Details: grossly 3/5 throughout shoulder and elbow, significant weakness due to baseline ALS, decreased digit ROM and weak grip RUE Coordination: decreased fine motor LUE Deficits / Details: grossly 3/5 throughout, digit ROM and grip strength better in LUE compared to RUE LUE Coordination: decreased fine motor   Lower Extremity Assessment LLE Deficits / Details: significant muscle atrophy throughout bil LEs, (new) BKA AROM grossly WFL; strength grossly 2+/5, limited by post op pain       Communication Communication Communication: No  difficulties   Cognition Arousal/Alertness: Awake/alert Behavior During Therapy: WFL for tasks assessed/performed Overall Cognitive Status: Within Functional Limits for tasks assessed                                                Home Living Family/patient expects to be discharged to:: Skilled nursing facility Living Arrangements: Spouse/significant other;Parent Available Help at Discharge:  Family Type of Home: House Home Access: Stairs to enter [employer] of Steps: 2   Home Layout: One level     Bathroom Shower/Tub: Chief Strategy Officer: Standard     Home Equipment: Environmental consultant - 2 wheels;Wheelchair - manual   Additional Comments: pt reports that his wife and his aunt (that lives nearby) assist with errands, meals, laundry etc; reports he has most recently been staying with his mom to assist her       Prior Functioning/Environment Level of Independence: Independent with assistive device(s)        Comments: amb with RW until last few wks        OT Problem List: Decreased strength;Decreased range of motion;Impaired balance (sitting and/or standing);Decreased activity tolerance;Pain;Decreased knowledge of use of DME or AE      OT Treatment/Interventions: Self-care/ADL training;Therapeutic exercise;DME and/or AE instruction;Patient/family education;Balance training    OT Goals(Current goals can be found in the care plan section) Acute Rehab OT Goals Patient Stated Goal: have ramp built at home OT Goal Formulation: With patient Time For Goal Achievement: 08/17/18 Potential to Achieve Goals: Good  OT Frequency: Min 2X/week              AM-PAC OT "6 Clicks" Daily Activity     Outcome Measure Help from another person eating meals?: A Little Help from another person taking care of personal grooming?: A Little Help from another person toileting, which includes using toliet, bedpan, or urinal?: A Lot Help from another person bathing (including washing, rinsing, drying)?: A Lot Help from another person to put on and taking off regular upper body clothing?: A Little Help from another person to put on and taking off regular lower body clothing?: A Lot 6 Click Score: 15   End of Session Nurse Communication: Mobility status  Activity Tolerance: Patient tolerated treatment well Patient left: with call bell/phone within reach;in chair;with  family/visitor present  OT Visit Diagnosis: Other abnormalities of gait and mobility (R26.89);Muscle weakness (generalized) (M62.81)                Time: 9629-5284 OT Time Calculation (min): 14 min Charges:  OT General Charges $OT Visit: 1 Visit OT Evaluation $OT Re-eval: 1 Re-eval  Lise Auer, OT Acute Rehabilitation Services [phone] Office- [phone]     Bruce Deleon, Karin Golden D 08/07/2018, 1:26 PM

## 2018-08-07 NOTE — Evaluation (Signed)
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 Pt currently with functional limitations due to the deficits listed below (see PT Problem List).  Pt agreeable to get up to chair with encouragement; able to perform scooting transfer bed to chair with min assist, 2-3/4 dyspnea with SpO2 = 96-94% on RA (1L O2 removed and RN notified of need for spot check); pt will benefit from SNF placement post acute; will continue to folllow  Pt will benefit from skilled PT to increase their independence and safety with mobility to allow discharge to the venue listed below.       Follow Up Recommendations SNF    Equipment Recommendations  None recommended by PT    Recommendations for Other Services       Precautions / Restrictions Precautions Precautions: Fall Restrictions Weight Bearing Restrictions: Yes LLE Weight Bearing: Non weight bearing      Mobility  Bed Mobility Overal bed mobility: Needs Assistance Bed Mobility: Supine to Sit     Supine to sit: Min assist     General bed mobility comments: light assist to min/guard to safely elevate trunk and scoot to EOB, incr time needed  Transfers Overall transfer level: Needs assistance Equipment used: None Transfers: Lateral/Scoot Transfers          Lateral/Scoot Transfers: Mod assist General transfer comment: VC for sequencing and safety  Ambulation/Gait             General Gait Details: NT  Stairs            Wheelchair Mobility    Modified Rankin (Stroke Patients Only)       Balance Overall balance assessment: Needs  assistance Sitting-balance support: Single extremity supported;Feet supported;No upper extremity supported Sitting balance-Leahy Scale: Fair Sitting balance - Comments: ultimately requires at least single UE support for sitting balance, noticed slight posterior lean during UE assessment       Standing balance comment: NT                             Pertinent Vitals/Pain Pain Assessment: Faces Pain Score: 4  Faces Pain Scale: Hurts little more Pain Location: LLE Pain Descriptors / Indicators: Discomfort;Sore Pain Intervention(s): Monitored during session;Premedicated before session;Repositioned    Home Living Family/patient expects to be discharged to:: Skilled nursing facility Living Arrangements: Spouse/significant other;Parent Available Help at Discharge: Family Type of Home: House Home Access: Stairs to enter   [employer] of Steps: 2 Home Layout: One level Home Equipment: Environmental consultant - 2 wheels;Wheelchair - manual Additional Comments: pt reports that his wife and his aunt (that lives nearby) assist with errands, meals, laundry etc; reports he has most recently been staying with his mom to assist her     Prior Function Level of Independence: Independent with assistive device(s)         Comments: amb with RW until last few wks     Hand Dominance        Extremity/Trunk Assessment   Upper Extremity Assessment Upper Extremity Assessment: Defer to OT evaluation RUE Deficits / Details: grossly 3/5 throughout shoulder  and elbow, significant weakness due to baseline ALS, decreased digit ROM and weak grip RUE Coordination: decreased fine motor LUE Deficits / Details: grossly 3/5 throughout, digit ROM and grip strength better in LUE compared to RUE LUE Coordination: decreased fine motor    Lower Extremity Assessment LLE Deficits / Details: significant muscle atrophy throughout bil LEs, (new) BKA AROM grossly WFL; strength grossly 2+/5, limited by post  op pain       Communication   Communication: No difficulties  Cognition Arousal/Alertness: Awake/alert Behavior During Therapy: WFL for tasks assessed/performed Overall Cognitive Status: Within Functional Limits for tasks assessed                                        General Comments      Exercises     Assessment/Plan    PT Assessment Patient needs continued PT services  PT Problem List Decreased strength;Decreased mobility;Decreased balance;Decreased activity tolerance;Decreased knowledge of use of DME;Pain       PT Treatment Interventions DME instruction;Functional mobility training;Patient/family education;Therapeutic activities;Therapeutic exercise    PT Goals (Current goals can be found in the Care Plan section)  Acute Rehab PT Goals Patient Stated Goal: have ramp built at home; go to rehab and get stronger PT Goal Formulation: With patient Time For Goal Achievement: 08/21/18 Potential to Achieve Goals: Fair    Frequency Min 3X/week   Barriers to discharge        Co-evaluation               AM-PAC PT "6 Clicks" Mobility  Outcome Measure Help needed turning from your back to your side while in a flat bed without using bedrails?: A Little Help needed moving from lying on your back to sitting on the side of a flat bed without using bedrails?: A Little Help needed moving to and from a bed to a chair (including a wheelchair)?: A Lot Help needed standing up from a chair using your arms (e.g., wheelchair or bedside chair)?: Total Help needed to walk in hospital room?: Total Help needed climbing 3-5 steps with a railing? : Total 6 Click Score: 11    End of Session   Activity Tolerance: Patient tolerated treatment well Patient left: with call bell/phone within reach;in chair;with family/visitor present   PT Visit Diagnosis: Muscle weakness (generalized) (M62.81);Other abnormalities of gait and mobility (R26.89)    Time: 5053-9767 PT  Time Calculation (min) (ACUTE ONLY): 16 min   Charges:   PT Evaluation $PT Re-evaluation: 1 Re-eval          Drucilla Chalet, PT  Pager: [phone] Acute Rehab Dept [facility]): [phone]   08/07/2018   [facility] 08/07/2018, 1:28 PM

## 2018-08-07 NOTE — Progress Notes (Signed)
Patient ID: Bruce Deleon, male   DOB: 1956/05/04, 63 y.o.   MRN: [id number] Tolerated his left BKA yesterday.  Is experiencing phantom pain and I spoke with him about this.  PT/OT has been ordered for mobility and ADL's.  H&H stable.  WBC 18,000.

## 2018-08-07 NOTE — Progress Notes (Signed)
PROGRESS NOTE  Bruce Deleon PYP:[id number] DOB: [birth year] DOA: 08/01/2018 PCP: Seward Meth, MD   LOS: 6 days    Brief Narrative / Interim history: 63 year old male with history of ALS, right-sided paraplegia at baseline, noninvasive ventilation with trilogy at home, hypertension admitted with left arm cold and foot infection with necrotizing fasciitis and necrotic first and second rays. Started on vancomycin, Zosyn and clindamycin on 1/19.  Clindamycin was stopped earlier. Vancomycin stopped on 1/22-wound culture did not grow MRSA. First and second ray amputation of left foot and extensive I&D of left medial ankle and foot on 1/19.  Repeat I&D on 1/22.  Continued to have significant necrotic tissues and leukocytosis despite the above interventions. Had left leg BKA on 1/24  Subjective: Had left BKA yesterday.  Reports significant pain overnight.  Pain has improved this morning.  Breathing improved.  Denies chest pain, dyspnea, dizziness or abdominal pain.  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  Stopped Zosyn after discussion with Dr. August Saucer [facility] orthopedics) -Pain management per orthopedic surgery  Leukocytosis: Improved -Continue trending  Mild hypokalemia: Resolved.  Magnesium within normal range. -Monitor  Hypertension: SBP in 150s. -Started low-dose amlodipine -PRN Hydralazine  ALS with invasive ventilation with trilogy: Breathing improved. -Scheduled and PRN DuoNeb -Budesonide  Protein calorie malnutrition -Appreciate nutrition  input  Scheduled Meds: . amLODipine  5 mg Oral Daily  . budesonide (PULMICORT) nebulizer solution  0.25 mg Nebulization BID  . gabapentin  100 mg Oral TID  . heparin  5,000 Units Subcutaneous Q8H  . ipratropium-albuterol  3 mL Nebulization BID  . ketorolac  15 mg Intravenous Q6H   Continuous Infusions: . sodium chloride 100 mL/hr at 08/06/18 0639  . lactated ringers 10 mL/hr at 08/06/18 1500  . piperacillin-tazobactam (ZOSYN)  IV 12.5 mL/hr at 08/07/18 0600   PRN Meds:.alum & mag hydroxide-simeth, hydrALAZINE, HYDROmorphone (DILAUDID) injection, ipratropium-albuterol, ondansetron (ZOFRAN) IV, oxyCODONE  DVT prophylaxis: SCD  Code Status: Full code Family Communication: Wife at bedside. Disposition Plan: Remains inpatient.   Consultants:   Orthopedic surgery  Procedures:   First and second ray amputation of left foot on 1/19  I&D of the left medial ankle on 1/19 and 1/22  Left leg BKA 1/24  Antimicrobials:  As above  Objective: Vitals:   08/07/18 0210 08/07/18 0527 08/07/18 0911 08/07/18 1008  BP: (!) 143/81 (!) 162/72  (!) 145/74  Pulse: 81 82 74 78  Resp:  16 18 12   Temp: 98.3 F (36.8 C) 97.9 F (36.6 C)  98 F (36.7 C)  TempSrc: Oral Oral  Oral  SpO2: 96% 97% 92% 96%  Weight:      Height:        Intake/Output Summary (Last 24 hours) at 08/07/2018 1210 Last data filed at 08/07/2018 0900 Gross per 24 hour  Intake 2294.29 ml  Output 1530 ml  Net 764.29 ml   Filed Weights   08/01/18 1741 08/02/18 0035 08/04/18 1658  Weight: 72.6 kg 67.5 kg 67.5 kg    Examination: GENERAL: Appears well. No acute distress.  HEENT: MMM.  Vision and Hearing grossly  intact.  NECK: Supple.  No JVD.  LUNGS:  No IWOB. Good air movement.  Mild end expiratory wheeze. HEART:  RRR. Heart sounds normal.  ABD: Bowel sounds present. Soft. Non tender.  EXT:   Left leg BKA.  Ace wrap.  No swelling or erythema proximally. SKIN: no apparent skin lesion.  NEURO: Awake, alert and  oriented appropriately.  No gross deficit.  PSYCH: Calm. Normal affect.  Data Reviewed: I have independently reviewed following labs and imaging studies  CBC: Recent Labs  Lab 08/02/18 0143 08/03/18 0428 08/04/18 0410 08/05/18 0511 08/06/18 0417 08/07/18 0342  WBC 27.9* 36.8* 21.1* 21.0* 22.7* 18.3*  NEUTROABS 24.9* 32.3* 17.6* 19.1* 19.1*  --   HGB 12.4* 13.2 12.8* 13.0 12.5* 12.2*  HCT 39.2 42.7 40.8 41.8 40.2 39.5  MCV 95.8 95.5 95.3 95.0 96.2 95.4  PLT 391 447* 473* 493* 409* 465*   Basic Metabolic Panel: Recent Labs  Lab 08/02/18 0143 08/03/18 0428 08/04/18 0410 08/05/18 0511 08/06/18 0417 08/07/18 0342  NA 137 134* 137 137  --  137  K 3.7 3.6 3.4* 3.9  --  3.7  CL 107 101 105 102  --  100  CO2 20* 24 22 24   --  26  GLUCOSE 108* 102* 83 100*  --  104*  BUN 27* 21 17 13   --  13  CREATININE 0.77 0.65 0.66 0.68  --  0.71  CALCIUM 7.8* 7.9* 7.9* 7.9*  --  8.0*  MG 2.4 2.4 2.4 2.1 2.1  --   PHOS  --   --  2.1*  --   --   --    GFR: Estimated Creatinine Clearance: 90.2 mL/min (by C-G formula based on SCr of 0.71 mg/dL). Liver Function Tests: Recent Labs  Lab 08/01/18 1751 08/03/18 0428 08/04/18 0410  AST 38 24  --   ALT 30 23  --   ALKPHOS 89 62  --   BILITOT 1.7* 0.9  --   PROT 7.7 6.2*  --   ALBUMIN 3.0* 2.1* 2.1*   No results for input(s): LIPASE, AMYLASE in the last 168 hours. No results for input(s): AMMONIA in the last 168 hours. Coagulation Profile: Recent Labs  Lab 08/01/18 1751  INR 1.19   Cardiac Enzymes: No results for input(s): CKTOTAL, CKMB, CKMBINDEX, TROPONINI in the last 168 hours. BNP (last 3 results) No results for input(s): PROBNP in the last 8760 hours. HbA1C: No results for input(s): HGBA1C in the last 72 hours. CBG: No results for input(s): GLUCAP in the last 168 hours. Lipid Profile: No results for input(s): CHOL, HDL, LDLCALC, TRIG, CHOLHDL, LDLDIRECT in the last 72 hours. Thyroid Function Tests: No results for  input(s): TSH, T4TOTAL, FREET4, T3FREE, THYROIDAB in the last 72 hours. Anemia Panel: No results for input(s): VITAMINB12, FOLATE, FERRITIN, TIBC, IRON, RETICCTPCT in the last 72 hours. Urine analysis: No results found for: COLORURINE, APPEARANCEUR, LABSPEC, PHURINE, GLUCOSEU, HGBUR, BILIRUBINUR, KETONESUR, PROTEINUR, UROBILINOGEN, NITRITE, LEUKOCYTESUR Sepsis Labs: Invalid input(s): PROCALCITONIN, LACTICIDVEN  Recent Results (from the past 240 hour(s))  Culture, blood (Routine x 2)     Status: None   Collection Time: 08/01/18  5:51 PM  Result Value Ref Range Status   Specimen Description   Final    BLOOD LEFT ANTECUBITAL Performed at [facility], [address] [place], [place] [address]    Special Requests   Final    BOTTLES DRAWN AEROBIC AND ANAEROBIC Blood Culture adequate volume Performed at [facility],  [address], [place], [place] [address]    Culture   Final    NO GROWTH 5 DAYS Performed at [facility], [address], [place], [place] [address]    Report Status 08/06/2018 FINAL  Final  Culture, blood (Routine x 2)     Status: None   Collection Time: 08/01/18  5:56 PM  Result Value Ref Range Status   Specimen Description   Final    BLOOD BLOOD RIGHT FOREARM Performed at [facility], [address] [place], [place] [address]    Special Requests   Final    BOTTLES DRAWN AEROBIC AND ANAEROBIC Blood Culture adequate volume Performed at [facility], [address] [place], [place] [address]    Culture   Final    NO GROWTH 5 DAYS Performed at [facility], [address], [place], [place] [address]    Report Status 08/06/2018 FINAL  Final  Aerobic/Anaerobic Culture (surgical/deep wound)     Status: None   Collection Time: 08/01/18 10:22 PM  Result Value Ref Range Status   Specimen Description WOUND LEFT FOOT  Final   Special Requests   Final    NONE Performed at [facility], [address], [place], [place] [address]    Gram Stain NO ORGANISMS SEEN MODERATE GRAM POSITIVE COCCI   Final   Culture   Final    MODERATE STAPHYLOCOCCUS AUREUS NO ANAEROBES ISOLATED Performed at [facility], [address] [place], [place] [address]    Report Status 08/07/2018 FINAL  Final   Organism ID, Bacteria STAPHYLOCOCCUS AUREUS  Final      Susceptibility   Staphylococcus aureus - MIC*    CIPROFLOXACIN <=0.5 SENSITIVE Sensitive     ERYTHROMYCIN <=0.25 SENSITIVE Sensitive     GENTAMICIN <=0.5 SENSITIVE Sensitive     OXACILLIN 0.5 SENSITIVE Sensitive     TETRACYCLINE <=1 SENSITIVE Sensitive     VANCOMYCIN <=0.5 SENSITIVE Sensitive     TRIMETH/SULFA <=10 SENSITIVE Sensitive     CLINDAMYCIN <=0.25 SENSITIVE Sensitive     RIFAMPIN <=0.5 SENSITIVE Sensitive     Inducible Clindamycin NEGATIVE Sensitive     * MODERATE STAPHYLOCOCCUS AUREUS      Radiology Studies: No results found.   [facility] Pager [phone]  If 7PM-7AM, please contact night-coverage [email or web address] Password [email or web address] 08/07/2018, 12:10 PM

## 2018-08-08 LAB — BASIC METABOLIC PANEL
Anion gap: 7 (ref 5–15)
BUN: 13 mg/dL (ref 8–23)
CO2: 29 mmol/L (ref 22–32)
Calcium: 7.6 mg/dL — ABNORMAL LOW (ref 8.9–10.3)
Chloride: 101 mmol/L (ref 98–111)
Creatinine, Ser: 0.6 mg/dL — ABNORMAL LOW (ref 0.61–1.24)
GFR calc Af Amer: >60 mL/min (ref >60)
Glucose, Bld: 95 mg/dL (ref 70–99)
POTASSIUM: 3 mmol/L — AB (ref 3.5–5.1)
Sodium: 137 mmol/L (ref 135–145)

## 2018-08-08 LAB — CBC
HEMATOCRIT: 36.7 % — AB (ref 39.0–52.0)
Hemoglobin: 11.5 g/dL — ABNORMAL LOW (ref 13.0–17.0)
MCH: 29.2 pg (ref 26.0–34.0)
MCHC: 31.3 g/dL (ref 30.0–36.0)
MCV: 93.1 fL (ref 80.0–100.0)
PLATELETS: 454 10*3/uL — AB (ref 150–400)
RBC: 3.94 MIL/uL — ABNORMAL LOW (ref 4.22–5.81)
RDW: 13.6 % (ref 11.5–15.5)
WBC: 15.3 10*3/uL — ABNORMAL HIGH (ref 4.0–10.5)
nRBC: 0 % (ref 0.0–0.2)

## 2018-08-08 MED ORDER — POTASSIUM CHLORIDE CRYS ER 20 MEQ PO TBCR
40.0000 meq | EXTENDED_RELEASE_TABLET | ORAL | Status: AC
  Administered 2018-08-08 (×2): 40 meq via ORAL
  Filled 2018-08-08 (×2): qty 2

## 2018-08-08 NOTE — Progress Notes (Signed)
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 Started on vancomycin, Zosyn and clindamycin on 1/19.  Clindamycin was stopped earlier. Vancomycin stopped on 1/22-wound culture did not grow MRSA. First and second ray amputation of left foot and extensive I&D of left medial ankle and foot on 1/19.  Repeat I&D on 1/22.  Continued to have significant necrotic tissues and leukocytosis despite the above interventions. 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  Evaluated by physical and Occupational Therapy who recommended SNF.   Subjective: No major events overnight of this morning.  Pain fairly controlled.  Denies dyspnea, chest pain, abdominal pain.  Reports having small bowel movement last night.  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  Doing well. 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  Stopped Zosyn after discussion with Dr. August Saucer [facility] orthopedics) -Pain management per orthopedic surgery -PT/OT recommended SNF with 3 in 1 bedside commode and wheelchair.  Leukocytosis: Improved -Continue trending  Mild hypokalemia:  -Replace and recheck  Hypertension: SBP in the range of 130-150. -Started low-dose amlodipine -PRN Hydralazine  ALS with invasive  ventilation with trilogy: Breathing improved. -Scheduled and PRN DuoNeb -Budesonide  Protein calorie malnutrition -Appreciate nutrition input  Scheduled Meds: . amLODipine  5 mg Oral Daily  . budesonide (PULMICORT) nebulizer solution  0.25 mg Nebulization BID  . gabapentin  100 mg Oral TID  . heparin  5,000 Units Subcutaneous Q8H  . ipratropium-albuterol  3 mL Nebulization BID   Continuous Infusions: . sodium chloride Stopped (08/07/18 2000)  . lactated ringers Stopped (08/07/18 1220)   PRN Meds:.alum & mag hydroxide-simeth, hydrALAZINE, HYDROmorphone (DILAUDID) injection, ipratropium-albuterol, ondansetron (ZOFRAN) IV, oxyCODONE  DVT prophylaxis: SCD  Code Status: Full code Family Communication: Wife at bedside. Disposition Plan: Remains inpatient.  Final disposition SNF when available  Consultants:   Orthopedic surgery  Procedures:   First and second ray amputation of left foot on 1/19  I&D of the left medial ankle on 1/19 and 1/22  Left leg BKA 1/24  Antimicrobials:  As above  Objective: Vitals:   08/07/18 2206 08/08/18 0546 08/08/18 1003 08/08/18 1039  BP: 136/66 (!) 153/75 (!) 159/72   Pulse: 81 79 87   Resp: 16 14    Temp: 98.2 F (36.8 C) 98.5 F (36.9 C)    TempSrc: Oral Oral    SpO2: 91% 93%  91%  Weight:      Height:        Intake/Output Summary (Last 24 hours) at 08/08/2018 1223 Last data filed at 08/08/2018 1025 Gross per 24 hour  Intake 1050.92 ml  Output 1250 ml  Net -199.08 ml   Filed Weights   08/01/18 1741 08/02/18 0035 08/04/18 1658  Weight: 72.6 kg 67.5 kg 67.5 kg    Examination: GENERAL: Appears well. No acute distress.  HEENT: MMM.  Vision and Hearing grossly intact.  NECK: Supple.  No JVD.  LUNGS:  No IWOB. Good air movement. CTAB.  HEART:  RRR. Heart sounds normal.  ABD: Bowel sounds present. Soft. Non tender.  EXT: Left leg BKA.  Ace wrap in place.  No erythema or swelling proximally. SKIN: no apparent skin lesion.    NEURO: Awake, alert and oriented appropriately.  No gross deficit.  PSYCH: Calm. Normal affect. Data Reviewed: I have independently reviewed following labs and imaging studies  CBC: Recent Labs  Lab 08/02/18 0143 08/03/18 0428 08/04/18 0410 08/05/18 0511 08/06/18 0417 08/07/18 0342 08/08/18 0342  WBC 27.9* 36.8* 21.1* 21.0* 22.7* 18.3* 15.3*  NEUTROABS 24.9* 32.3* 17.6* 19.1* 19.1*  --   --   HGB 12.4* 13.2 12.8* 13.0 12.5* 12.2* 11.5*  HCT 39.2 42.7 40.8 41.8 40.2 39.5 36.7*  MCV 95.8 95.5 95.3 95.0 96.2 95.4 93.1  PLT 391 447* 473* 493* 409* 465* 454*   Basic Metabolic Panel: Recent Labs  Lab 08/02/18 0143 08/03/18 0428 08/04/18 0410 08/05/18 0511 08/06/18 0417 08/07/18 0342 08/08/18 0342  NA 137 134* 137 137  --  137 137  K 3.7 3.6 3.4* 3.9  --  3.7 3.0*  CL 107 101 105 102  --  100 101  CO2 20* 24 22 24   --  26 29  GLUCOSE 108* 102* 83 100*  --  104* 95  BUN 27* 21 17 13   --  13 13  CREATININE 0.77 0.65 0.66 0.68  --  0.71 0.60*  CALCIUM 7.8* 7.9* 7.9* 7.9*  --  8.0* 7.6*  MG 2.4 2.4 2.4 2.1 2.1  --   --   PHOS  --   --  2.1*  --   --   --   --    GFR: Estimated Creatinine Clearance: 90.2 mL/min (A) (by C-G formula based on SCr of 0.6 mg/dL (L)). Liver Function Tests: Recent Labs  Lab 08/01/18 1751 08/03/18 0428 08/04/18 0410  AST 38 24  --   ALT 30 23  --   ALKPHOS 89 62  --   BILITOT 1.7* 0.9  --   PROT 7.7 6.2*  --   ALBUMIN 3.0* 2.1* 2.1*   No results for input(s): LIPASE, AMYLASE in the last 168 hours. No results for input(s): AMMONIA in the last 168 hours. Coagulation Profile: Recent Labs  Lab 08/01/18 1751  INR 1.19   Cardiac Enzymes: No results for input(s): CKTOTAL, CKMB, CKMBINDEX, TROPONINI in the last 168 hours. BNP (last 3 results) No results for input(s): PROBNP in the last 8760 hours. HbA1C: No results for input(s): HGBA1C in the last 72 hours. CBG: No results for input(s): GLUCAP in the last 168 hours. Lipid Profile: No  results for input(s): CHOL, HDL, LDLCALC, TRIG, CHOLHDL, LDLDIRECT in the last 72 hours. Thyroid Function Tests: No results for input(s): TSH, T4TOTAL, FREET4, T3FREE, THYROIDAB in the last 72 hours. Anemia Panel: No results for input(s): VITAMINB12, FOLATE, FERRITIN, TIBC, IRON, RETICCTPCT in the last 72 hours. Urine analysis: No results found for: COLORURINE, APPEARANCEUR, LABSPEC, PHURINE, GLUCOSEU, HGBUR, BILIRUBINUR, KETONESUR, PROTEINUR, UROBILINOGEN, NITRITE, LEUKOCYTESUR Sepsis Labs: Invalid input(s): PROCALCITONIN, LACTICIDVEN  Recent Results (from the past 240 hour(s))  Culture, blood (Routine x 2)     Status: None   Collection Time: 08/01/18  5:51 PM  Result Value Ref Range Status   Specimen Description   Final    BLOOD LEFT ANTECUBITAL Performed at [facility], [address]  [place], [place] [address]    Special Requests   Final    BOTTLES DRAWN AEROBIC AND ANAEROBIC Blood Culture adequate volume Performed at [facility], [address] [place], [place] [address]    Culture   Final    NO GROWTH 5 DAYS Performed at [facility], [address] [place], [place] [address]    Report Status 08/06/2018 FINAL  Final  Culture, blood (Routine x 2)     Status: None   Collection Time: 08/01/18  5:56 PM  Result Value Ref Range Status   Specimen Description   Final    BLOOD BLOOD RIGHT FOREARM Performed at [facility], [address] [place], [place] [address]    Special Requests   Final    BOTTLES DRAWN AEROBIC AND ANAEROBIC Blood Culture adequate volume Performed at [facility], [address] [place], [place] [address]    Culture   Final    NO GROWTH 5 DAYS Performed at [facility], [address] [place], [place] [address]    Report Status 08/06/2018 FINAL  Final  Aerobic/Anaerobic Culture (surgical/deep wound)     Status: None   Collection Time: 08/01/18 10:22 PM    Result Value Ref Range Status   Specimen Description WOUND LEFT FOOT  Final   Special Requests   Final    NONE Performed at [facility], [address] [place], [place] [address]    Gram Stain NO ORGANISMS SEEN MODERATE GRAM POSITIVE COCCI   Final   Culture   Final    MODERATE STAPHYLOCOCCUS AUREUS NO ANAEROBES ISOLATED Performed at [facility], [address] [place], [place] [address]    Report Status 08/07/2018 FINAL  Final   Organism ID, Bacteria STAPHYLOCOCCUS AUREUS  Final      Susceptibility   Staphylococcus aureus - MIC*    CIPROFLOXACIN <=0.5 SENSITIVE Sensitive     ERYTHROMYCIN <=0.25 SENSITIVE Sensitive     GENTAMICIN <=0.5 SENSITIVE Sensitive     OXACILLIN 0.5 SENSITIVE Sensitive     TETRACYCLINE <=1 SENSITIVE Sensitive     VANCOMYCIN <=0.5 SENSITIVE Sensitive     TRIMETH/SULFA <=10 SENSITIVE Sensitive     CLINDAMYCIN <=0.25 SENSITIVE Sensitive     RIFAMPIN <=0.5 SENSITIVE Sensitive     Inducible Clindamycin NEGATIVE Sensitive     * MODERATE STAPHYLOCOCCUS AUREUS      Radiology Studies: No results found.  Genevra Orne T. [facility] Pager [phone]  If 7PM-7AM, please contact night-coverage [email or web address] Password [email or web address] 08/08/2018, 12:23 PM

## 2018-08-09 LAB — BASIC METABOLIC PANEL
ANION GAP: 9 (ref 5–15)
BUN: 8 mg/dL (ref 8–23)
CO2: 27 mmol/L (ref 22–32)
Calcium: 7.8 mg/dL — ABNORMAL LOW (ref 8.9–10.3)
Chloride: 98 mmol/L (ref 98–111)
Creatinine, Ser: 0.63 mg/dL (ref 0.61–1.24)
GFR calc Af Amer: >60 mL/min (ref >60)
GLUCOSE: 88 mg/dL (ref 70–99)
Potassium: 3.8 mmol/L (ref 3.5–5.1)
Sodium: 134 mmol/L — ABNORMAL LOW (ref 135–145)

## 2018-08-09 LAB — CBC
HCT: 39.5 % (ref 39.0–52.0)
Hemoglobin: 12.5 g/dL — ABNORMAL LOW (ref 13.0–17.0)
MCH: 29.8 pg (ref 26.0–34.0)
MCHC: 31.6 g/dL (ref 30.0–36.0)
MCV: 94.3 fL (ref 80.0–100.0)
Platelets: 415 10*3/uL — ABNORMAL HIGH (ref 150–400)
RBC: 4.19 MIL/uL — ABNORMAL LOW (ref 4.22–5.81)
RDW: 13.7 % (ref 11.5–15.5)
WBC: 14.3 10*3/uL — ABNORMAL HIGH (ref 4.0–10.5)
nRBC: 0 % (ref 0.0–0.2)

## 2018-08-09 MED ORDER — ALBUTEROL SULFATE HFA 108 (90 BASE) MCG/ACT IN AERS: 2.0000 | INHALATION_SPRAY | Freq: Four times a day (QID) | RESPIRATORY_TRACT | 2 refills | Status: DC | PRN

## 2018-08-09 MED ORDER — AMLODIPINE BESYLATE 5 MG PO TABS: 5.0000 mg | ORAL_TABLET | Freq: Every day | ORAL | 0 refills | Status: DC

## 2018-08-09 MED ORDER — ACETAMINOPHEN 325 MG PO TABS: 650.0000 mg | ORAL_TABLET | Freq: Four times a day (QID) | ORAL | 0 refills | Status: AC

## 2018-08-09 MED ORDER — GABAPENTIN 100 MG PO CAPS: 100.0000 mg | ORAL_CAPSULE | Freq: Three times a day (TID) | ORAL | 0 refills | Status: DC

## 2018-08-09 MED ORDER — OXYCODONE HCL 5 MG PO TABS: 5.0000 mg | ORAL_TABLET | Freq: Four times a day (QID) | ORAL | 0 refills | Status: AC | PRN

## 2018-08-09 NOTE — Care Management Important Message (Signed)
Important Message  Patient Details  Name: Bruce Deleon MRN: [id number] Date of Birth: 1956-02-03   Medicare Important Message Given:  Yes    [name] 08/09/2018, 1:40 PMImportant Message  Patient Details  Name: Bruce Deleon MRN: [id number] Date of Birth: 05-Apr-1956   Medicare Important Message Given:  Yes    [name] 08/09/2018, 1:40 PM

## 2018-08-09 NOTE — Discharge Summary (Signed)
Physician Discharge Summary  Bruce RicksMark Lathon ZOX:[id number] DOB: Dec 25, 1955 DOA: 08/01/2018  PCP: Seward MethHess, Terry D, MD  Admit date: 08/01/2018 Discharge date: 08/09/2018  Admitted From: Home Disposition: SNF  Recommendations for Outpatient Follow-up:  1. Follow up with PCP and orthopedic surgery in 1 to 2 weeks 2. Please obtain BMP/CBC in one week 3. Please follow up on the following pending results: None  Equipment/Devices: 3 in 1 bedside commode, wheelchair and wheelchair cushion  Discharge Condition: Stable CODE STATUS: Full code Diet recommendation: Low-salt  HPI: Per Dr. Willaim BanePark "Bruce Deleon is a 63 y.o. male with ALS (mainly R sided paraplegia) and hx of HTN who presents from home with acute swelling and pain over L medial ankle. Patient and wife reports that he has had chronic wounds for several weeks over his left lateral 1st toe MTP and L 2nd toe, but new wound on L medial ankle was noted over the past week. Patient thinks that these wounds initially started as pressure ulcers. Severe throbbing pain over the L medial ankle, especially with palpation. Erythema extending into upper foot over the past few days. Per wife, no other pressure ulcers that she has noticed. She had also mentioned that patient is reticent to see medical providers. Denies fevers or chills. Has not noticed any drainage over the other L foot wounds. Patient reports that he does not use gauze or dressing/wound care for the wounds on his L foot other than changing his socks everyday. Wife also reported that they had acquired a wheelchair recently, but that he has not been able to use it because their house is not wheelchair accessible and has no ramp. Regarding ALS, patient reports that he has been able to ambulate with a walker until the past few weeks when he has been limited due to his L foot pain. Main symptoms are R sided paraplegia and R upper extremity weakness. Denies shortness of breath".   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 Started on vancomycin, Zosyn and clindamycin on 1/19. Orthopedic surgery consulted. Clindamycin was stopped 1/20. Vancomycin stopped on 1/22-wound culture grew MSSA. First and second ray amputation of left foot and extensive I&D of left medial ankle and foot on 1/19. Repeat I&D on 1/22. Continued to have significant necrotic tissues and leukocytosis despite the above interventions. 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 Magnus IvanBlackman and remained stable.  Evaluated by physical and Occupational Therapy who recommended SNF, 3 in 1 bedside commode, wheelchair and wheelchair cushion.   See individual problem list below for more. 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  Doing well. Discharge to SNF. 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  Stopped Zosyn after discussion with Dr. August Saucerean [facility] -Scheduled Tylenol, gabapentin and PRN oxycodone for pain control. -PT/OT recommended SNF with 3 in 1 bedside commode and wheelchair.  Leukocytosis: Improved -Recommend repeat CBC at follow-up.  Mild hypokalemia: Resolved. -Recommend repeat BMP at follow-up.  Hypertension: SBP in the range of 130-150. -Discharge on low-dose amlodipine.  ALS: physical therapy and wheelchair as  above  Wheezing and dyspnea: Improved with breathing treatment. -Discharged on albuterol inhaler as needed  Discharge Instructions   Allergies as of 08/09/2018   No Known  Allergies     Medication List    STOP taking these medications   naproxen sodium 220 MG tablet Commonly known as:  ALEVE     TAKE these medications   acetaminophen 325 MG tablet Commonly known as:  TYLENOL Take 2 tablets (650 mg total) by mouth every 6 (six) hours for 15 days.   albuterol 108 (90 Base) MCG/ACT inhaler Commonly known as:  PROVENTIL HFA;VENTOLIN HFA Inhale 2 puffs into the lungs every 6 (six) hours as needed for wheezing or shortness of breath.   amLODipine 5 MG tablet Commonly known as:  NORVASC Take 1 tablet (5 mg total) by mouth daily. Start taking on:  August 10, 2018   gabapentin 100 MG capsule Commonly known as:  NEURONTIN Take 1 capsule (100 mg total) by mouth 3 (three) times daily.   oxyCODONE 5 MG immediate release tablet Commonly known as:  Oxy IR/ROXICODONE Take 1-2 tablets (5-10 mg total) by mouth every 6 (six) hours as needed for up to 7 days for moderate pain, severe pain or breakthrough pain.      Follow-up Information    Seward Meth, MD. Schedule an appointment as soon as possible for a visit in 1 week(s).   Specialty:  Family Medicine Contact information: [address] [place] [address] [phone]           Consultations:  Orthopedic surgery-Dr. Magnus Ivan  Procedures/Studies:  2D echo-none   First and second ray amputation of left foot on 1/19  I&D of the left medial ankle on 1/19 and 1/22  Left leg BKA 1/24 Dg Ankle Complete Left  Result Date: 08/01/2018 CLINICAL DATA:  Left foot wound. EXAM: LEFT ANKLE COMPLETE - 3+ VIEW COMPARISON:  None. FINDINGS: Soft tissue air identified in the medial left ankle and medial left forefoot. There is question osteopenia of the distal aspect of the talus. IMPRESSION: Soft tissue air identified in the medial left ankle medial left forefoot. There is questioned osteopenia in the distal aspect of the talus suspicious for osteomyelitis. Electronically Signed   By:  Sherian Rein M.D.   On: 08/01/2018 19:24   Dg Foot Complete Left  Result Date: 08/01/2018 CLINICAL DATA:  Left foot wound. EXAM: LEFT FOOT - COMPLETE 3+ VIEW COMPARISON:  None. FINDINGS: There is osteopenia of distal aspect of the first metatarsal and proximal aspect of the first proximal phalanx suspicious for osteomyelitis. Surrounding soft tissue air is identified. There is soft tissue air identified in the medial left forefoot. There is question osteopenia of the subjacent distal talus, osteomyelitis is not excluded. IMPRESSION: Findings suspicious for osteomyelitis of the distal aspect of the first metatarsal and proximal aspect of the first proximal phalanx. Question osteopenia in the distal talus subjacent to the soft tissue air, osteomyelitis is not excluded. Electronically Signed   By: Sherian Rein M.D.   On: 08/01/2018 19:22     Subjective: No major events overnight or this morning.  No complaints.  Pain fairly controlled.  Denies dyspnea, wheezing or chest pain.  Reports normal bowel movements.  Discharge Exam: Vitals:   08/09/18 0547 08/09/18 0959  BP: (!) 158/82   Pulse:    Resp:    Temp:    SpO2:  94%   GENERAL: Appears well. No acute distress.  HEENT: MMM.  Vision and Hearing  grossly intact.  NECK: Supple.  No JVD.  LUNGS:  No IWOB. Good air movement. CTAB.  HEART:  RRR. Heart sounds normal.  ABD: Bowel sounds present. Soft. Non tender.  EXT: Left leg BKA.  Ace wrap in place.  No erythema or swelling proximally. SKIN: no apparent skin lesion.  NEURO: Awake, alert and oriented appropriately.  No gross deficit.  PSYCH: Calm. Normal affect.   The results of significant diagnostics from this hospitalization (including imaging, microbiology, ancillary and laboratory) are listed below for reference.     Microbiology: Recent Results (from the past 240 hour(s))  Culture, blood (Routine x 2)     Status: None   Collection Time: 08/01/18  5:51 PM  Result Value Ref Range  Status   Specimen Description   Final    BLOOD LEFT ANTECUBITAL Performed at [facility], [address] [place], [place] [address]    Special Requests   Final    BOTTLES DRAWN AEROBIC AND ANAEROBIC Blood Culture adequate volume Performed at [facility], [address] [place], [place] [address]    Culture   Final    NO GROWTH 5 DAYS Performed at [facility], [address] [place], [place] [address]    Report Status 08/06/2018 FINAL  Final  Culture, blood (Routine x 2)     Status: None   Collection Time: 08/01/18  5:56 PM  Result Value Ref Range Status   Specimen Description   Final    BLOOD BLOOD RIGHT FOREARM Performed at [facility], [address] [place], [place] [address]    Special Requests   Final    BOTTLES DRAWN AEROBIC AND ANAEROBIC Blood Culture adequate volume Performed at [facility], [address] [place], [place] [address]    Culture   Final    NO GROWTH 5 DAYS Performed at [facility], [address] [place], [place] [address]    Report Status 08/06/2018 FINAL  Final  Aerobic/Anaerobic Culture (surgical/deep wound)     Status: None   Collection Time: 08/01/18 10:22 PM  Result Value Ref Range Status   Specimen Description WOUND LEFT FOOT  Final   Special Requests   Final    NONE Performed at [facility], [address] [place], [place] [address]    Gram Stain NO ORGANISMS SEEN MODERATE GRAM POSITIVE COCCI   Final   Culture   Final    MODERATE STAPHYLOCOCCUS AUREUS NO ANAEROBES ISOLATED Performed at [facility], [address] [place], [place] [address]    Report Status 08/07/2018 FINAL  Final   Organism ID, Bacteria STAPHYLOCOCCUS AUREUS  Final      Susceptibility   Staphylococcus aureus - MIC*    CIPROFLOXACIN <=0.5 SENSITIVE Sensitive     ERYTHROMYCIN <=0.25 SENSITIVE Sensitive     GENTAMICIN <=0.5 SENSITIVE Sensitive      OXACILLIN 0.5 SENSITIVE Sensitive     TETRACYCLINE <=1 SENSITIVE Sensitive     VANCOMYCIN <=0.5 SENSITIVE Sensitive     TRIMETH/SULFA <=10 SENSITIVE Sensitive     CLINDAMYCIN <=0.25 SENSITIVE Sensitive     RIFAMPIN <=0.5 SENSITIVE Sensitive     Inducible Clindamycin NEGATIVE Sensitive     * MODERATE STAPHYLOCOCCUS AUREUS     Labs: BNP (last 3 results) No results for input(s): BNP in the last 8760 hours. Basic Metabolic Panel: Recent Labs  Lab 08/03/18 0428 08/04/18 0410 08/05/18 [id number] 08/06/18 0417 08/07/18 [id number]  08/08/18 0342 08/09/18 0342  NA 134* 137 137  --  137 137 134*  K 3.6 3.4* 3.9  --  3.7 3.0* 3.8  CL 101 105 102  --  100 101 98  CO2 24 22 24   --  26 29 27   GLUCOSE 102* 83 100*  --  104* 95 88  BUN 21 17 13   --  13 13 8   CREATININE 0.65 0.66 0.68  --  0.71 0.60* 0.63  CALCIUM 7.9* 7.9* 7.9*  --  8.0* 7.6* 7.8*  MG 2.4 2.4 2.1 2.1  --   --   --   PHOS  --  2.1*  --   --   --   --   --    Liver Function Tests: Recent Labs  Lab 08/03/18 0428 08/04/18 0410  AST 24  --   ALT 23  --   ALKPHOS 62  --   BILITOT 0.9  --   PROT 6.2*  --   ALBUMIN 2.1* 2.1*   No results for input(s): LIPASE, AMYLASE in the last 168 hours. No results for input(s): AMMONIA in the last 168 hours. CBC: Recent Labs  Lab 08/03/18 0428 08/04/18 0410 08/05/18 0511 08/06/18 0417 08/07/18 0342 08/08/18 0342 08/09/18 0342  WBC 36.8* 21.1* 21.0* 22.7* 18.3* 15.3* 14.3*  NEUTROABS 32.3* 17.6* 19.1* 19.1*  --   --   --   HGB 13.2 12.8* 13.0 12.5* 12.2* 11.5* 12.5*  HCT 42.7 40.8 41.8 40.2 39.5 36.7* 39.5  MCV 95.5 95.3 95.0 96.2 95.4 93.1 94.3  PLT 447* 473* 493* 409* 465* 454* 415*   Cardiac Enzymes: No results for input(s): CKTOTAL, CKMB, CKMBINDEX, TROPONINI in the last 168 hours. BNP: Invalid input(s): POCBNP CBG: No results for input(s): GLUCAP in the last 168 hours. D-Dimer No results for input(s): DDIMER in the last 72 hours. Hgb A1c No results for input(s): HGBA1C  in the last 72 hours. Lipid Profile No results for input(s): CHOL, HDL, LDLCALC, TRIG, CHOLHDL, LDLDIRECT in the last 72 hours. Thyroid function studies No results for input(s): TSH, T4TOTAL, T3FREE, THYROIDAB in the last 72 hours.  Invalid input(s): FREET3 Anemia work up No results for input(s): VITAMINB12, FOLATE, FERRITIN, TIBC, IRON, RETICCTPCT in the last 72 hours. Urinalysis No results found for: COLORURINE, APPEARANCEUR, LABSPEC, PHURINE, GLUCOSEU, HGBUR, BILIRUBINUR, KETONESUR, PROTEINUR, UROBILINOGEN, NITRITE, LEUKOCYTESUR Sepsis Labs Invalid input(s): PROCALCITONIN,  WBC,  LACTICIDVEN   Time coordinating discharge: 35 minutes  SIGNED:  Almon Hercules, MD  [facility] 08/09/2018, 10:55 AM Pager [phone]  If 7PM-7AM, please contact night-coverage [email or web address] Password [email or web address]

## 2018-08-09 NOTE — Progress Notes (Signed)
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     Follow Up Recommendations  SNF     Equipment Recommendations  None recommended by PT    Recommendations for Other Services       Precautions / Restrictions Precautions Precautions: Fall Precaution Comments: ALS weakness Restrictions Weight Bearing Restrictions: Yes LLE Weight Bearing: Non weight bearing    Mobility  Bed Mobility Overal bed mobility: Needs Assistance Bed Mobility: Supine to Sit;Sit to Supine     Supine to sit: Supervision;Min guard Sit to supine: Supervision;Min guard   General bed mobility comments: increased time and effort with HOB elevated and use of rails  Transfers Overall transfer level: Needs assistance Equipment used: None Transfers: Lateral/Scoot Transfers          Lateral/Scoot Transfers: Mod assist General transfer comment: assist to complete lateral scooting towrads pt Right assised from bed to wheelchair   Ambulation/Gait             General Gait Details: non amb at this time so used a wc for mobility    Psychologist, counsellingtairs             Wheelchair Mobility Wheelchair Mobility Wheelchair mobility: Yes Wheelchair propulsion: Both upper extremities;Right lower extremity Wheelchair parts: Supervision/cueing Distance: pt propelling wc at Supervision level > 300 feet  Modified Rankin (Stroke Patients Only)       Balance                                            Cognition Arousal/Alertness:  Awake/alert Behavior During Therapy: WFL for tasks assessed/performed Overall Cognitive Status: Within Functional Limits for tasks assessed                                        Exercises      General Comments        Pertinent Vitals/Pain Pain Assessment: 0-10 Pain Score: 2  Pain Location: LLE Pain Descriptors / Indicators: Discomfort;Sore Pain Intervention(s): Monitored during session;Repositioned    Home Living                      Prior Function            PT Goals (current goals can now be found in the care plan section) Progress towards PT goals: Progressing toward goals    Frequency    Min 3X/week      PT Plan Current plan remains appropriate    Co-evaluation              AM-PAC PT "6 Clicks" Mobility   Outcome Measure                   End of Session Equipment Utilized During Treatment: Gait belt Activity Tolerance: Patient tolerated treatment well  Patient left: with call bell/phone within reach;in chair;with family/visitor present Nurse Communication: Mobility status PT Visit Diagnosis: Muscle weakness (generalized) (M62.81);Other abnormalities of gait and mobility (R26.89)     Time: 1135-1200 PT Time Calculation (min) (ACUTE ONLY): 25 min  Charges:  $Gait Training: 8-22 mins                     Felecia Shelling  PTA Acute  Rehabilitation Services Pager      [phone] Office      [phone]

## 2018-08-09 NOTE — [place] FL2 (Signed)
[place] MEDICAID FL2 LEVEL OF CARE SCREENING TOOL     IDENTIFICATION  Patient Name: Bruce Deleon Birthdate: 1956-01-21 Sex: male Admission Date (Current Location): 08/01/2018  [facility] [place]   Facility and Address:  [facility],  [address], [place] [address]      Provider Number: [id number]  Attending Physician Name and Address:  Almon HerculesGonfa, Taye T, MD  Relative Name and Phone Number:       Current Level of Care: Hospital Recommended Level of Care: Skilled Nursing Facility Prior Approval Number:    Date Approved/Denied:   PASRR Number: [id number]  Discharge Plan: SNF    Current Diagnoses: Patient Active Problem List   Diagnosis Date Noted  . Osteomyelitis (HCC) 08/01/2018  . Left foot and ankle infection 08/01/2018  . Cellulitis of left foot   . Chronic respiratory insufficiency 06/12/2014  . 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 Essential hypertension 01/25/2013    Orientation RESPIRATION BLADDER Height & Weight     Self, Time, Situation, Place  Normal Continent Weight: 148 lb 13 oz (67.5 kg) Height:  6' (182.9 cm)  BEHAVIORAL SYMPTOMS/MOOD NEUROLOGICAL BOWEL NUTRITION STATUS      Continent Diet(Regular )  AMBULATORY STATUS COMMUNICATION OF NEEDS Skin   Extensive Assist Verbally (Left below-knee amputation.)                       Personal Care Assistance Level of Assistance  Bathing, Feeding, Dressing Bathing Assistance: Maximum assistance Feeding assistance: Independent Dressing Assistance: Maximum assistance     Functional Limitations Info  Sight, Hearing, Speech Sight Info: Adequate Hearing Info: Adequate Speech Info: Adequate    SPECIAL CARE FACTORS FREQUENCY  PT (By licensed PT), OT (By licensed OT)     PT Frequency: 5x/week OT Frequency: 5x/week            Contractures Contractures Info: Not present    Additional Factors Info  Code Status, Allergies, Psychotropic,  Insulin Sliding Scale Code Status Info: Fullcode  Allergies Info: Allergies: No Known Allergies           Current Medications (08/09/2018):  This is the current hospital active medication list Current Facility-Administered Medications  Medication Dose Route Frequency Provider Last Rate Last Dose  . 0.9 %  sodium chloride infusion   Intravenous Continuous Kathryne HitchBlackman, Christopher Y, MD   Stopped at 08/07/18 2000  . alum & mag hydroxide-simeth (MAALOX/MYLANTA) 200-200-20 MG/5ML suspension 30 mL  30 mL Oral Q4H PRN Kathryne HitchBlackman, Christopher Y, MD   30 mL at 08/06/18 1008  . amLODipine (NORVASC) tablet 5 mg  5 mg Oral Daily Kathryne HitchBlackman, Christopher Y, MD   5 mg at 08/09/18 0840  . budesonide (PULMICORT) nebulizer solution 0.25 mg  0.25 mg Nebulization BID Kathryne HitchBlackman, Christopher Y, MD   0.25 mg at 08/09/18 1006  . gabapentin (NEURONTIN) capsule 100 mg  100 mg Oral TID Kathryne HitchBlackman, Christopher Y, MD   100 mg at 08/09/18 0840  . heparin injection 5,000 Units  5,000 Units Subcutaneous Q8H Kathryne HitchBlackman, Christopher Y, MD   5,000 Units at 08/09/18 [id number]  . hydrALAZINE (APRESOLINE) injection 10 mg  10 mg Intravenous Q4H PRN Kathryne HitchBlackman, Christopher Y, MD   10 mg at 08/06/18 [id number]  . HYDROmorphone (DILAUDID) injection 1 mg  1 mg Intravenous Q2H PRN Kathryne HitchBlackman, Christopher Y, MD   1 mg at 08/09/18 0841  . ipratropium-albuterol (DUONEB) 0.5-2.5 (3) MG/3ML nebulizer solution 3 mL  3 mL Nebulization Q6H PRN  Kathryne HitchBlackman, Christopher Y, MD      . ipratropium-albuterol (DUONEB) 0.5-2.5 (3) MG/3ML nebulizer solution 3 mL  3 mL Nebulization BID Candelaria StagersGonfa, Taye T, MD   3 mL at 08/09/18 0959  . lactated ringers infusion   Intravenous Continuous Kathryne HitchBlackman, Christopher Y, MD   Stopped at 08/07/18 1220  . ondansetron (ZOFRAN) injection 4 mg  4 mg Intravenous Q6H PRN Kathryne HitchBlackman, Christopher Y, MD   4 mg at 08/04/18 1452  . oxyCODONE (Oxy IR/ROXICODONE) immediate release tablet 5-10 mg  5-10 mg Oral Q3H PRN Kathryne HitchBlackman, Christopher Y, MD   10 mg at 08/09/18  [id number]     Discharge Medications: Please see discharge summary for a list of discharge medications.  Relevant Imaging Results:  Relevant Lab Results:   Additional Information [id number]  Clearance CootsNicole A Kinnley Paulson, LCSW

## 2018-08-09 NOTE — Progress Notes (Signed)
Patient ID: Bruce Deleon, male   DOB: 24-Nov-1955, 63 y.o.   MRN: [id number] No acute changes.  Recovering well from his BKA.  WBC continues to come down.  Vitals stable.  Left stump dressing clean and dry. At this point, will need rehab vs short-term skilled nursing vs home pending therapy's recommendations.

## 2018-08-10 LAB — CBC
HCT: 40.9 % (ref 39.0–52.0)
Hemoglobin: 12.9 g/dL — ABNORMAL LOW (ref 13.0–17.0)
MCH: 29.7 pg (ref 26.0–34.0)
MCHC: 31.5 g/dL (ref 30.0–36.0)
MCV: 94 fL (ref 80.0–100.0)
NRBC: 0 % (ref 0.0–0.2)
Platelets: 381 10*3/uL (ref 150–400)
RBC: 4.35 MIL/uL (ref 4.22–5.81)
RDW: 13.5 % (ref 11.5–15.5)
WBC: 16.7 10*3/uL — ABNORMAL HIGH (ref 4.0–10.5)

## 2018-08-10 LAB — BASIC METABOLIC PANEL
ANION GAP: 10 (ref 5–15)
BUN: 7 mg/dL — ABNORMAL LOW (ref 8–23)
CO2: 24 mmol/L (ref 22–32)
Calcium: 7.9 mg/dL — ABNORMAL LOW (ref 8.9–10.3)
Chloride: 96 mmol/L — ABNORMAL LOW (ref 98–111)
Creatinine, Ser: 0.62 mg/dL (ref 0.61–1.24)
GFR calc non Af Amer: >60 mL/min (ref >60)
Glucose, Bld: 88 mg/dL (ref 70–99)
Potassium: 3.5 mmol/L (ref 3.5–5.1)
Sodium: 130 mmol/L — ABNORMAL LOW (ref 135–145)

## 2018-08-10 NOTE — Progress Notes (Signed)
Report called to [facility]. Transport called. IV removed and med rec placed in d/c packet.  Pt stable at time of d/c.

## 2018-08-10 NOTE — Discharge Instructions (Signed)
Keep your left leg dressing clean and dry. 

## 2018-08-10 NOTE — Clinical Social Work Placement (Addendum)
Insurance Authorization received at [facility] Spouse to complete admission paperwork at 10:00 AM,  [facility] to transport.  Updated D/C Summary sent.   CLINICAL SOCIAL WORK PLACEMENT  NOTE  Date:  08/10/2018  Patient Details  Name: Bruce Deleon MRN: [id number] Date of Birth: [birth year]  Clinical Social Work is seeking post-discharge placement for this patient at the Skilled  Nursing Facility level of care (*CSW will initial, date and re-position this form in  chart as items are completed):  Yes   Patient/family provided with [place] Clinical Social Work Department's list of facilities offering this level of care within the geographic area requested by the patient (or if unable, by the patient's family).  Yes   Patient/family informed of their freedom to choose among providers that offer the needed level of care, that participate in Medicare, Medicaid or managed care program needed by the patient, have an available bed and are willing to accept the patient.  Yes   Patient/family informed of [facility] ownership interest in [facility] and [facility], as well as of the fact that they are under no obligation to receive care at these facilities.  PASRR submitted to EDS on       PASRR number received on       Existing PASRR number confirmed on 08/10/18     FL2 transmitted to all facilities in geographic area requested by pt/family on       FL2 transmitted to all facilities within larger geographic area on 08/10/18     Patient informed that his/her managed care company has contracts with or will negotiate with certain facilities, including the following:  [facility]     Yes   Patient/family informed of bed offers received.  Patient chooses bed at [facility]     Physician recommends and patient chooses bed at      Patient to be transferred to [facility] on 08/10/18.  Patient to be transferred to facility by [facility]      Patient family notified on 08/10/18 of transfer.  Name of family member notified:  Spouse Renee     PHYSICIAN       Additional Comment:    _______________________________________________ Clearance Coots, LCSW 08/10/2018, 8:35 AM

## 2018-08-10 NOTE — Discharge Summary (Signed)
Physician Discharge Summary  Bruce RicksMark Deleon UJW:[id number] DOB: 01/12/1956 DOA: 08/01/2018  PCP: Seward MethHess, Terry D, MD  Admit date: 08/01/2018 Discharge date: 08/10/2018  Admitted From: Home Disposition: SNF  Recommendations for Outpatient Follow-up:  1. Follow up with PCP and orthopedic surgery in 1 to 2 weeks 2. Please obtain BMP/CBC in one week 3. Please follow up on the following pending results: None  Equipment/Devices: 3 in 1 bedside commode, wheelchair and wheelchair cushion  Discharge Condition: Stable CODE STATUS: Full code Diet recommendation: Low-salt  HPI: Per Dr. Willaim BanePark "Bruce RicksMark Teutsch is a 63 y.o. male with ALS (mainly R sided paraplegia) and hx of HTN who presents from home with acute swelling and pain over L medial ankle. Patient and wife reports that he has had chronic wounds for several weeks over his left lateral 1st toe MTP and L 2nd toe, but new wound on L medial ankle was noted over the past week. Patient thinks that these wounds initially started as pressure ulcers. Severe throbbing pain over the L medial ankle, especially with palpation. Erythema extending into upper foot over the past few days. Per wife, no other pressure ulcers that she has noticed. She had also mentioned that patient is reticent to see medical providers. Denies fevers or chills. Has not noticed any drainage over the other L foot wounds. Patient reports that he does not use gauze or dressing/wound care for the wounds on his L foot other than changing his socks everyday. Wife also reported that they had acquired a wheelchair recently, but that he has not been able to use it because their house is not wheelchair accessible and has no ramp. Regarding ALS, patient reports that he has been able to ambulate with a walker until the past few weeks when he has been limited due to his L foot pain. Main symptoms are R sided paraplegia and R upper extremity weakness. Denies shortness of breath".   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 Started on vancomycin, Zosyn and clindamycin on 1/19. Orthopedic surgery consulted. Clindamycin was stopped 1/20. Vancomycin stopped on 1/22-wound culture grew MSSA. First and second ray amputation of left foot and extensive I&D of left medial ankle and foot on 1/19. Repeat I&D on 1/22. Continued to have significant necrotic tissues and leukocytosis despite the above interventions. 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 Magnus IvanBlackman and remained stable.  Evaluated by physical and Occupational Therapy who recommended SNF, 3 in 1 bedside commode, wheelchair and wheelchair cushion.   See individual problem list below for more. 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  Doing well. Discharge to SNF. 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  Stopped Zosyn after discussion with Dr. August Saucerean [facility]) -Scheduled Tylenol, gabapentin and PRN oxycodone for pain control. -PT/OT recommended SNF with 3 in 1 bedside commode and wheelchair.  Leukocytosis: Improved -Recommend repeat CBC in 1 week  Mild hypokalemia: Resolved. -Recommend repeat BMP at follow-up.  Hyponatremia: Euvolemic. ?SIADH -Repeat BMP in 1 week -Liberate diet to regular  Hypertension: SBP in the  range of 130-150. -Discharged on low-dose amlodipine. -Adjust medications as appropriate to follow.  ALS: physical therapy and wheelchair as above  Wheezing and dyspnea: Improved with breathing  treatment. -Discharged on albuterol inhaler as needed  Discharge Instructions  Discharge Instructions    Diet - low sodium heart healthy   Complete by:  As directed    Increase activity slowly   Complete by:  As directed      Allergies as of 08/10/2018   No Known Allergies     Medication List    STOP taking these medications   naproxen sodium 220 MG tablet Commonly known as:  ALEVE     TAKE these medications   acetaminophen 325 MG tablet Commonly known as:  TYLENOL Take 2 tablets (650 mg total) by mouth every 6 (six) hours for 15 days.   albuterol 108 (90 Base) MCG/ACT inhaler Commonly known as:  PROVENTIL HFA;VENTOLIN HFA Inhale 2 puffs into the lungs every 6 (six) hours as needed for wheezing or shortness of breath.   amLODipine 5 MG tablet Commonly known as:  NORVASC Take 1 tablet (5 mg total) by mouth daily.   gabapentin 100 MG capsule Commonly known as:  NEURONTIN Take 1 capsule (100 mg total) by mouth 3 (three) times daily.   oxyCODONE 5 MG immediate release tablet Commonly known as:  Oxy IR/ROXICODONE Take 1-2 tablets (5-10 mg total) by mouth every 6 (six) hours as needed for up to 7 days for moderate pain, severe pain or breakthrough pain.       Contact information for follow-up providers    Seward MethHess, Terry D, MD. Schedule an appointment as soon as possible for a visit in 1 week(s).   Specialty:  Family Medicine Contact information: [address] [place] [address] [phone]            Contact information for after-discharge care    Dest[facility] .   Service:  Skilled Nursing Contact information: [address] [place] [phone]                  Consultations:  Orthopedic surgery-Dr. Magnus IvanBlackman  Procedures/Studies:  2D echo-none   First and second ray amputation of left foot on 1/19  I&D of the left medial ankle on 1/19 and  1/22  Left leg BKA 1/24 Dg Ankle Complete Left  Result Date: 08/01/2018 CLINICAL DATA:  Left foot wound. EXAM: LEFT ANKLE COMPLETE - 3+ VIEW COMPARISON:  None. FINDINGS: Soft tissue air identified in the medial left ankle and medial left forefoot. There is question osteopenia of the distal aspect of the talus. IMPRESSION: Soft tissue air identified in the medial left ankle medial left forefoot. There is questioned osteopenia in the distal aspect of the talus suspicious for osteomyelitis. Electronically Signed   By: Sherian ReinWei-Chen  Lin M.D.   On: 08/01/2018 19:24   Dg Foot Complete Left  Result Date: 08/01/2018 CLINICAL DATA:  Left foot wound. EXAM: LEFT FOOT - COMPLETE 3+ VIEW COMPARISON:  None. FINDINGS: There is osteopenia of distal aspect of the first metatarsal and proximal aspect of the first proximal phalanx suspicious for osteomyelitis. Surrounding soft tissue air is identified. There is soft tissue air identified in the medial left forefoot. There is question osteopenia of the subjacent distal talus, osteomyelitis is not excluded. IMPRESSION: Findings suspicious for osteomyelitis of the distal aspect of the first metatarsal and proximal aspect of the first proximal phalanx. Question osteopenia in  the distal talus subjacent to the soft tissue air, osteomyelitis is not excluded. Electronically Signed   By: Sherian Rein M.D.   On: 08/01/2018 19:22     Subjective: No major events overnight or this morning.  No complaints.  Pain fairly controlled.  Denies dyspnea, wheezing or chest pain.  Reports normal bowel movements.  Discharge Exam: Vitals:   08/10/18 0456 08/10/18 0808  BP: (!) 157/78   Pulse: 81   Resp: 17   Temp: 98.3 F (36.8 C)   SpO2: 95% 94%   GENERAL: Appears well. No acute distress.  HEENT: MMM.  Vision and Hearing grossly intact.  NECK: Supple.  No JVD.  LUNGS:  No IWOB. Good air movement. CTAB.  HEART:  RRR. Heart sounds normal.  ABD: Bowel sounds present. Soft. Non  tender.  EXT: Left leg BKA.  Ace wrap in place.  No erythema or swelling proximally. SKIN: no apparent skin lesion.  NEURO: Awake, alert and oriented appropriately.  No gross deficit.  PSYCH: Calm. Normal affect.   The results of significant diagnostics from this hospitalization (including imaging, microbiology, ancillary and laboratory) are listed below for reference.     Microbiology: Recent Results (from the past 240 hour(s))  Culture, blood (Routine x 2)     Status: None   Collection Time: 08/01/18  5:51 PM  Result Value Ref Range Status   Specimen Description   Final    BLOOD LEFT ANTECUBITAL Performed at [facility], [address] [place], [place] [address]    Special Requests   Final    BOTTLES DRAWN AEROBIC AND ANAEROBIC Blood Culture adequate volume Performed at [facility], [address] [place], [place] [address]    Culture   Final    NO GROWTH 5 DAYS Performed at [facility], [address], [place], [place] [address]    Report Status 08/06/2018 FINAL  Final  Culture, blood (Routine x 2)     Status: None   Collection Time: 08/01/18  5:56 PM  Result Value Ref Range Status   Specimen Description   Final    BLOOD BLOOD RIGHT FOREARM Performed at [facility], [address] [place], [place] [address]    Special Requests   Final    BOTTLES DRAWN AEROBIC AND ANAEROBIC Blood Culture adequate volume Performed at [facility], [address], [place], [place] [address]    Culture   Final    NO GROWTH 5 DAYS Performed at [facility], [address], [place], [place] [address]    Report Status 08/06/2018 FINAL  Final  Aerobic/Anaerobic Culture (surgical/deep wound)     Status: None   Collection Time: 08/01/18 10:22 PM  Result Value Ref Range Status   Specimen Description WOUND LEFT FOOT  Final   Special Requests   Final    NONE Performed at [facility], [address] [place], [place] [address]    Gram Stain NO ORGANISMS SEEN MODERATE GRAM POSITIVE COCCI   Final   Culture   Final    MODERATE STAPHYLOCOCCUS AUREUS NO ANAEROBES ISOLATED Performed at [facility], [address], [place], [place] [address]    Report Status 08/07/2018 FINAL  Final   Organism ID, Bacteria STAPHYLOCOCCUS AUREUS  Final      Susceptibility   Staphylococcus aureus - MIC*    CIPROFLOXACIN <=0.5 SENSITIVE Sensitive     ERYTHROMYCIN <=0.25 SENSITIVE Sensitive  GENTAMICIN <=0.5 SENSITIVE Sensitive     OXACILLIN 0.5 SENSITIVE Sensitive     TETRACYCLINE <=1 SENSITIVE Sensitive     VANCOMYCIN <=0.5 SENSITIVE Sensitive     TRIMETH/SULFA <=10 SENSITIVE Sensitive     CLINDAMYCIN <=0.25 SENSITIVE Sensitive     RIFAMPIN <=0.5 SENSITIVE Sensitive     Inducible Clindamycin NEGATIVE Sensitive     * MODERATE STAPHYLOCOCCUS AUREUS     Labs: BNP (last 3 results) No results for input(s): BNP in the last 8760 hours. Basic Metabolic Panel: Recent Labs  Lab 08/04/18 0410 08/05/18 0511 08/06/18 0417 08/07/18 0342 08/08/18 0342 08/09/18 0342 08/10/18 0355  NA 137 137  --  137 137 134* 130*  K 3.4* 3.9  --  3.7 3.0* 3.8 3.5  CL 105 102  --  100 101 98 96*  CO2 22 24  --  26 29 27 24   GLUCOSE 83 100*  --  104* 95 88 88  BUN 17 13  --  13 13 8  7*  CREATININE 0.66 0.68  --  0.71 0.60* 0.63 0.62  CALCIUM 7.9* 7.9*  --  8.0* 7.6* 7.8* 7.9*  MG 2.4 2.1 2.1  --   --   --   --   PHOS 2.1*  --   --   --   --   --   --    Liver Function Tests: Recent Labs  Lab 08/04/18 0410  ALBUMIN 2.1*   No results for input(s): LIPASE, AMYLASE in the last 168 hours. No results for input(s): AMMONIA in the last 168 hours. CBC: Recent Labs  Lab 08/04/18 0410 08/05/18 0511 08/06/18 0417 08/07/18 0342 08/08/18 0342 08/09/18 0342 08/10/18 0355  WBC 21.1* 21.0* 22.7* 18.3* 15.3* 14.3* 16.7*  NEUTROABS 17.6* 19.1* 19.1*  --   --   --   --   HGB 12.8* 13.0  12.5* 12.2* 11.5* 12.5* 12.9*  HCT 40.8 41.8 40.2 39.5 36.7* 39.5 40.9  MCV 95.3 95.0 96.2 95.4 93.1 94.3 94.0  PLT 473* 493* 409* 465* 454* 415* 381   Cardiac Enzymes: No results for input(s): CKTOTAL, CKMB, CKMBINDEX, TROPONINI in the last 168 hours. BNP: Invalid input(s): POCBNP CBG: No results for input(s): GLUCAP in the last 168 hours. D-Dimer No results for input(s): DDIMER in the last 72 hours. Hgb A1c No results for input(s): HGBA1C in the last 72 hours. Lipid Profile No results for input(s): CHOL, HDL, LDLCALC, TRIG, CHOLHDL, LDLDIRECT in the last 72 hours. Thyroid function studies No results for input(s): TSH, T4TOTAL, T3FREE, THYROIDAB in the last 72 hours.  Invalid input(s): FREET3 Anemia work up No results for input(s): VITAMINB12, FOLATE, FERRITIN, TIBC, IRON, RETICCTPCT in the last 72 hours. Urinalysis No results found for: COLORURINE, APPEARANCEUR, LABSPEC, PHURINE, GLUCOSEU, HGBUR, BILIRUBINUR, KETONESUR, PROTEINUR, UROBILINOGEN, NITRITE, LEUKOCYTESUR Sepsis Labs Invalid input(s): PROCALCITONIN,  WBC,  LACTICIDVEN   Time coordinating discharge: 35 minutes  SIGNED:  Almon Hercules, MD  [facility] 08/10/2018, 8:41 AM Pager [phone]  If 7PM-7AM, please contact night-coverage [email or web address] Password [email or web address]

## 2018-08-10 NOTE — Plan of Care (Signed)
Pt alert and oriented, resting with family at the bedside.  Plan to d/c to SNF today per MD order.  RN will monitor.

## 2018-08-26 ENCOUNTER — Encounter (INDEPENDENT_AMBULATORY_CARE_PROVIDER_SITE_OTHER): Payer: Self-pay | Admitting: Orthopaedic Surgery

## 2018-08-26 ENCOUNTER — Ambulatory Visit (INDEPENDENT_AMBULATORY_CARE_PROVIDER_SITE_OTHER): Payer: Medicare Other | Admitting: Orthopaedic Surgery

## 2018-08-26 DIAGNOSIS — Z89512 Acquired absence of left leg below knee: Secondary | ICD-10-CM

## 2018-08-26 NOTE — Progress Notes (Signed)
The patient is 20 days status post a left below-knee amputation secondary to an overwhelming foot and ankle infection with necrotizing fasciitis.  He is convalescing in a nursing facility.  He only reports mild pain and says he is been doing well.  On exam remove the sutures in place Steri-Strips.  There is a slight red hue but no drainage.  He did let me know the facility put him on antibiotic starting yesterday.  This point I do feel he can start the stump training process.  I gave instructions the nursing facility to contact [employer] and gave a prescription for that to see if they can start the process.  I will have him stay on doxycycline 100 mg twice a day and would like to see him back in 2 weeks to make sure that he is making good progress.  All question concerns were answered and addressed.

## 2018-09-06 ENCOUNTER — Ambulatory Visit (INDEPENDENT_AMBULATORY_CARE_PROVIDER_SITE_OTHER): Payer: Medicare Other | Admitting: Physician Assistant

## 2018-09-07 ENCOUNTER — Telehealth (INDEPENDENT_AMBULATORY_CARE_PROVIDER_SITE_OTHER): Payer: Self-pay

## 2018-09-07 ENCOUNTER — Other Ambulatory Visit: Payer: Self-pay

## 2018-09-07 NOTE — Telephone Encounter (Signed)
Deana with [facility] wanted to let you know that patient's left stump has some redness, a little drainage, but no odor and no warmth.  Patient is still taking antibiotic.  Patient has an appointment Wednesday, 09/08/2018.  Cb# is [phone].  Please advise.  Thank You.

## 2018-09-08 ENCOUNTER — Ambulatory Visit (INDEPENDENT_AMBULATORY_CARE_PROVIDER_SITE_OTHER): Payer: Medicare Other | Admitting: Physician Assistant

## 2018-09-08 ENCOUNTER — Encounter (INDEPENDENT_AMBULATORY_CARE_PROVIDER_SITE_OTHER): Payer: Self-pay | Admitting: Physician Assistant

## 2018-09-08 DIAGNOSIS — Z89512 Acquired absence of left leg below knee: Secondary | ICD-10-CM

## 2018-09-08 MED ORDER — DOXYCYCLINE HYCLATE 100 MG PO TABS: 100.0000 mg | ORAL_TABLET | Freq: Two times a day (BID) | ORAL | 0 refills | Status: AC

## 2018-09-08 NOTE — Progress Notes (Signed)
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  He states he is overall doing pretty well.  Taking gabapentin and oxycodone.  Said no bleeding.  He actually did go to [facility] and received his first shrinker.  He is on doxycycline 100 mg twice daily without any adverse effects.  Physical exam: Left BKA large area of eschar mid incision.  Fibrinous tissue about the incision medially and laterally.  No drainage or expressible purulence.  Mild erythema about the incision.  Good range of motion left knee.  Impression: Status post left BKA 38 days postop  Plan: He will go into the next [employer].  He will wash the wound daily with an antibacterial soap and dry completely he is not to submerge the leg.  However he can get it wet in the shower.  Discussed with him dressing changes which should be dry to dry dressing changes.  See him back in 10 days sooner if there is any questions or concerns.  Questions were encouraged and answered both the patient and his wife is present throughout the exam.  He will continue the doxycycline 100 mg twice daily.

## 2018-09-09 ENCOUNTER — Ambulatory Visit (INDEPENDENT_AMBULATORY_CARE_PROVIDER_SITE_OTHER): Payer: Medicare Other | Admitting: Physician Assistant

## 2018-09-10 ENCOUNTER — Encounter (INDEPENDENT_AMBULATORY_CARE_PROVIDER_SITE_OTHER): Payer: Self-pay

## 2018-09-14 ENCOUNTER — Telehealth (INDEPENDENT_AMBULATORY_CARE_PROVIDER_SITE_OTHER): Payer: Self-pay | Admitting: Orthopaedic Surgery

## 2018-09-14 NOTE — Telephone Encounter (Signed)
Dr. Blackman patient 

## 2018-09-14 NOTE — Telephone Encounter (Signed)
Deana from [facility] called to clarify if the patient is suppose to wash his stump with antibiotic soap and have his wife change the dressings.  If so, then they will d/c the patient from home health.  CB#[phone].  Thank you.

## 2018-09-14 NOTE — Telephone Encounter (Signed)
Verbal given for wound care orders

## 2018-09-21 ENCOUNTER — Ambulatory Visit (INDEPENDENT_AMBULATORY_CARE_PROVIDER_SITE_OTHER): Payer: Medicare Other | Admitting: Orthopaedic Surgery

## 2018-09-22 ENCOUNTER — Encounter (INDEPENDENT_AMBULATORY_CARE_PROVIDER_SITE_OTHER): Payer: Self-pay | Admitting: Orthopaedic Surgery

## 2018-09-22 ENCOUNTER — Ambulatory Visit (INDEPENDENT_AMBULATORY_CARE_PROVIDER_SITE_OTHER): Payer: Medicare Other | Admitting: Orthopaedic Surgery

## 2018-09-22 ENCOUNTER — Other Ambulatory Visit: Payer: Self-pay

## 2018-09-22 DIAGNOSIS — Z89512 Acquired absence of left leg below knee: Secondary | ICD-10-CM

## 2018-09-22 NOTE — Progress Notes (Signed)
The patient is continue to follow-up status post a left below-knee amputation.  He is 47 days out from the surgery.  He has had some breakdown of the stump.  He is not a diabetic.  This BKA was performed secondary to infection.  On examination the stump itself looks like it is healing.  There is some eschar that I did unroofed with a #10 blade scalpel.  I got some good bleeding tissue as well.  I will have him place Bactroban ointment on this at least every other day and may be isosorbide.  Every third day we will just do a wet-to-dry dressing.  Still uses compression socks.  We will see him back in 2 weeks to see how his leg is doing.  He does have an appointment with the prosthetics person next week.

## 2018-09-23 ENCOUNTER — Ambulatory Visit: Payer: Medicare Other | Admitting: Internal Medicine

## 2018-09-24 ENCOUNTER — Other Ambulatory Visit (INDEPENDENT_AMBULATORY_CARE_PROVIDER_SITE_OTHER): Payer: Self-pay | Admitting: Orthopaedic Surgery

## 2018-09-24 ENCOUNTER — Telehealth (INDEPENDENT_AMBULATORY_CARE_PROVIDER_SITE_OTHER): Payer: Self-pay | Admitting: Orthopaedic Surgery

## 2018-09-24 MED ORDER — OXYCODONE HCL 5 MG PO TABS: 5.0000 mg | ORAL_TABLET | Freq: Four times a day (QID) | ORAL | 0 refills | Status: DC | PRN

## 2018-09-24 NOTE — Telephone Encounter (Signed)
Please advise 

## 2018-09-24 NOTE — Telephone Encounter (Signed)
Patient's wife left a voicemail requesting an RX on his OxyContin or whatever pain medication Dr. Magnus Ivan would like for him to take.  CB#[phone].  Thank you.

## 2018-09-29 ENCOUNTER — Other Ambulatory Visit: Payer: Self-pay

## 2018-09-29 ENCOUNTER — Ambulatory Visit (INDEPENDENT_AMBULATORY_CARE_PROVIDER_SITE_OTHER): Payer: Medicare Other | Admitting: Internal Medicine

## 2018-09-29 ENCOUNTER — Encounter: Payer: Self-pay | Admitting: Internal Medicine

## 2018-09-29 VITALS — BP 110/70 | HR 86 | Temp 98.2°F | Ht 72.0 in

## 2018-09-29 DIAGNOSIS — I10 Essential (primary) hypertension: Secondary | ICD-10-CM

## 2018-09-29 DIAGNOSIS — Z89512 Acquired absence of left leg below knee: Secondary | ICD-10-CM | POA: Diagnosis not present

## 2018-09-29 DIAGNOSIS — G1221 Amyotrophic lateral sclerosis: Secondary | ICD-10-CM

## 2018-09-29 MED ORDER — AMLODIPINE BESYLATE 5 MG PO TABS: 5.0000 mg | ORAL_TABLET | Freq: Every day | ORAL | 0 refills | Status: DC

## 2018-09-29 NOTE — Progress Notes (Signed)
New Patient Office Visit     CC/Reason for Visit: establish care Previous PCP: Vickki Hearing, MD Last Visit:   HPI: Bruce Deleon is a 63 y.o. male who is coming in today for the above mentioned reasons.  Patient is here today with his wife. Past Medical History is significant for ALS and hypertension.  Patient was dx with ALS in 2014 and attends [facility] clinic.  He also sees a provider at [facility].  Patient c/o weakness, more so on his right side, but denies any problems swallowing.  January of this year he had left below the knee amputation by Dr. Deboraha Sprang at [facility].  He complains of phantom pain and takes gabapentin and oxycodone.  He is finishing up an antibiotic for his incision on his left stump.  Patient has hypertension and is requesting a refill on his medication today.     Past Medical/Surgical History: Past Medical History:  Diagnosis Date  . 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 ALS (amyotrophic lateral sclerosis) (HCC)   . Chronic respiratory insufficiency 06/12/2014  . Essential hypertension 01/25/2013    Past Surgical History:  Procedure Laterality Date  . AMPUTATION Left 08/06/2018   Procedure: LEFT BELOW KNEE AMPUTATION;  Surgeon: Kathryne Hitch, MD;  Location: [facility];  Service: Orthopedics;  Laterality: Left;  . APPLICATION OF WOUND VAC Left 08/04/2018   Procedure: APPLICATION OF WOUND VAC;  Surgeon: Kathryne Hitch, MD;  Location: [facility];  Service: Orthopedics;  Laterality: Left;  . I&D EXTREMITY Left 08/01/2018   Procedure: IRRIGATION AND DEBRIDEMENT foot and ankle amputation first and second rays;  Surgeon: Kathryne Hitch, MD;  Location: [facility];  Service: Orthopedics;  Laterality: Left;  . I&D EXTREMITY Left 08/04/2018   Procedure: REPEAT IRRIGATION AND DEBRIDEMENT OF LEFT ANKLE;  Surgeon: Kathryne Hitch, MD;  Location: [facility];  Service: Orthopedics;  Laterality: Left;    Social History:   reports that he has quit smoking. He has never used smokeless tobacco. No history on file for alcohol and drug.  Allergies: No Known Allergies  Family History:  No family history on file. No h/o HTN, CAD, cancer that he is aware of   Current Outpatient Medications:  .  albuterol (PROVENTIL HFA;VENTOLIN HFA) 108 (90 Base) MCG/ACT inhaler, Inhale 2 puffs into the lungs every 6 (six) hours as needed for wheezing or shortness of breath., Disp: 1 Inhaler, Rfl: 2 .  amLODipine (NORVASC) 5 MG tablet, Take 1 tablet (5 mg total) by mouth daily., Disp: 30 tablet, Rfl: 0 .  doxycycline (VIBRA-TABS) 100 MG tablet, , Disp: , Rfl:  .  gabapentin (NEURONTIN) 100 MG capsule, Take 1 capsule (100 mg total) by mouth 3 (three) times daily., Disp: 90 capsule, Rfl: 0 .  oxyCODONE (ROXICODONE) 5 MG immediate release tablet, Take 1 tablet (5 mg total) by mouth every 6 (six) hours as needed for severe pain., Disp: 40 tablet, Rfl: 0  Review of Systems: Constitutional: Denies fever, chills, diaphoresis and fatigue. C/o decreased appetite & weight loss HEENT: Denies photophobia, eye pain, redness, hearing loss, ear pain, congestion, sore throat, rhinorrhea, sneezing, mouth sores, neck pain, neck stiffness and tinnitus.  C/o throat "thickness" Respiratory: Denies SOB, DOE, cough, chest tightness,  and wheezing.   Cardiovascular: Denies chest pain, palpitations and leg swelling.  Gastrointestinal: Denies nausea, vomiting, abdominal pain, diarrhea, constipation, blood in stool and abdominal distention.  Genitourinary: Denies dysuria, urgency, frequency, hematuria, flank pain and difficulty urinating.  Endocrine: Denies: hot or cold intolerance, sweats, changes in hair or nails, polyuria, polydipsia. Musculoskeletal: Denies myalgias, back pain, joint swelling, arthralgias.  C/o falls every couple of months  Skin: Denies pallor, rash and wound.  Neurological: Denies dizziness, seizures, syncope, light-headedness,  numbness and headaches. C/o weakness bilaterally Hematological: Denies adenopathy. Easy bruising, personal or family bleeding history  Psychiatric/Behavioral: Denies suicidal ideation, mood changes, confusion, nervousness, sleep disturbance and agitation   Physical Exam: Vitals:   09/29/18 1453  BP: 110/70  Pulse: 86  Temp: 98.2 F (36.8 C)  TempSrc: Oral  SpO2: 96%  Height: 6' (1.829 m)   Body mass index is 20.18 kg/m.   Constitutional: NAD, calm, comfortable Eyes: PERRL, lids and conjunctivae normal Respiratory: clear to auscultation bilaterally, no wheezing, no crackles. Normal respiratory effort. No accessory muscle use.  Cardiovascular: Regular rate and rhythm, no murmurs / rubs / gallops. No extremity edema. 2+ pedal pulses. No carotid bruits.   Psychiatric: Normal judgment and insight. Alert and oriented x 3. Normal mood.    Impression and Plan:  ALS (amyotrophic lateral sclerosis) (HCC) -dx in 2014 -cont f/u with [facility]  Essential hypertension -Refill amlodipine today, well-controlled  Hx of BKA, left (HCC) -follow up with ortho as scheduled    Patient Instructions  Great meeting you this afternoon.  Schedule an annual physical exam in the next few months, come fasting, so we can draw blood work.       Murlean Iba, RN DNP [facility]

## 2018-09-29 NOTE — Patient Instructions (Addendum)
Great meeting you this afternoon.  Schedule an annual physical exam in the next few months, come fasting, so we can draw blood work.

## 2018-09-30 ENCOUNTER — Encounter (INDEPENDENT_AMBULATORY_CARE_PROVIDER_SITE_OTHER): Payer: Self-pay

## 2018-10-05 ENCOUNTER — Telehealth (INDEPENDENT_AMBULATORY_CARE_PROVIDER_SITE_OTHER): Payer: Self-pay | Admitting: Orthopaedic Surgery

## 2018-10-05 ENCOUNTER — Telehealth (INDEPENDENT_AMBULATORY_CARE_PROVIDER_SITE_OTHER): Payer: Self-pay

## 2018-10-05 NOTE — Telephone Encounter (Signed)
Tried calling to prescreen patient prior to appt 03/25, no answer, LMVM

## 2018-10-05 NOTE — Telephone Encounter (Signed)
Patient spouse Luster Landsberg called back/ Asked Screening Questions no to ALL questions. Verified time w/spouse. Pt will be here.

## 2018-10-06 ENCOUNTER — Ambulatory Visit (INDEPENDENT_AMBULATORY_CARE_PROVIDER_SITE_OTHER): Payer: Medicare Other | Admitting: Orthopaedic Surgery

## 2018-10-07 ENCOUNTER — Encounter (INDEPENDENT_AMBULATORY_CARE_PROVIDER_SITE_OTHER): Payer: Self-pay

## 2018-10-07 ENCOUNTER — Encounter (INDEPENDENT_AMBULATORY_CARE_PROVIDER_SITE_OTHER): Payer: Self-pay | Admitting: Orthopaedic Surgery

## 2018-10-07 ENCOUNTER — Other Ambulatory Visit (INDEPENDENT_AMBULATORY_CARE_PROVIDER_SITE_OTHER): Payer: Self-pay | Admitting: Orthopaedic Surgery

## 2018-10-07 MED ORDER — DOXYCYCLINE HYCLATE 100 MG PO TABS: 100.0000 mg | ORAL_TABLET | Freq: Two times a day (BID) | ORAL | 0 refills | Status: DC

## 2018-10-08 ENCOUNTER — Telehealth (INDEPENDENT_AMBULATORY_CARE_PROVIDER_SITE_OTHER): Payer: Self-pay

## 2018-10-08 NOTE — Telephone Encounter (Signed)
Called and lm on vm to advise to call the office so that we can ask pre screen questions prior to appt with CB on Monday.

## 2018-10-11 ENCOUNTER — Encounter: Payer: Self-pay | Admitting: Internal Medicine

## 2018-10-11 ENCOUNTER — Ambulatory Visit (INDEPENDENT_AMBULATORY_CARE_PROVIDER_SITE_OTHER): Payer: Medicare Other | Admitting: Orthopaedic Surgery

## 2018-10-11 ENCOUNTER — Telehealth (INDEPENDENT_AMBULATORY_CARE_PROVIDER_SITE_OTHER): Payer: Self-pay | Admitting: Orthopaedic Surgery

## 2018-10-11 NOTE — Telephone Encounter (Signed)
Patient's wife called and would like for you to give her a call.  CB#[phone].  Thank you.

## 2018-10-11 NOTE — Telephone Encounter (Signed)
Spoke with patient wife and she said she would email a picture of her husbands wound

## 2018-10-13 ENCOUNTER — Other Ambulatory Visit (INDEPENDENT_AMBULATORY_CARE_PROVIDER_SITE_OTHER): Payer: Self-pay | Admitting: Orthopaedic Surgery

## 2018-10-14 ENCOUNTER — Ambulatory Visit: Payer: Self-pay | Admitting: Internal Medicine

## 2018-10-14 ENCOUNTER — Other Ambulatory Visit: Payer: Self-pay

## 2018-10-14 ENCOUNTER — Ambulatory Visit (INDEPENDENT_AMBULATORY_CARE_PROVIDER_SITE_OTHER): Payer: Medicare Other | Admitting: Internal Medicine

## 2018-10-14 DIAGNOSIS — I10 Essential (primary) hypertension: Secondary | ICD-10-CM | POA: Diagnosis not present

## 2018-10-14 DIAGNOSIS — K5903 Drug induced constipation: Secondary | ICD-10-CM | POA: Diagnosis not present

## 2018-10-14 MED ORDER — AMLODIPINE BESYLATE 5 MG PO TABS: 5.0000 mg | ORAL_TABLET | Freq: Every day | ORAL | 1 refills | Status: DC

## 2018-10-14 MED ORDER — MAGNESIUM CITRATE PO SOLN: 1.0000 | Freq: Once | ORAL | 3 refills | Status: AC

## 2018-10-14 MED ORDER — OXYCODONE HCL 5 MG PO TABS: 5.0000 mg | ORAL_TABLET | Freq: Four times a day (QID) | ORAL | 0 refills | Status: DC | PRN

## 2018-10-14 NOTE — Telephone Encounter (Signed)
CB 

## 2018-10-14 NOTE — Progress Notes (Signed)
Virtual Visit via Video Note  I connected with Bruce Deleon on 10/14/18 at  3:00 PM EDT by a video enabled telemedicine application and verified that I am speaking with the correct person using two identifiers.  Location patient: home Location provider: work office Persons participating in the virtual visit: patient, provider, wife Luster Landsberg who assists as patient has ALS and difficulty with technology  I discussed the limitations of evaluation and management by telemedicine and the availability of in person appointments. The patient expressed understanding and agreed to proceed.   HPI: Since being on opioids after his BKA has been having constipation. Has gotten worse past 3 days. Now with some generalized abdominal pain, no N/V. Tried colace and dulcolax. Had small BM last night with some relief. Wants to know what else he can do.  Needs amlodipine refills.   ROS: Constitutional: Denies fever, chills, diaphoresis, appetite change and fatigue.  HEENT: Denies photophobia, eye pain, redness, hearing loss, ear pain, congestion, sore throat, rhinorrhea, sneezing, mouth sores, trouble swallowing, neck pain, neck stiffness and tinnitus.   Respiratory: Denies SOB, DOE, cough, chest tightness,  and wheezing.   Cardiovascular: Denies chest pain, palpitations and leg swelling.  Gastrointestinal: Denies nausea, vomiting, abdominal pain, diarrhea,  blood in stool and abdominal distention.  Genitourinary: Denies dysuria, urgency, frequency, hematuria, flank pain and difficulty urinating.  Endocrine: Denies: hot or cold intolerance, sweats, changes in hair or nails, polyuria, polydipsia. Musculoskeletal: Denies myalgias, back pain, joint swelling, arthralgias and gait problem.  Skin: Denies pallor, rash and wound.  Neurological: Denies dizziness, seizures, syncope, weakness, light-headedness, numbness and headaches.  Hematological: Denies adenopathy. Easy bruising, personal or family bleeding  history  Psychiatric/Behavioral: Denies suicidal ideation, mood changes, confusion, nervousness, sleep disturbance and agitation   Past Medical History:  Diagnosis Date  . 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 ALS (amyotrophic lateral sclerosis) (HCC)   . Chronic respiratory insufficiency 06/12/2014  . Essential hypertension 01/25/2013    Past Surgical History:  Procedure Laterality Date  . AMPUTATION Left 08/06/2018   Procedure: LEFT BELOW KNEE AMPUTATION;  Surgeon: Kathryne Hitch, MD;  Location: [facility];  Service: Orthopedics;  Laterality: Left;  . APPLICATION OF WOUND VAC Left 08/04/2018   Procedure: APPLICATION OF WOUND VAC;  Surgeon: Kathryne Hitch, MD;  Location: [facility];  Service: Orthopedics;  Laterality: Left;  . I&D EXTREMITY Left 08/01/2018   Procedure: IRRIGATION AND DEBRIDEMENT foot and ankle amputation first and second rays;  Surgeon: Kathryne Hitch, MD;  Location: [facility];  Service: Orthopedics;  Laterality: Left;  . I&D EXTREMITY Left 08/04/2018   Procedure: REPEAT IRRIGATION AND DEBRIDEMENT OF LEFT ANKLE;  Surgeon: Kathryne Hitch, MD;  Location: [facility];  Service: Orthopedics;  Laterality: Left;    No Fam Hx of ALS, heart disease, stroke or cancer.  SOCIAL HX:   reports that he has quit smoking. He has never used smokeless tobacco. No history on file for alcohol and drug.   Current Outpatient Medications:  .  albuterol (PROVENTIL HFA;VENTOLIN HFA) 108 (90 Base) MCG/ACT inhaler, Inhale 2 puffs into the lungs every 6 (six) hours as needed for wheezing or shortness of breath., Disp: 1 Inhaler, Rfl: 2 .  amLODipine (NORVASC) 5 MG tablet, Take 1 tablet (5 mg total) by mouth daily., Disp: 90 tablet, Rfl: 1 .  doxycycline (VIBRA-TABS) 100 MG tablet, Take 1 tablet (100 mg total) by mouth 2 (two) times daily., Disp: 30 tablet, Rfl: 0 .  gabapentin (  NEURONTIN) 100 MG capsule, Take 1 capsule (100 mg total) by mouth 3 (three) times  daily., Disp: 90 capsule, Rfl: 0 .  magnesium citrate SOLN, Take 296 mLs (1 Bottle total) by mouth once for 1 dose., Disp: 195 mL, Rfl: 3 .  oxyCODONE (ROXICODONE) 5 MG immediate release tablet, Take 1 tablet (5 mg total) by mouth every 6 (six) hours as needed for severe pain., Disp: 40 tablet, Rfl: 0  EXAM:   VITALS per patient if applicable: None reported  GENERAL: alert, oriented, appears well and in no acute distress  HEENT: atraumatic, conjunttiva clear, no obvious abnormalities on inspection of external nose and ears  NECK: normal movements of the head and neck  LUNGS: on inspection no signs of respiratory distress, breathing rate appears normal, no obvious gross increased work of breathing, gasping or wheezing  CV: no obvious cyanosis  MS: moves all visible extremities without noticeable abnormality  PSYCH/NEURO: pleasant and cooperative, no obvious depression or anxiety, speech and thought processing grossly intact  ASSESSMENT AND PLAN:   Drug-induced constipation  -Continue colace BID, add miralax BID. Will also send in Rx for Mag citrate. Aim for a BM at least every other day.  Essential hypertension  -Norvasc refilled, no recent BP measurements available to me today.     I discussed the assessment and treatment plan with the patient. The patient was provided an opportunity to ask questions and all were answered. The patient agreed with the plan and demonstrated an understanding of the instructions.   The patient was advised to call back or seek an in-person evaluation if the symptoms worsen or if the condition fails to improve as anticipated.    Chaya Jan, MD  [facility]

## 2018-10-14 NOTE — Telephone Encounter (Signed)
Please advise 

## 2018-10-19 ENCOUNTER — Telehealth (INDEPENDENT_AMBULATORY_CARE_PROVIDER_SITE_OTHER): Payer: Self-pay | Admitting: Radiology

## 2018-10-19 NOTE — Telephone Encounter (Signed)
Called and left voicemail asking patient to call us back to answer pre screening questions for appointment on 4/8 

## 2018-10-19 NOTE — Telephone Encounter (Signed)
Patient's wife left a message confirming his appointment for tomorrow.  I called and left voicemail message stating that we are needing to ask some prescreening questions.

## 2018-10-20 ENCOUNTER — Other Ambulatory Visit: Payer: Self-pay

## 2018-10-20 ENCOUNTER — Encounter (INDEPENDENT_AMBULATORY_CARE_PROVIDER_SITE_OTHER): Payer: Self-pay | Admitting: Orthopaedic Surgery

## 2018-10-20 ENCOUNTER — Other Ambulatory Visit (INDEPENDENT_AMBULATORY_CARE_PROVIDER_SITE_OTHER): Payer: Self-pay

## 2018-10-20 ENCOUNTER — Ambulatory Visit (INDEPENDENT_AMBULATORY_CARE_PROVIDER_SITE_OTHER): Payer: Medicare Other | Admitting: Orthopaedic Surgery

## 2018-10-20 DIAGNOSIS — S81802D Unspecified open wound, left lower leg, subsequent encounter: Secondary | ICD-10-CM

## 2018-10-20 DIAGNOSIS — Z89512 Acquired absence of left leg below knee: Secondary | ICD-10-CM

## 2018-10-20 NOTE — Progress Notes (Signed)
The patient is continue to follow-up status post a left below-knee amputation that was performed on January 24 of this year.  We have been dealing with trying to get a wound over the anterior lateral aspect of the stump to heal.  On exam today there is no evidence of infection.  There is a large eschar that I unroofed with a #10 scalpel blade.  I was able to get good bleeding tissue.  There is no evidence of infection and there is no exposed bone.  Organ to continue work on local wound care.  Given the coronavirus pandemic he prefers to try to stay out of the operating room and hospital for now.  I do feel it is reasonable to at least have the wound center take a look at him as a consultation.  I do feel that if we ended up performing a surgical intervention it could be potentially been done as an outpatient.  I gave him reassurance the right now he is doing well and that his wife is doing a good job of taking care of of the wound.  I will see him back myself in 2 weeks.

## 2018-10-28 ENCOUNTER — Other Ambulatory Visit (INDEPENDENT_AMBULATORY_CARE_PROVIDER_SITE_OTHER): Payer: Self-pay | Admitting: Orthopaedic Surgery

## 2018-10-29 ENCOUNTER — Other Ambulatory Visit (INDEPENDENT_AMBULATORY_CARE_PROVIDER_SITE_OTHER): Payer: Self-pay | Admitting: Orthopaedic Surgery

## 2018-10-29 MED ORDER — OXYCODONE HCL 5 MG PO TABS: 5.0000 mg | ORAL_TABLET | Freq: Four times a day (QID) | ORAL | 0 refills | Status: DC | PRN

## 2018-10-29 NOTE — Telephone Encounter (Signed)
Ok to rf? 

## 2018-11-03 ENCOUNTER — Ambulatory Visit (INDEPENDENT_AMBULATORY_CARE_PROVIDER_SITE_OTHER): Payer: Medicare Other | Admitting: Orthopaedic Surgery

## 2018-11-09 ENCOUNTER — Telehealth: Payer: Self-pay | Admitting: Internal Medicine

## 2018-11-09 NOTE — Telephone Encounter (Signed)
So is the problem diarrhea or constipation? Last we spoke he was constipated. Imodium is for diarrhea.Marland KitchenMarland Kitchen

## 2018-11-09 NOTE — Telephone Encounter (Signed)
Doxy.me appointment was scheduled with Dr Ardyth Harps for 11/10/2018. Wife would like to know if he can try imodium tonight or any other OTC?

## 2018-11-09 NOTE — Telephone Encounter (Signed)
He's having small soft bowel movements often.

## 2018-11-09 NOTE — Telephone Encounter (Signed)
Virtual visit 

## 2018-11-09 NOTE — Telephone Encounter (Signed)
Yeah, let's put him on for tomorrow

## 2018-11-09 NOTE — Telephone Encounter (Signed)
Left message on machine for patient to schedule a Doxy.me appointment. 

## 2018-11-09 NOTE — Telephone Encounter (Signed)
Pts wife Luster Landsberg) is calling state that the pt hasn't gotten any better she stated that he completed one bottle of the magnesium citrate at 2pm and has only been passing a little bit at a time when he goes to the restroom.  He has been getting up in down on a newly amputated leg below the knee during the night and he has not been able to get any rest he also has ALS and it is wearing him down.  She didn't know if he needs to get more magnesium citrate or something different if he gets something different it can be sent to Pharm: [facility] on [address]  Pts wife would like to have a call to let her know if he needs to do another virtual appointment or if something will be called in.

## 2018-11-10 ENCOUNTER — Ambulatory Visit (INDEPENDENT_AMBULATORY_CARE_PROVIDER_SITE_OTHER): Payer: Medicare Other | Admitting: Internal Medicine

## 2018-11-10 ENCOUNTER — Other Ambulatory Visit: Payer: Self-pay

## 2018-11-10 DIAGNOSIS — R195 Other fecal abnormalities: Secondary | ICD-10-CM | POA: Diagnosis not present

## 2018-11-10 NOTE — Progress Notes (Signed)
Virtual Visit via Video Note  I connected with Bruce RutterMark W Deleon on 11/10/18 at 10:30 AM EDT by a video enabled telemedicine application and verified that I am speaking with the correct person using two identifiers.  Location patient: home Location provider: work office Persons participating in the virtual visit: patient, provider, wife who assists with technology and history  I discussed the limitations of evaluation and management by telemedicine and the availability of in person appointments. The patient expressed understanding and agreed to proceed.   HPI: I saw him on 4/2 with concerns for constipation after being on narcotics due to his recent BKA. Advised BID Colace and MIralax. She later called us stating that he still had not had a BM so we sent in a bottle of mag citrate. He took it day before yesterday and since then has been having frequent, small amounts of loose stool. No abdominal pain or cramping. No fever. Going to the restroom is difficult for him with his BKA, so he took an imodium yesterday.   ROS: Constitutional: Denies fever, chills, diaphoresis, appetite change and fatigue.  HEENT: Denies photophobia, eye pain, redness, hearing loss, ear pain, congestion, sore throat, rhinorrhea, sneezing, mouth sores, trouble swallowing, neck pain, neck stiffness and tinnitus.   Respiratory: Denies SOB, DOE, cough, chest tightness,  and wheezing.   Cardiovascular: Denies chest pain, palpitations and leg swelling.  Gastrointestinal: Denies nausea, vomiting, abdominal pain, constipation, blood in stool and abdominal distention.  Genitourinary: Denies dysuria, urgency, frequency, hematuria, flank pain and difficulty urinating.  Endocrine: Denies: hot or cold intolerance, sweats, changes in hair or nails, polyuria, polydipsia. Musculoskeletal: Denies myalgias, back pain, joint swelling, arthralgias and gait problem.  Skin: Denies pallor, rash and wound.  Neurological: Denies dizziness,  seizures, syncope, weakness, light-headedness, numbness and headaches.  Hematological: Denies adenopathy. 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/25/2013   ALS (amyotrophic lateral sclerosis) (HCC)    Chronic respiratory insufficiency 06/12/2014   Essential hypertension 01/25/2013    Past Surgical History:  Procedure Laterality Date   AMPUTATION Left 08/06/2018   Procedure: LEFT BELOW KNEE AMPUTATION;  Surgeon: Kathryne HitchBlackman, Christopher Y, MD;  Location: [facility];  Service: Orthopedics;  Laterality: Left;   APPLICATION OF WOUND VAC Left 08/04/2018   Procedure: APPLICATION OF WOUND VAC;  Surgeon: Kathryne HitchBlackman, Christopher Y, MD;  Location: [facility];  Service: Orthopedics;  Laterality: Left;   I&D EXTREMITY Left 08/01/2018   Procedure: IRRIGATION AND DEBRIDEMENT foot and ankle amputation first and second rays;  Surgeon: Kathryne HitchBlackman, Christopher Y, MD;  Location: [facility];  Service: Orthopedics;  Laterality: Left;   I&D EXTREMITY Left 08/04/2018   Procedure: REPEAT IRRIGATION AND DEBRIDEMENT OF LEFT ANKLE;  Surgeon: Kathryne HitchBlackman, Christopher Y, MD;  Location: [facility];  Service: Orthopedics;  Laterality: Left;    No family history on file.  SOCIAL HX:   reports that he has quit smoking. He has never used smokeless tobacco. No history on file for alcohol and drug.   Current Outpatient Medications:    albuterol (PROVENTIL HFA;VENTOLIN HFA) 108 (90 Base) MCG/ACT inhaler, Inhale 2 puffs into the lungs every 6 (six) hours as needed for wheezing or shortness of breath., Disp: 1 Inhaler, Rfl: 2   amLODipine (NORVASC) 5 MG tablet, Take 1 tablet (5 mg total) by mouth daily., Disp: 90 tablet, Rfl: 1  doxycycline (VIBRA-TABS) 100 MG tablet, Take 1 tablet (100 mg total) by mouth 2 (two) times  daily., Disp: 30 tablet, Rfl: 0   gabapentin (NEURONTIN) 100 MG capsule, Take 1 capsule (100 mg total) by mouth 3 (three) times daily., Disp: 90 capsule, Rfl: 0   oxyCODONE (ROXICODONE) 5 MG immediate release tablet, Take 1 tablet (5 mg total) by mouth every 6 (six) hours as needed for severe pain., Disp: 40 tablet, Rfl: 0  EXAM:   VITALS per patient if applicable: none reported  GENERAL: alert, oriented, appears well and in no acute distress  HEENT: atraumatic, conjunttiva clear, no obvious abnormalities on inspection of external nose and ears  NECK: normal movements of the head and neck  LUNGS: on inspection no signs of respiratory distress, breathing rate appears normal, no obvious gross increased work of breathing, gasping or wheezing  CV: no obvious cyanosis  MS: moves all visible extremities without noticeable abnormality  PSYCH/NEURO: pleasant and cooperative, no obvious depression or anxiety, speech and thought processing grossly intact  ASSESSMENT AND PLAN:   Loose stools  -After being on colace, miralax, mag citrate for severe narcotic-induced constipation. -Explained and reassured that this is anticipated and expected. -Advised to stop mag citrate and miralax for the next couple days and continue colace only for now. -Aim for a soft BM at least every other day, if 3 days without a BM he is to resume miralax. -Advised against continued imodium use.   I discussed the assessment and treatment plan with the patient. The patient was provided an opportunity to ask questions and all were answered. The patient agreed with the plan and demonstrated an understanding of the instructions.   The patient was advised to call back or seek an in-person evaluation if the symptoms worsen or if the condition fails to improve as anticipated.    Chaya Jan, MD  [facility] at [facility]

## 2018-11-22 ENCOUNTER — Encounter (HOSPITAL_BASED_OUTPATIENT_CLINIC_OR_DEPARTMENT_OTHER): Payer: Medicare Other | Attending: Internal Medicine

## 2018-11-22 DIAGNOSIS — Y835 Amputation of limb(s) as the cause of abnormal reaction of the patient, or of later complication, without mention of misadventure at the time of the procedure: Secondary | ICD-10-CM | POA: Insufficient documentation

## 2018-11-22 DIAGNOSIS — I10 Essential (primary) hypertension: Secondary | ICD-10-CM | POA: Insufficient documentation

## 2018-11-22 DIAGNOSIS — Z89512 Acquired absence of left leg below knee: Secondary | ICD-10-CM | POA: Insufficient documentation

## 2018-11-22 DIAGNOSIS — T8781 Dehiscence of amputation stump: Secondary | ICD-10-CM | POA: Diagnosis not present

## 2018-11-23 ENCOUNTER — Ambulatory Visit: Payer: Medicare Other | Admitting: Orthopaedic Surgery

## 2018-11-29 ENCOUNTER — Encounter: Payer: Self-pay | Admitting: Orthopaedic Surgery

## 2018-11-29 ENCOUNTER — Other Ambulatory Visit (INDEPENDENT_AMBULATORY_CARE_PROVIDER_SITE_OTHER): Payer: Self-pay | Admitting: Orthopaedic Surgery

## 2018-11-29 ENCOUNTER — Ambulatory Visit: Payer: Medicare Other | Admitting: Orthopaedic Surgery

## 2018-11-29 ENCOUNTER — Other Ambulatory Visit: Payer: Self-pay

## 2018-11-29 VITALS — Ht 72.0 in | Wt 130.0 lb

## 2018-11-29 DIAGNOSIS — T8781 Dehiscence of amputation stump: Secondary | ICD-10-CM | POA: Diagnosis not present

## 2018-11-29 DIAGNOSIS — S81802D Unspecified open wound, left lower leg, subsequent encounter: Secondary | ICD-10-CM

## 2018-11-29 DIAGNOSIS — Z89512 Acquired absence of left leg below knee: Secondary | ICD-10-CM | POA: Diagnosis not present

## 2018-11-29 MED ORDER — OXYCODONE HCL 5 MG PO TABS: 5.0000 mg | ORAL_TABLET | Freq: Four times a day (QID) | ORAL | 0 refills | Status: DC | PRN

## 2018-11-29 NOTE — Progress Notes (Signed)
The patient is here today for continued follow-up with a left below-knee amputation stump that is having some superficial wound issues in terms of not healing completely.  He is now the wound center.  They are to treat this with a silver-based dressing which I feel is also appropriate.  His wife did show me a photograph of the wound today.  There is a dressing in place today so I would not take this off since this was just put on earlier today.  The photograph does show good granulation tissue.  A long thorough discussion about wound treatment.  I do feel that a revision amputation could get him into a prosthesis quicker however I am not opposed to the wound center his efforts in getting this to heal given their good track record of getting wounds to heal such as this.  He will continue their instructions for wound care and I reviewed these as well with him.  I would like to see him back in 2 weeks to reevaluate his left BKA stump.

## 2018-11-29 NOTE — Telephone Encounter (Signed)
Please advise 

## 2018-12-07 DIAGNOSIS — T8781 Dehiscence of amputation stump: Secondary | ICD-10-CM | POA: Diagnosis not present

## 2018-12-09 ENCOUNTER — Other Ambulatory Visit (INDEPENDENT_AMBULATORY_CARE_PROVIDER_SITE_OTHER): Payer: Self-pay | Admitting: Orthopaedic Surgery

## 2018-12-10 MED ORDER — OXYCODONE HCL 5 MG PO TABS: 5.0000 mg | ORAL_TABLET | Freq: Four times a day (QID) | ORAL | 0 refills | Status: DC | PRN

## 2018-12-10 NOTE — Telephone Encounter (Signed)
Ok to rf? 

## 2018-12-12 ENCOUNTER — Encounter: Payer: Self-pay | Admitting: Radiology

## 2018-12-13 ENCOUNTER — Encounter (HOSPITAL_BASED_OUTPATIENT_CLINIC_OR_DEPARTMENT_OTHER): Payer: Medicare Other | Attending: Internal Medicine

## 2018-12-13 ENCOUNTER — Ambulatory Visit: Payer: Medicare Other | Admitting: Orthopaedic Surgery

## 2018-12-13 DIAGNOSIS — T8781 Dehiscence of amputation stump: Secondary | ICD-10-CM | POA: Insufficient documentation

## 2018-12-13 DIAGNOSIS — Y835 Amputation of limb(s) as the cause of abnormal reaction of the patient, or of later complication, without mention of misadventure at the time of the procedure: Secondary | ICD-10-CM | POA: Insufficient documentation

## 2018-12-13 DIAGNOSIS — Z89512 Acquired absence of left leg below knee: Secondary | ICD-10-CM | POA: Insufficient documentation

## 2018-12-13 DIAGNOSIS — I10 Essential (primary) hypertension: Secondary | ICD-10-CM | POA: Insufficient documentation

## 2018-12-13 DIAGNOSIS — F17210 Nicotine dependence, cigarettes, uncomplicated: Secondary | ICD-10-CM | POA: Insufficient documentation

## 2018-12-15 ENCOUNTER — Telehealth: Payer: Self-pay

## 2018-12-15 NOTE — Telephone Encounter (Signed)
Patient's wife Luster Landsberg called stating that patient needs a letter to verify that patient is under doctor's care.  Would like for letter to be faxed to [phone]. CB# is [phone].  Please advise.  Thank You.

## 2018-12-16 NOTE — Telephone Encounter (Signed)
LMOM for patient letting them know note ready at front desk

## 2018-12-20 ENCOUNTER — Encounter: Payer: Self-pay | Admitting: Internal Medicine

## 2018-12-22 ENCOUNTER — Other Ambulatory Visit (INDEPENDENT_AMBULATORY_CARE_PROVIDER_SITE_OTHER): Payer: Self-pay | Admitting: Orthopaedic Surgery

## 2018-12-22 ENCOUNTER — Other Ambulatory Visit: Payer: Self-pay

## 2018-12-22 ENCOUNTER — Ambulatory Visit (INDEPENDENT_AMBULATORY_CARE_PROVIDER_SITE_OTHER): Payer: Medicare Other | Admitting: Adult Health

## 2018-12-22 ENCOUNTER — Encounter: Payer: Self-pay | Admitting: Adult Health

## 2018-12-22 DIAGNOSIS — K5903 Drug induced constipation: Secondary | ICD-10-CM

## 2018-12-22 MED ORDER — MAGNESIUM CITRATE PO SOLN: 1.0000 | Freq: Once | ORAL | 0 refills | Status: AC

## 2018-12-22 MED ORDER — OXYCODONE HCL 5 MG PO TABS: 5.0000 mg | ORAL_TABLET | Freq: Four times a day (QID) | ORAL | 0 refills | Status: DC | PRN

## 2018-12-22 NOTE — Progress Notes (Signed)
Virtual Visit via Video Note  I connected with Bruce Deleon  on 12/22/18 at  2:00 PM EDT by a video enabled telemedicine application and verified that I am speaking with the correct person using two identifiers.  Location patient: home Location provider:work or home office Persons participating in the virtual visit: patient, provider, and wife   I discussed the limitations of evaluation and management by telemedicine and the availability of in person appointments. The patient expressed understanding and agreed to proceed.   HPI: 63 year old male being evaluated today for constipation.  Was seen by his PCP on 10/14/2018 suspected drug-induced constipation ( due to oxycodone).  At this time he was advised to continue with Colace twice daily, add MiraLAX twice daily and a prescription for mag citrate was also sent in.  Today he reports that he has not had a bowel movement "in 3 weeks".  His wife reports that he will go to the restroom and when he wipes he does have stool on the toilet paper.  No blood has been noticed.  He is currently taking MiraLAX every other day and Colace once daily.  He is experiencing cramping in his abdomen.  Is eating well drinking plenty of fluids.  Denies nausea or vomiting.  He is passing gas.  His wife has gone him prune juice and prunes a few days ago but has not started this yet.   ROS: See pertinent positives and negatives per HPI.  Past Medical History:  Diagnosis Date  . 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 ALS (amyotrophic lateral sclerosis) (HCC)   . Chronic respiratory insufficiency 06/12/2014  . Essential hypertension 01/25/2013    Past Surgical History:  Procedure Laterality Date  . AMPUTATION Left 08/06/2018   Procedure: LEFT BELOW KNEE AMPUTATION;  Surgeon: Kathryne HitchBlackman, Christopher Y, MD;  Location: [facility];  Service: Orthopedics;  Laterality: Left;  . APPLICATION OF WOUND VAC Left 08/04/2018   Procedure: APPLICATION OF WOUND VAC;   Surgeon: Kathryne HitchBlackman, Christopher Y, MD;  Location: [facility];  Service: Orthopedics;  Laterality: Left;  . I&D EXTREMITY Left 08/01/2018   Procedure: IRRIGATION AND DEBRIDEMENT foot and ankle amputation first and second rays;  Surgeon: Kathryne HitchBlackman, Christopher Y, MD;  Location: [facility];  Service: Orthopedics;  Laterality: Left;  . I&D EXTREMITY Left 08/04/2018   Procedure: REPEAT IRRIGATION AND DEBRIDEMENT OF LEFT ANKLE;  Surgeon: Kathryne HitchBlackman, Christopher Y, MD;  Location: [facility];  Service: Orthopedics;  Laterality: Left;    No family history on file.    Current Outpatient Medications:  .  albuterol (PROVENTIL HFA;VENTOLIN HFA) 108 (90 Base) MCG/ACT inhaler, Inhale 2 puffs into the lungs every 6 (six) hours as needed for wheezing or shortness of breath., Disp: 1 Inhaler, Rfl: 2 .  amLODipine (NORVASC) 5 MG tablet, Take 1 tablet (5 mg total) by mouth daily., Disp: 90 tablet, Rfl: 1 .  doxycycline (VIBRA-TABS) 100 MG tablet, Take 1 tablet (100 mg total) by mouth 2 (two) times daily., Disp: 30 tablet, Rfl: 0 .  gabapentin (NEURONTIN) 100 MG capsule, Take 1 capsule (100 mg total) by mouth 3 (three) times daily., Disp: 90 capsule, Rfl: 0 .  oxyCODONE (ROXICODONE) 5 MG immediate release tablet, Take 1 tablet (5 mg total) by mouth every 6 (six) hours as needed for severe pain., Disp: 40 tablet, Rfl: 0  EXAM:  VITALS per patient if applicable:  GENERAL: alert, oriented, appears well and in no acute distress  HEENT: atraumatic, conjunttiva clear, no obvious abnormalities on inspection of  external nose and ears  NECK: normal movements of the head and neck  LUNGS: on inspection no signs of respiratory distress, breathing rate appears normal, no obvious gross SOB, gasping or wheezing  CV: no obvious cyanosis  MS: moves all visible extremities without noticeable abnormality  PSYCH/NEURO: pleasant and cooperative, no obvious depression or anxiety, speech and thought processing grossly intact  ASSESSMENT AND  PLAN:  1. Drug-induced constipation -Advised to use MiraLAX twice daily as well as Colace twice daily. -Drink large glass of warm prune juice today and then every morning -We will send in bottle of mag citrate take if he has not had a bowel movement this evening. - follow-up tomorrow if no bowel movement today   Discussed the following assessment and plan:    I discussed the assessment and treatment plan with the patient. The patient was provided an opportunity to ask questions and all were answered. The patient agreed with the plan and demonstrated an understanding of the instructions.   The patient was advised to call back or seek an in-person evaluation if the symptoms worsen or if the condition fails to improve as anticipated.   Dorothyann Peng, NP

## 2018-12-22 NOTE — Telephone Encounter (Signed)
Dr. Blackman patient 

## 2018-12-22 NOTE — Telephone Encounter (Signed)
Please advise 

## 2018-12-29 ENCOUNTER — Ambulatory Visit: Payer: Medicare Other | Admitting: Physician Assistant

## 2018-12-29 DIAGNOSIS — Z89512 Acquired absence of left leg below knee: Secondary | ICD-10-CM | POA: Diagnosis not present

## 2018-12-29 DIAGNOSIS — Y835 Amputation of limb(s) as the cause of abnormal reaction of the patient, or of later complication, without mention of misadventure at the time of the procedure: Secondary | ICD-10-CM | POA: Diagnosis not present

## 2018-12-29 DIAGNOSIS — I10 Essential (primary) hypertension: Secondary | ICD-10-CM | POA: Diagnosis not present

## 2018-12-29 DIAGNOSIS — F17210 Nicotine dependence, cigarettes, uncomplicated: Secondary | ICD-10-CM | POA: Diagnosis not present

## 2018-12-29 DIAGNOSIS — T8781 Dehiscence of amputation stump: Secondary | ICD-10-CM | POA: Diagnosis not present

## 2019-01-05 DIAGNOSIS — T8781 Dehiscence of amputation stump: Secondary | ICD-10-CM | POA: Diagnosis not present

## 2019-01-09 ENCOUNTER — Other Ambulatory Visit (INDEPENDENT_AMBULATORY_CARE_PROVIDER_SITE_OTHER): Payer: Self-pay | Admitting: Orthopaedic Surgery

## 2019-01-10 MED ORDER — OXYCODONE HCL 5 MG PO TABS: 5.0000 mg | ORAL_TABLET | Freq: Four times a day (QID) | ORAL | 0 refills | Status: DC | PRN

## 2019-01-10 NOTE — Telephone Encounter (Signed)
Please advise 

## 2019-01-10 NOTE — Telephone Encounter (Signed)
Please call and let him know that at this point, I need him off of narcotics.  He is far enough out from his surgery.  I can fill it just one more time only.

## 2019-01-10 NOTE — Telephone Encounter (Signed)
This is CB pt.

## 2019-01-12 ENCOUNTER — Encounter (HOSPITAL_BASED_OUTPATIENT_CLINIC_OR_DEPARTMENT_OTHER): Payer: Medicare Other | Attending: Physician Assistant

## 2019-01-12 DIAGNOSIS — T8781 Dehiscence of amputation stump: Secondary | ICD-10-CM | POA: Diagnosis not present

## 2019-01-12 DIAGNOSIS — Z89512 Acquired absence of left leg below knee: Secondary | ICD-10-CM | POA: Diagnosis not present

## 2019-01-12 DIAGNOSIS — F17210 Nicotine dependence, cigarettes, uncomplicated: Secondary | ICD-10-CM | POA: Insufficient documentation

## 2019-01-12 DIAGNOSIS — I10 Essential (primary) hypertension: Secondary | ICD-10-CM | POA: Diagnosis not present

## 2019-01-12 DIAGNOSIS — Y835 Amputation of limb(s) as the cause of abnormal reaction of the patient, or of later complication, without mention of misadventure at the time of the procedure: Secondary | ICD-10-CM | POA: Insufficient documentation

## 2019-01-26 DIAGNOSIS — T8781 Dehiscence of amputation stump: Secondary | ICD-10-CM | POA: Diagnosis not present

## 2019-02-02 DIAGNOSIS — T8781 Dehiscence of amputation stump: Secondary | ICD-10-CM | POA: Diagnosis not present

## 2019-02-04 ENCOUNTER — Telehealth: Payer: Self-pay | Admitting: Orthopaedic Surgery

## 2019-02-04 NOTE — Telephone Encounter (Signed)
Called patient left message to return call to schedule an appointment with Dr Ninfa Linden     Pain in leg  F/U  On healing process

## 2019-02-07 ENCOUNTER — Other Ambulatory Visit (INDEPENDENT_AMBULATORY_CARE_PROVIDER_SITE_OTHER): Payer: Self-pay | Admitting: Orthopaedic Surgery

## 2019-02-07 NOTE — Telephone Encounter (Signed)
Please advise 

## 2019-02-09 DIAGNOSIS — T8781 Dehiscence of amputation stump: Secondary | ICD-10-CM | POA: Diagnosis not present

## 2019-02-10 ENCOUNTER — Encounter: Payer: Self-pay | Admitting: Orthopaedic Surgery

## 2019-02-10 ENCOUNTER — Other Ambulatory Visit: Payer: Self-pay

## 2019-02-10 ENCOUNTER — Ambulatory Visit (INDEPENDENT_AMBULATORY_CARE_PROVIDER_SITE_OTHER): Payer: Medicare Other | Admitting: Orthopaedic Surgery

## 2019-02-10 ENCOUNTER — Telehealth: Payer: Self-pay | Admitting: Orthopaedic Surgery

## 2019-02-10 DIAGNOSIS — S81802D Unspecified open wound, left lower leg, subsequent encounter: Secondary | ICD-10-CM

## 2019-02-10 DIAGNOSIS — Z89512 Acquired absence of left leg below knee: Secondary | ICD-10-CM | POA: Diagnosis not present

## 2019-02-10 DIAGNOSIS — G894 Chronic pain syndrome: Secondary | ICD-10-CM

## 2019-02-10 NOTE — Progress Notes (Signed)
The patient is a very pleasant 63 year old gentleman with ALS who is also status post a left below knee amputation due to a severe infection of his left foot and ankle.  He has been on chronic narcotics due to his ALS and this amputation.  At this point he understands that I would like to at least have a chronic pain specialist take a look at him to see if there is anything they would recommend given his ALS and the pain that he may have associated with that and whether or not they would agree to take him on as a pain management patient.  He has not been abusing any of the medications that we prescribed for him.  He is also been going to the wound center and they now have a VAC on his left BKA stump on the small wound and they been changing that weekly.  His wife is with him today and she showed me pictures of what it looks like in June and the wound is gotten significantly better and is less deep.  The VAC is in place today so I did not remove it but is in a small area.  There is no evidence of infection at all.  He can fully flex his left knee but he lacks full extension by a few degrees which I am worried about not told him why source prosthetic fitting years.  We will continue this current wound treatment regimen and I would like to see him back in 6 weeks for an assessment.  I have encouraged him to go back by [facility] and see what they think about his leg as well.  All question concerns were answered and addressed.

## 2019-02-10 NOTE — Telephone Encounter (Signed)
Received call from patient's wife asking for a call back concerning the appointment today. Renee said she thought she and Kaisyn left before the appointment was finished. The number to contact renee is [phone]

## 2019-02-11 NOTE — Telephone Encounter (Signed)
Emailed patient and let her know that her appt was over yesterday and they didn't miss anything

## 2019-02-16 ENCOUNTER — Encounter (HOSPITAL_BASED_OUTPATIENT_CLINIC_OR_DEPARTMENT_OTHER): Payer: Medicare Other | Attending: Internal Medicine

## 2019-02-16 DIAGNOSIS — L97822 Non-pressure chronic ulcer of other part of left lower leg with fat layer exposed: Secondary | ICD-10-CM | POA: Diagnosis not present

## 2019-02-16 DIAGNOSIS — F17210 Nicotine dependence, cigarettes, uncomplicated: Secondary | ICD-10-CM | POA: Diagnosis not present

## 2019-02-16 DIAGNOSIS — Z89512 Acquired absence of left leg below knee: Secondary | ICD-10-CM | POA: Diagnosis not present

## 2019-02-16 DIAGNOSIS — I10 Essential (primary) hypertension: Secondary | ICD-10-CM | POA: Diagnosis not present

## 2019-02-23 DIAGNOSIS — L97822 Non-pressure chronic ulcer of other part of left lower leg with fat layer exposed: Secondary | ICD-10-CM | POA: Diagnosis not present

## 2019-03-09 DIAGNOSIS — L97822 Non-pressure chronic ulcer of other part of left lower leg with fat layer exposed: Secondary | ICD-10-CM | POA: Diagnosis not present

## 2019-03-16 ENCOUNTER — Encounter: Payer: Self-pay | Admitting: Physical Medicine and Rehabilitation

## 2019-03-16 ENCOUNTER — Encounter (HOSPITAL_BASED_OUTPATIENT_CLINIC_OR_DEPARTMENT_OTHER): Payer: Medicare Other | Attending: Physician Assistant

## 2019-03-16 DIAGNOSIS — I10 Essential (primary) hypertension: Secondary | ICD-10-CM | POA: Diagnosis not present

## 2019-03-16 DIAGNOSIS — L97822 Non-pressure chronic ulcer of other part of left lower leg with fat layer exposed: Secondary | ICD-10-CM | POA: Diagnosis not present

## 2019-03-16 DIAGNOSIS — Z89512 Acquired absence of left leg below knee: Secondary | ICD-10-CM | POA: Insufficient documentation

## 2019-03-16 DIAGNOSIS — F17210 Nicotine dependence, cigarettes, uncomplicated: Secondary | ICD-10-CM | POA: Insufficient documentation

## 2019-03-18 ENCOUNTER — Other Ambulatory Visit: Payer: Self-pay

## 2019-03-23 ENCOUNTER — Ambulatory Visit: Payer: Medicare Other | Admitting: Physical Medicine and Rehabilitation

## 2019-03-23 DIAGNOSIS — L97822 Non-pressure chronic ulcer of other part of left lower leg with fat layer exposed: Secondary | ICD-10-CM | POA: Diagnosis not present

## 2019-03-24 ENCOUNTER — Ambulatory Visit: Payer: Medicare Other | Admitting: Orthopaedic Surgery

## 2019-03-29 ENCOUNTER — Encounter: Payer: Medicare Other | Admitting: Physical Medicine and Rehabilitation

## 2019-03-30 DIAGNOSIS — L97822 Non-pressure chronic ulcer of other part of left lower leg with fat layer exposed: Secondary | ICD-10-CM | POA: Diagnosis not present

## 2019-03-31 ENCOUNTER — Ambulatory Visit: Payer: Medicare Other | Admitting: Physical Medicine & Rehabilitation

## 2019-04-06 DIAGNOSIS — L97822 Non-pressure chronic ulcer of other part of left lower leg with fat layer exposed: Secondary | ICD-10-CM | POA: Diagnosis not present

## 2019-04-13 ENCOUNTER — Encounter (HOSPITAL_BASED_OUTPATIENT_CLINIC_OR_DEPARTMENT_OTHER): Payer: Medicare Other | Attending: Physician Assistant | Admitting: Physician Assistant

## 2019-04-13 ENCOUNTER — Encounter: Payer: Self-pay | Admitting: Physical Medicine and Rehabilitation

## 2019-04-13 ENCOUNTER — Other Ambulatory Visit: Payer: Self-pay

## 2019-04-13 ENCOUNTER — Encounter
Payer: Medicare Other | Attending: Physical Medicine and Rehabilitation | Admitting: Physical Medicine and Rehabilitation

## 2019-04-13 VITALS — BP 184/108 | HR 89 | Temp 97.7°F | Ht 72.0 in | Wt 135.0 lb

## 2019-04-13 DIAGNOSIS — F17210 Nicotine dependence, cigarettes, uncomplicated: Secondary | ICD-10-CM | POA: Insufficient documentation

## 2019-04-13 DIAGNOSIS — E440 Moderate protein-calorie malnutrition: Secondary | ICD-10-CM | POA: Insufficient documentation

## 2019-04-13 DIAGNOSIS — Z89512 Acquired absence of left leg below knee: Secondary | ICD-10-CM | POA: Diagnosis present

## 2019-04-13 DIAGNOSIS — T8781 Dehiscence of amputation stump: Secondary | ICD-10-CM | POA: Diagnosis present

## 2019-04-13 DIAGNOSIS — G546 Phantom limb syndrome with pain: Secondary | ICD-10-CM | POA: Diagnosis present

## 2019-04-13 DIAGNOSIS — I10 Essential (primary) hypertension: Secondary | ICD-10-CM | POA: Insufficient documentation

## 2019-04-13 DIAGNOSIS — Y835 Amputation of limb(s) as the cause of abnormal reaction of the patient, or of later complication, without mention of misadventure at the time of the procedure: Secondary | ICD-10-CM | POA: Diagnosis not present

## 2019-04-13 DIAGNOSIS — G1221 Amyotrophic lateral sclerosis: Secondary | ICD-10-CM | POA: Insufficient documentation

## 2019-04-13 DIAGNOSIS — Z5181 Encounter for therapeutic drug level monitoring: Secondary | ICD-10-CM | POA: Diagnosis present

## 2019-04-13 MED ORDER — TRAMADOL HCL 50 MG PO TABS: 100.0000 mg | ORAL_TABLET | Freq: Three times a day (TID) | ORAL | 0 refills | Status: AC | PRN

## 2019-04-13 NOTE — Progress Notes (Signed)
Subjective:    Patient ID: Bruce Deleon, male    DOB: 17-Apr-1956, 63 y.o.   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 Here for pain and rehab evaluation.    Has checked for DM- doesn't have it. Did a vascular study- has good vessels in LLE.  Wound center has mentioned a little/tiny ligament and skin/granulaiton doesn't want to cover it. Has trimmed ligament, but slightly there. Goes back to wound care center today. Size of pinhole.  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 Never done mirror therapy. Thinks has done all the therapy has available this year. Phantom pain- mainly aching pain- sometimes itching  Residual limb pain- no cramping; more aching.   Thinks has muscle spasticity- jumps for no reason; and has tremors. Not painful- doesn't get in the way of function.  Sees Dr Estella Huskunheim- Neurology- [facility]- seen for his  ALS; hasn't been to ALS clinic in [place]- 2 years ago.  Hops around  A "little"- with RW- stayed in bed x 3 months basically, but up now exercising- exhausted since didn't do HEP for 3 months- doing HEP every other day. Can get to Bathroom/toilet; doing sponge baths- problems with shower. Knows contractors to help- planning on doing it soon.  On oxycodone- doesn't need every day. Some days, not as bad.   Social Hx: Has been taking care of Mother for past few years- [place] now [place] [facility] now. Having to do medicaid application, etc- x 4 months, very stressful   Pain Inventory Average Pain 8 Pain Right Now 1 My pain is intermittent, burning and tingling  In the last 24 hours, has pain interfered with the following? General activity 0 Relation with others 0 Enjoyment of life 2 What TIME of day is your pain at its worst? night Sleep (in general) Poor  Pain is worse with:  standing Pain improves with: medication Relief from Meds: 6  Mobility use a wheelchair transfers alone  Function disabled: date disabled .  Neuro/Psych weakness tremor tingling trouble walking spasms loss of taste or smell  Prior Studies Any changes since last visit?  no  Physicians involved in your care Any changes since last visit?  no   History reviewed. No pertinent family history. Social History   Socioeconomic History  . Marital status: Married    Spouse name: Not on file  . Number of children: Not on file  . Years of education: Not on file  . Highest education level: Not on file  Occupational History  . Not on file  Social Needs  . Financial resource strain: Not on file  . Food insecurity    Worry: Not on file    Inability: Not on file  . Transportation needs    Medical: Not on file    Non-medical: Not on file  Tobacco Use  . Smoking status: Former Games developermoker  . Smokeless tobacco: Never Used  Substance and Sexual Activity  . Alcohol use: Not on file  . Drug use: Not on file  . Sexual activity: Not on file  Lifestyle  . Physical activity    Days per week: Not on file    Minutes per session: Not on file  . Stress: Not on file  Relationships  . Social connections    Talks on phone: Not on  file    Gets together: Not on file    Attends religious service: Not on file    Active member of club or organization: Not on file    Attends meetings of clubs or organizations: Not on file    Relationship status: Not on file  Other Topics Concern  . Not on file  Social History Narrative  . Not on file   Past Surgical History:  Procedure Laterality Date  . AMPUTATION Left 08/06/2018   Procedure: LEFT BELOW KNEE AMPUTATION;  Surgeon: Kathryne Hitch, MD;  Location: [facility];  Service: Orthopedics;  Laterality: Left;  . APPLICATION OF WOUND VAC Left 08/04/2018   Procedure: APPLICATION OF WOUND VAC;  Surgeon: Kathryne Hitch, MD;  Location: [facility];   Service: Orthopedics;  Laterality: Left;  . I&D EXTREMITY Left 08/01/2018   Procedure: IRRIGATION AND DEBRIDEMENT foot and ankle amputation first and second rays;  Surgeon: Kathryne Hitch, MD;  Location: [facility];  Service: Orthopedics;  Laterality: Left;  . I&D EXTREMITY Left 08/04/2018   Procedure: REPEAT IRRIGATION AND DEBRIDEMENT OF LEFT ANKLE;  Surgeon: Kathryne Hitch, MD;  Location: [facility];  Service: Orthopedics;  Laterality: Left;   Past Medical History:  Diagnosis Date  . 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 ALS (amyotrophic lateral sclerosis) (HCC)   . Chronic respiratory insufficiency 06/12/2014  . Essential hypertension 01/25/2013   BP (!) 184/108   Pulse 89   Temp 97.7 F (36.5 C)   Ht 6' (1.829 m)   Wt 135 lb (61.2 kg)   SpO2 97%   BMI 18.31 kg/m   Opioid Risk Score:   Fall Risk Score:  `1  Depression screen PHQ 2/9  Depression screen PHQ 2/9 09/29/2018  Decreased Interest 0  Down, Depressed, Hopeless 0  PHQ - 2 Score 0     Review of Systems  Constitutional: Positive for appetite change and unexpected weight change.  HENT: Negative.   Eyes: Negative.   Respiratory: Negative.   Cardiovascular: Positive for leg swelling.  Gastrointestinal: Negative.   Endocrine: Negative.   Genitourinary: Negative.   Musculoskeletal: Positive for arthralgias, gait problem, joint swelling and myalgias.  Skin: Negative.   Allergic/Immunologic: Negative.   Neurological: Positive for tremors, weakness and numbness.  Hematological: Negative.   Psychiatric/Behavioral: Negative.   All other systems reviewed and are negative.      Objective:   Physical Exam  Awake, alert, appropriate, accompanied by wife, in manual w/c, NAD No edema of LLE/L BKA  L BKA- skin- has a divet, but in divet has a tiny area- almost pinhole that hasn't healed completely- also had reaction to adhesive, and has zinc ointment around wound.  MS- RLE 5/5  And LLE 5/5  in HF/KE and 4-/5 in [facility] Neuro- no hoffman's, no clonus in RLE  Loss of intrinsics in hands B/L Overall, appears atrophied in muscles Skinny- can see tailbone and ribs Not a lot of muscle mass Cachetic- tailbone sticks out    Assessment & Plan:   Pt is a 63 yr old male with L BKA , chronic pain due to L BKA with residual limb pain and nerve/phantom pain (worst at night), and ALS as well as uncontrolled HTN  1. Mirror therapy by Lars Pinks.  Needs 1- 2 visits  2. HTN_ needs to be better about taking meds- suggest an alarm on phone, or next to toothbrush. Put in med box for weekly meds.  3. Residual limb pain- Always stayed  on oxy- never tried Hydrocodone-if Tramadol isn't effective, will switch back to Hydrocodone if required- pt to call if needed.    4. Nerve pain- Try tramadol 50- 100 TID (3x/day) as needed #180-can take with a tylenol will order drug screen and pain contract- been out of oxycodone since 1+ months ago.   5. ALS- con't seeing Neurology/for ALS  6. Protein malnutrition- moderate-severe W/C- got when had surgery- needs a new cushion alreadyCon-way. Needs ROHO w/c cushion due to very skinny/cachetic from muscle atrophy/his tailbone sticks out-   7. F/U in 6 weeks   I spent a total of 45 minutes on appointment more than 25 minutes educating pt on pain, constipation due to pain meds, and nerve/phantom pain

## 2019-04-13 NOTE — Patient Instructions (Signed)
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 Mirror therapy by Jobe Marker.  Needs 1- 2 visits  2. HTN_ needs to be better about taking meds- suggest an alarm on phone, or next to toothbrush. Put in med box for weekly meds.  3. Residual limb pain- Always stayed on oxy- never tried Hydrocodone-if Tramadol isn't effective, will switch back to Hydrocodone if required- pt to call if needed.    4. Nerve pain- Try tramadol 50- 100 TID (3x/day) as needed #180-can take with a tylenol will order drug screen and pain contract- been out of oxycodone since 1+ months ago.   5. ALS- con't seeing Neurology/for ALS  6. W/C- got when had surgery- needs a new cushion alreadyCon-way.   7. F/U in 6 weeks

## 2019-04-13 NOTE — Addendum Note (Signed)
Addended by: Marland Mcalpine B on: 04/13/2019 10:18 AM   Modules accepted: Orders

## 2019-04-18 ENCOUNTER — Telehealth: Payer: Self-pay | Admitting: *Deleted

## 2019-04-18 LAB — DRUG TOX METHYLPHEN W/CONF,ORAL FLD: Methylphenidate: NEGATIVE ng/mL (ref <1.0)

## 2019-04-18 LAB — DRUG TOX ALC METAB W/CON, ORAL FLD: Alcohol Metabolite: NEGATIVE ng/mL (ref <25)

## 2019-04-18 NOTE — Progress Notes (Signed)
Bruce Deleon, Bruce W. ([id number]) Visit Report for 04/13/2019 Chief Complaint Document Details Patient Name: Bruce Deleon, Bruce W. 04/13/2019 4:00 Date of Service: PM Medical Record [id number] Number: Patient Account Number: [id number] Date of Birth/Sex: Treating RN: 23-May-1956 (63 y.o. Damaris SchoonerM) Boehlein, Linda Other Clinician: Philip AspenHERNANDEZ ACOSTA, Primary Care Provider: Minerva EndsESTELA Treating Lenda KelpStone III, Allanna Bresee Provider/Extender: Philip AspenHERNANDEZ ACOSTA, Referring Provider: Jacquelin HawkingESTELA Weeks in Treatment: 20 Information Obtained from: Patient Chief Complaint 11/22/2018; patient is here for review of a wound on his left BKA amputation site Electronic Signature(s) Signed: 04/13/2019 3:54:31 PM By: Lenda KelpStone III, Sunday Klos PA-C Entered By: Lenda KelpStone III, Fionnuala Hemmerich on 04/13/2019 15:54:31 -------------------------------------------------------------------------------- Cellular or Tissue Based Product Details Patient Name: Bruce Deleon, Bruce W. 04/13/2019 4:00 Date of Service: PM Medical Record [id number] Number: Patient Account Number: [id number] Date of Birth/Sex: Treating RN: 23-May-1956 (63 y.o. Damaris SchoonerM) Boehlein, Linda Other Clinician: Primary Care Provider: Philip AspenHERNANDEZ ACOSTA, ESTELA Treating Lenda KelpStone III, Jerusalem Brownstein Provider/Extender: Philip AspenHERNANDEZ ACOSTA, Referring Provider: Jacquelin HawkingESTELA Weeks in Treatment: 20 Cellular or Tissue Based Wound #1 Left Amputation Site - Below Knee Product Type Applied to: Performed By: Physician Lenda KelpStone III, Michale Emmerich, PA Cellular or Tissue Based Other Product Type: Level of Consciousness (Pre- Awake and Alert procedure): Pre-procedure Yes - 16:34 Verification/Time Out Taken: Location: trunk / arms / legs Wound Size (sq cm): 0.04 Product Size (sq cm): 10 Waste Size (sq cm): 8 Waste Reason: wound size Amount of Product Applied (sq cm): 2 Instrument Used: Forceps, Scissors Lot #: [id number] Order #: 4 Expiration Date: 08/31/2019 Fenestrated: No Reconstituted: Yes Solution Type: saline Solution Amount: 2 ml Lot #:  [id number] Solution Expiration Date: 11/10/2020 Secured: Yes Secured With: Steri-Strips Dressing Applied: Yes Primary Dressing: gauze Procedural Pain: 0 Post Procedural Pain: 0 Response to Treatment: Procedure was tolerated well Level of Consciousness Awake and Alert (Post-procedure): Post Procedure Diagnosis Same as Pre-procedure Notes [facility] Electronic Signature(s) Signed: 04/13/2019 5:36:13 PM By: Lenda KelpStone III, Pressley Barsky PA-C Signed: 04/13/2019 6:37:17 PM By: Zenaida DeedBoehlein, Linda RN, BSN Entered By: Zenaida DeedBoehlein, Linda on 04/13/2019 16:36:41 -------------------------------------------------------------------------------- HPI Details Patient Name: Bruce Deleon, Bruce W. 04/13/2019 4:00 Date of Service: PM Medical Record [id number] Number: Patient Account Number: [id number] Date of Birth/Sex: Treating RN: 23-May-1956 (63 y.o. Damaris SchoonerM) Boehlein, Linda Other Clinician: Primary Care Provider: Philip AspenHERNANDEZ ACOSTA, ESTELA Treating Lenda KelpStone III, Mindy Behnken Provider/Extender: Philip AspenHERNANDEZ ACOSTA, Referring Provider: Jacquelin HawkingESTELA Weeks in Treatment: 20 History of Present Illness HPI Description: ADMISSION 11/22/2018 [name] is a 63 year old man who developed wounds on his left foot and ankle in late 19 early January. 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 He is followed by Dr. Doneen Poissonhristopher Blackman of orthopedics. The patient is not a diabetic and has no known history of PAD. He has recently started smoking again after having quit for a prolonged period of time. They have been using a rotating prescription of Iodosorb/triple antibiotic/dry dressing on a 3 daily basis. He has had debridements by Dr. 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 He has been using silver collagen to the wound on the left stump of the BKA. Dressing changes every other  day 5/26; left stump wound. Not much change in dimensions. Superior aspect still slightly larger still requiring debridement. We have been using silver collagen 12/29/18 on evaluation today patient actually appears to be doing okay in regard to his leg ulcer at the stump. This section measures a little smaller today although the undermining actually  I think it will be deeper than what was noted previously. He is gonna require some sharp debridement at this point based on what I'm seeing. Fortunately he has no pain although he does have some jumping that occurs with the debridment.. 01/05/19 on evaluation today patient actually appears to be doing a little better in regard to the overall appearance of his wound. He still has some Slough noted in the base of the wound in the region of undermining. Nonetheless he states after last week his pain was much higher than what it previously been noted. That was for about two days. Then things finally come back down again we did actually perform a fairly significant debridement at that point which is probably part of the issue. 01/12/19 On evaluation today patient's wound actually appears to be doing is definitely better at this time with regard to the overall improvement in his amputation site ulcer. The sample has been beneficial for him which is excellent news. Fortunately there's no signs of active infection at this time. No fevers, chills, nausea, or vomiting noted at this time. 01/26/19 on evaluation today patient actually appears to be doing quite well in regard to his wound. He's been tolerating the dressing changes without complication which is excellent news. Fortunately there's no signs of active infection at this time which is also good news. No fevers, chills, nausea, or vomiting noted at this time. 02/02/19 on evaluation today patient actually appears to be doing well in regard to his left blown amputation ulcer. He's been tolerating the dressing  changes without complication. Fortunately there's no signs of active infection at this time. Overall very pleased with the progress and how things seem to have progressed up to this point. With that being said he has been approved for the Snap Vac and has not his out-of-pocket deductible therefore this should be covered for him I think this would be a good option anyway and he's in agreement with proceeding in that regard. 02/09/2019 on evaluation today patient appears to be doing excellent in regard to his left distal below-knee amputation site ulcer. He has been tolerating the dressing changes without complication. Fortunately there is no signs of active infection at this time. No fevers, chills, nausea, vomiting, or diarrhea. He did have some increased pain over last week though it was not in the wound itself but rather in the bone he tells [place]. Fortunately that seems to have resolved he states that sometimes happens he is unsure why. 02/16/2019 on evaluation today patient appears to be doing well with regard to his ulcer. With that being said there is some connective tissue in the base of the wound this is making this much slower than I would like to hope that it could continue to be nonetheless I do believe that the snap VAC is helping to provide additional granulation around the edges of the wound which is great and again I do feel like he is starting to cover over some of the connective tissue in the base of the wound as well which is good to be necessary to get this to heal. 02/23/2019 on evaluation today patient appears to be doing decently well with regard to the distal portion of his left below-knee amputation ulcer. He has been tolerating the dressing changes without complication would been using the snap VAC. With that being said that the wound has gotten a little bit smaller he still have some connective tissue in the base of the wound and unfortunately this  is making it difficult for the  area to completely close and heal. Nonetheless I think we may be able to better manage this with more frequent dressing changes such as collagen with drawtex behind. 03/09/2019 on evaluation today patient appears to be doing well with regard to his below-knee amputation ulcer. Unfortunately this is continuing to give him trouble but again it is mainly due to the fact that he has tendon in the base of the wound that just does not seem to be healing appropriately. Fortunately there is no signs of infection he has been tolerating the dressing changes without complication. No fevers, chills, nausea, vomiting, or diarrhea. There is no evidence of infection at this time the overall size is measuring smaller which is good news. In general9/08/2018 on evaluation today patient actually appears to be showing some signs of improvement with regard to his wound. Fortunately I am very pleased with the progress up to this time. With that being said he still has the tendon noted in the base of the wound this is what is keeping her from completely closing we are still working on that. 03/23/2019 on evaluation today patient continues to have difficulty getting the bottom of the wound bed to close completely. He does have some tendon noted at this site again I wonder try some sharp debridement of this currently he is approved for the Oasis which I am hopeful will help in this regard as well. I plan to apply that today. 03/30/2019 on evaluation today patient appears to be showing signs of the wound appearing about the same as far as the opening although I did see some evidence right in the base of the wound that this is pulling closed little bit more. Again the goal at this point is try to get the area to completely close which is good to be the best news as far as overall improvement is concerned. With that being said it will have a little divot even when it heals as I previously discussed with him. Today is Oasis  application #2. 04/06/2019 on evaluation today patient appears to be doing well with regard to his left amputation site ulcer. There is just a very small opening still remaining in the base of the wound fortunately. There is no signs of infection and overall I do feel like this is closing up I believe the Oasis has been beneficial for him. He is here for application #3 today. 04/13/2019 on evaluation today patient actually appears to be doing quite well with regard to his wound on the left below-knee amputation site. The skin around appears to be doing excellent and the Oasis seems to be doing a great job helping this area to close. It is definitely smaller than last week. This is application #4 today. Electronic Signature(s) Signed: 04/13/2019 4:39:21 PM By: Lenda Kelp PA-C Entered By: Lenda Kelp on 04/13/2019 16:39:21 -------------------------------------------------------------------------------- Physical Exam Details Patient Name: Bruce Deleon, Bruce Deleon 04/13/2019 4:00 Date of Service: PM Medical Record [id number] Number: Patient Account Number: [id number] Date of Birth/Sex: Treating RN: 11-10-1955 (63 y.o. Damaris Schooner Other Clinician: Primary Care Provider: Philip Aspen, ESTELA Treating Lenda Kelp Provider/Extender: Philip Aspen, Referring Provider: Jacquelin Hawking in Treatment: 20 Constitutional Well-nourished and well-hydrated in no acute distress. Respiratory normal breathing without difficulty. clear to auscultation bilaterally. Cardiovascular regular rate and rhythm with normal S1, S2. Psychiatric this patient is able to make decisions and demonstrates good insight into disease process. Alert and Oriented x 3. pleasant and  cooperative. Notes Patient's wound bed currently showed signs of minimal slough noted in the base of the wound that was mechanically debrided away with saline and gauze there is good signs of epithelialization at this time and  overall I am extremely happy with how the patient has progressed. There does not appear to be any signs of active infection at this point. I am very hopeful this is going to close by next week he is very close to this. Electronic Signature(s) Signed: 04/13/2019 4:40:04 PM By: Lenda Kelp PA-C Entered By: Lenda Kelp on 04/13/2019 16:40:03 -------------------------------------------------------------------------------- Physician Orders Details Patient Name: Bruce Manis. 04/13/2019 4:00 Date of Service: PM Medical Record [id number] Number: Patient Account Number: [id number] Date of Birth/Sex: Treating RN: 02/28/1956 (62 y.o. Damaris Schooner Other Clinician: Primary Care Provider: Philip Aspen, ESTELA Treating Lenda Kelp Provider/Extender: Philip Aspen, Referring Provider: Jacquelin Hawking in Treatment: 20 Verbal / Phone Orders: No Diagnosis Coding ICD-10 Coding Code Description Disruption of external operation (surgical) wound, not elsewhere classified, subsequent T81.31XD encounter L97.822 Non-pressure chronic ulcer of other part of left lower leg with fat layer exposed Z89.512 Acquired absence of left leg below knee Follow-up Appointments Return Appointment in 1 week. Dressing Change Frequency Wound #1 Left Amputation Site - Below Knee Change Dressing every other day. - **CHANGE OUTSIDE BANDAGE ONLY IF NEEDED, DUE NOT REMOVE STERI STRIPS OR ADAPTIC** Skin Barriers/Peri-Wound Care Skin Prep Wound Cleansing Wound #1 Left Amputation Site - Below Knee May shower with protection. Primary Wound Dressing Wound #1 Left Amputation Site - Below Knee Skin Substitute Application - Oasis #4 Secondary Dressing Wound #1 Left Amputation Site - Below Knee Dry Gauze - hold in place with netting Edema Control Other: - stump shrinker Additional Orders / Instructions Stop/Decrease Smoking Follow Nutritious Diet - increase protein and vegetables to aid in wound  healing. Electronic Signature(s) Signed: 04/13/2019 5:36:13 PM By: Lenda Kelp PA-C Signed: 04/13/2019 6:37:17 PM By: Zenaida Deed RN, BSN Entered By: Zenaida Deed on 04/13/2019 16:38:10 -------------------------------------------------------------------------------- Problem List Details Patient Name: Bruce Deleon, Bruce Deleon 04/13/2019 4:00 Date of Service: PM Medical Record [id number] Number: Patient Account Number: [id number] Date of Birth/Sex: Treating RN: 07/03/1956 (63 y.o. Damaris Schooner Other Clinician: Philip Aspen, Primary Care Provider: Minerva Ends Treating Lenda Kelp Provider/Extender: Philip Aspen, Referring Provider: Jacquelin Hawking in Treatment: 20 Active Problems ICD-10 Evaluated Encounter Code Description Active Date Today Diagnosis T81.31XD Disruption of external operation (surgical) wound, not 11/22/2018 No Yes elsewhere classified, subsequent encounter L97.822 Non-pressure chronic ulcer of other part of left lower 11/22/2018 No Yes leg with fat layer exposed Z89.512 Acquired absence of left leg below knee 11/22/2018 No Yes Inactive Problems Resolved Problems Electronic Signature(s) Signed: 04/13/2019 3:54:25 PM By: Lenda Kelp PA-C Entered By: Lenda Kelp on 04/13/2019 15:54:25 -------------------------------------------------------------------------------- Progress Note Details Patient Name: Bruce Manis. 04/13/2019 4:00 Date of Service: PM Medical Record [id number] Number: Patient Account Number: [id number] Date of Birth/Sex: Treating RN: May 26, 1956 (63 y.o. Damaris Schooner Other Clinician: Philip Aspen, Primary Care Provider: Minerva Ends Treating Lenda Kelp Provider/Extender: Philip Aspen, Referring Provider: Jacquelin Hawking in Treatment: 20 Subjective Chief Complaint Information obtained from Patient 11/22/2018; patient is here for review of a wound on his left BKA amputation site History of Present Illness  (HPI) ADMISSION 11/22/2018 [name] is a 63 year old man who developed wounds on his left foot and ankle in late 19 early January. 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 He is followed by Dr. Cristal Deer  Magnus Ivan of orthopedics. The patient is not a diabetic and has no known history of PAD. He has recently started smoking again after having quit for a prolonged period of time. They have been using a rotating prescription of Iodosorb/triple antibiotic/dry dressing on a 3 daily basis. He has had debridements by Dr. 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 He has been using silver collagen to the wound on the left stump of the BKA. Dressing changes every other day 5/26; left stump wound. Not much change in dimensions. Superior aspect still slightly larger still requiring debridement. We have been using silver collagen 12/29/18 on evaluation today patient actually appears to be doing okay in regard to his leg ulcer at the stump. This section measures a little smaller today although the undermining actually I think it will be deeper than what was noted previously. He is gonna require some sharp debridement at this point based on what I'm seeing. Fortunately he has no pain although he does have some jumping that occurs with the debridment.. 01/05/19 on evaluation today patient actually appears to be doing a little better in regard to the overall appearance of his wound. He still has some Slough noted in the base of the wound in the region of undermining. Nonetheless he states after last week his pain was much higher than what it previously been noted. That was for about two days. Then things finally come back down again we did actually perform a fairly significant debridement at that point which is probably part of the  issue. 01/12/19 On evaluation today patient's wound actually appears to be doing is definitely better at this time with regard to the overall improvement in his amputation site ulcer. The sample has been beneficial for him which is excellent news. Fortunately there's no signs of active infection at this time. No fevers, chills, nausea, or vomiting noted at this time. 01/26/19 on evaluation today patient actually appears to be doing quite well in regard to his wound. He's been tolerating the dressing changes without complication which is excellent news. Fortunately there's no signs of active infection at this time which is also good news. No fevers, chills, nausea, or vomiting noted at this time. 02/02/19 on evaluation today patient actually appears to be doing well in regard to his left blown amputation ulcer. He's been tolerating the dressing changes without complication. Fortunately there's no signs of active infection at this time. Overall very pleased with the progress and how things seem to have progressed up to this point. With that being said he has been approved for the Snap Vac and has not his out-of-pocket deductible therefore this should be covered for him I think this would be a good option anyway and he's in agreement with proceeding in that regard. 02/09/2019 on evaluation today patient appears to be doing excellent in regard to his left distal below-knee amputation site ulcer. He has been tolerating the dressing changes without complication. Fortunately there is no signs of active infection at this time. No fevers, chills, nausea, vomiting, or diarrhea. He did have some increased pain over last week though it was not in the wound itself but rather in the bone he tells [place]. Fortunately that seems to have resolved he states that sometimes happens he is unsure why. 02/16/2019 on evaluation today patient appears to be doing well with  regard to his ulcer. With that being said there is some  connective tissue in the base of the wound this is making this much slower than I would like to hope that it could continue to be nonetheless I do believe that the snap VAC is helping to provide additional granulation around the edges of the wound which is great and again I do feel like he is starting to cover over some of the connective tissue in the base of the wound as well which is good to be necessary to get this to heal. 02/23/2019 on evaluation today patient appears to be doing decently well with regard to the distal portion of his left below-knee amputation ulcer. He has been tolerating the dressing changes without complication would been using the snap VAC. With that being said that the wound has gotten a little bit smaller he still have some connective tissue in the base of the wound and unfortunately this is making it difficult for the area to completely close and heal. Nonetheless I think we may be able to better manage this with more frequent dressing changes such as collagen with drawtex behind. 03/09/2019 on evaluation today patient appears to be doing well with regard to his below-knee amputation ulcer. Unfortunately this is continuing to give him trouble but again it is mainly due to the fact that he has tendon in the base of the wound that just does not seem to be healing appropriately. Fortunately there is no signs of infection he has been tolerating the dressing changes without complication. No fevers, chills, nausea, vomiting, or diarrhea. There is no evidence of infection at this time the overall size is measuring smaller which is good news. In general9/08/2018 on evaluation today patient actually appears to be showing some signs of improvement with regard to his wound. Fortunately I am very pleased with the progress up to this time. With that being said he still has the tendon noted in the base of the wound this is what is keeping her from completely closing we are still working  on that. 03/23/2019 on evaluation today patient continues to have difficulty getting the bottom of the wound bed to close completely. He does have some tendon noted at this site again I wonder try some sharp debridement of this currently he is approved for the Oasis which I am hopeful will help in this regard as well. I plan to apply that today. 03/30/2019 on evaluation today patient appears to be showing signs of the wound appearing about the same as far as the opening although I did see some evidence right in the base of the wound that this is pulling closed little bit more. Again the goal at this point is try to get the area to completely close which is good to be the best news as far as overall improvement is concerned. With that being said it will have a little divot even when it heals as I previously discussed with him. Today is Oasis application #2. 04/06/2019 on evaluation today patient appears to be doing well with regard to his left amputation site ulcer. There is just a very small opening still remaining in the base of the wound fortunately. There is no signs of infection and overall I do feel like this is closing up I believe the Oasis has been beneficial for him. He is here for application #3 today. 04/13/2019 on evaluation today patient actually appears to be doing quite well with regard to his wound on the  left below-knee amputation site. The skin around appears to be doing excellent and the Oasis seems to be doing a great job helping this area to close. It is definitely smaller than last week. This is application #4 today. Patient History Information obtained from Patient. Family History Cancer - Maternal Grandparents,Paternal Grandparents,Mother, Diabetes - Paternal Grandparents, Heart Disease - Maternal Grandparents,Father, Hypertension - Mother,Father, No family history of Kidney Disease, Lung Disease, Seizures, Stroke, Thyroid Problems, Tuberculosis. Social History Current  every day smoker - 1/2 ppd, Marital Status - Married, Alcohol Use - Never, Drug Use - No History, Caffeine Use - Never. Medical History Eyes Patient has history of Cataracts Denies history of Glaucoma, Optic Neuritis Ear/Nose/Mouth/Throat Denies history of Chronic sinus problems/congestion, Middle ear problems Hematologic/Lymphatic Denies history of Anemia, Hemophilia, Human Immunodeficiency Virus, Lymphedema, Sickle Cell Disease Respiratory Denies history of Aspiration, Asthma, Chronic Obstructive Pulmonary Disease (COPD), Pneumothorax, Sleep Apnea, Tuberculosis Cardiovascular Patient has history of Hypertension Denies history of Angina, Arrhythmia, Congestive Heart Failure, Coronary Artery Disease, Deep Vein Thrombosis, Hypotension, Myocardial Infarction, Peripheral Arterial Disease, Peripheral Venous Disease, Phlebitis, Vasculitis Gastrointestinal Denies history of Cirrhosis , Colitis, Crohnoos, Hepatitis A, Hepatitis B, Hepatitis C Endocrine Denies history of Type I Diabetes, Type II Diabetes Genitourinary Denies history of End Stage Renal Disease Immunological Denies history of Lupus Erythematosus, Raynaudoos, Scleroderma Integumentary (Skin) Denies history of History of Burn Musculoskeletal Denies history of Gout, Rheumatoid Arthritis, Osteoarthritis, Osteomyelitis Neurologic Denies history of Dementia, Neuropathy, Quadriplegia, Paraplegia, Seizure Disorder Oncologic Denies history of Received Chemotherapy, Received Radiation Psychiatric Denies history of Anorexia/bulimia, Confinement Anxiety Hospitalization/Surgery History - [facility]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 - 20 years ago spinal surgery cervical x4 plates. Medical And Surgical History Notes Immunological ALS Review of Systems (ROS) Constitutional Symptoms (General Health) Denies complaints or symptoms of Fatigue, Fever, Chills, Marked Weight Change. Respiratory Denies complaints or symptoms of Chronic or frequent  coughs, Shortness of Breath. Cardiovascular Denies complaints or symptoms of Chest pain. Psychiatric Denies complaints or symptoms of Claustrophobia, Suicidal. Objective Constitutional Well-nourished and well-hydrated in no acute distress. Vitals Time Taken: 4:06 PM, Height: 72 in, Weight: 130 lbs, BMI: 17.6, Temperature: 98.5 F, Pulse: 84 bpm, Respiratory Rate: 18 breaths/min, Blood Pressure: 156/82 mmHg. Respiratory normal breathing without difficulty. clear to auscultation bilaterally. Cardiovascular regular rate and rhythm with normal S1, S2. Psychiatric this patient is able to make decisions and demonstrates good insight into disease process. Alert and Oriented x 3. pleasant and cooperative. General Notes: Patient's wound bed currently showed signs of minimal slough noted in the base of the wound that was mechanically debrided away with saline and gauze there is good signs of epithelialization at this time and overall I am extremely happy with how the patient has progressed. There does not appear to be any signs of active infection at this point. I am very hopeful this is going to close by next week he is very close to this. Integumentary (Hair, Skin) Wound #1 status is Open. Original cause of wound was Surgical Injury. The wound is located on the Left Amputation Site - Below Knee. The wound measures 0.2cm length x 0.2cm width x 0.1cm depth; 0.031cm^2 area and 0.003cm^3 volume. There is Fat Layer (Subcutaneous Tissue) Exposed exposed. There is no tunneling or undermining noted. There is a small amount of serosanguineous drainage noted. The wound margin is thickened. There is large (67-100%) pink granulation within the wound bed. There is no necrotic tissue within the wound bed. 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 A skin graft procedure using a bioengineered skin substitute/cellular or tissue based product was performed by Worthy Keeler, PA with the following instrument(s): Forceps and Scissors. Other was applied and secured with Steri-Strips. 2 sq cm of product was utilized and 8 sq cm was wasted due to wound size. Post Application, gauze was applied. A Time Out was conducted at 16:34, prior to the start of the procedure. The procedure was tolerated well with a pain level of 0 throughout and a pain level of 0 following the procedure. Post procedure Diagnosis Wound #1: Same as Pre-Procedure General Notes: oasis burn. Plan Follow-up Appointments: Return Appointment in 1 week. Dressing Change Frequency: Wound #1 Left Amputation Site - Below Knee: Change Dressing every other day. - **CHANGE OUTSIDE BANDAGE ONLY IF NEEDED, DUE NOT REMOVE STERI STRIPS OR ADAPTIC** Skin Barriers/Peri-Wound Care: Skin Prep Wound Cleansing: Wound #1 Left Amputation Site - Below Knee: May shower with protection. Primary Wound Dressing: Wound #1 Left Amputation Site - Below Knee: Skin Substitute Application - Oasis #4 Secondary Dressing: Wound #1 Left Amputation Site - Below Knee: Dry Gauze - hold in place with netting Edema Control: Other: - stump shrinker Additional Orders / Instructions: Stop/Decrease Smoking Follow Nutritious Diet - increase protein and vegetables to aid in wound healing. 1. I would recommend that we continue with the Oasis as we have been doing I did apply this today and then packed and behind with a dry gauze followed by Steri-Strips. There was just one Steri-Strip used to secure this. This is what we have done the past few weeks and the skin around seems to be doing great he seems to have an allergy to some  of the adhesives we used on the Adaptic and other bandaging. 2. I really do not think the patient needs to change the dressing unless he has any complications otherwise we will just leave this in place until he comes back. We will see patient back for reevaluation in 1 week here in the clinic. If anything worsens or changes patient will contact our office for additional recommendations. Electronic Signature(s) Signed: 04/15/2019 9:36:09 AM By: Worthy Keeler PA-C Signed: 04/18/2019 6:17:12 PM By: Levan Hurst RN, BSN Previous Signature: 04/13/2019 4:40:50 PM Version By: Worthy Keeler PA-C Entered By: Levan Hurst on 04/15/2019 09:31:35 -------------------------------------------------------------------------------- HxROS Details Patient Name: Bruce Deleon. 04/13/2019 4:00 Date of Service: PM Medical Record [id number] Number: Patient Account Number: [id number] Date of Birth/Sex: Treating RN: 1956-06-16 (63 y.o. Ernestene Mention Other Clinician: Primary Care Provider: Isaac Bliss, ESTELA Treating Worthy Keeler Provider/Extender: Isaac Bliss, Referring Provider: Magdalen Spatz in Treatment: 20 Information Obtained From Patient Constitutional Symptoms (General Health) Complaints and Symptoms: Negative for: Fatigue; Fever; Chills; Marked Weight Change Respiratory Complaints and Symptoms: Negative for: Chronic or frequent coughs; Shortness of Breath Medical History: Negative for: Aspiration; Asthma; Chronic Obstructive Pulmonary Disease (COPD); Pneumothorax; Sleep Apnea; Tuberculosis Cardiovascular Complaints and Symptoms: Negative for: Chest pain Medical History: Positive for: Hypertension Negative for: Angina; Arrhythmia; Congestive Heart Failure; Coronary Artery Disease; Deep Vein Thrombosis; Hypotension; Myocardial Infarction; Peripheral Arterial Disease; Peripheral Venous Disease; Phlebitis; Vasculitis Psychiatric Complaints and Symptoms: Negative for:  Claustrophobia; Suicidal Medical History: Negative for: Anorexia/bulimia; Confinement Anxiety  Eyes Medical History: Positive for: Cataracts Negative for: Glaucoma; Optic Neuritis Ear/Nose/Mouth/Throat Medical History: Negative for: Chronic sinus problems/congestion; Middle ear problems Hematologic/Lymphatic Medical History: Negative for: Anemia; Hemophilia; Human Immunodeficiency Virus; Lymphedema; Sickle Cell Disease Gastrointestinal Medical History: Negative for: Cirrhosis ; Colitis; Crohns; Hepatitis A; Hepatitis B; Hepatitis C Endocrine Medical History: Negative for: Type I Diabetes; Type II Diabetes Genitourinary Medical History: Negative for: End Stage Renal Disease Immunological Medical History: Negative for: Lupus Erythematosus; Raynauds; Scleroderma Past Medical History Notes: ALS Integumentary (Skin) Medical History: Negative for: History of Burn Musculoskeletal Medical History: Negative for: Gout; Rheumatoid Arthritis; Osteoarthritis; Osteomyelitis Neurologic Medical History: Negative for: Dementia; Neuropathy; Quadriplegia; Paraplegia; Seizure Disorder Oncologic Medical History: Negative for: Received Chemotherapy; Received Radiation HBO Extended History Items Eyes: Cataracts Immunizations Pneumococcal Vaccine: Received Pneumococcal Vaccination: No Implantable Devices None Hospitalization / Surgery History Type of Hospitalization/Surgery [facility]- Left BKA 20 years ago spinal surgery cervical x4 plates Family and Social History Cancer: Yes - Maternal Grandparents,Paternal Grandparents,Mother; Diabetes: Yes - Paternal Grandparents; Heart Disease: Yes - Maternal Grandparents,Father; Hypertension: Yes - Mother,Father; Kidney Disease: No; Lung Disease: No; Seizures: No; Stroke: No; Thyroid Problems: No; Tuberculosis: No; Current every day smoker - 1/2 ppd; Marital Status - Married; Alcohol Use: Never; Drug Use: No History; Caffeine Use:  Never; Financial Concerns: No; Food, Clothing or Shelter Needs: No; Support System Lacking: No; Transportation Concerns: No Physician Affirmation I have reviewed and agree with the above information. Electronic Signature(s) Signed: 04/13/2019 5:36:13 PM By: Lenda Kelp PA-C Signed: 04/13/2019 6:37:17 PM By: Zenaida Deed RN, BSN Entered By: Lenda Kelp on 04/13/2019 16:39:37 -------------------------------------------------------------------------------- SuperBill Details Patient Name: Date of Service: Bruce Manis 04/13/2019 Medical Record Number:[id number] Patient Account Number: [id number] Date of Birth/Sex: Treating RN: 05/10/1956 (63 y.o. Damaris Schooner Primary Care Provider: Chaya Jan Other Clinician: Referring Provider: Treating Provider/Extender:Stone III, Vladimir Crofts, ESTELA Weeks in Treatment: 20 Diagnosis Coding ICD-10 Codes Code Description Disruption of external operation (surgical) wound, not elsewhere classified, subsequent T81.31XD encounter [phone] Non-pressure chronic ulcer of other part of left lower leg with fat layer exposed Z89.512 Acquired absence of left leg below knee Facility Procedures CPT4: Description Modifier Quantity Code [id number] - SKIN SUB GRAFT TRNK/ARM/LEG 1 ICD-10 Diagnosis Description L97.822 Non-pressure chronic ulcer of other part of left lower leg with fat layer exposed CPT4: [id number] - Dermal substitute tissue/non-human origin w metabolically active 10 elements- Oasis (Burn Matrix)product applied per sq cm (Facility Only) Physician Procedures CPT4 Code Description: [id number] - WC PHYS SKIN SUB GRAFT TRNK/ARM/LEG ICD-10 Diagnosis Description L97.822 Non-pressure chronic ulcer of other part of left lower leg Modifier: with fat layer Quantity: 1 exposed Electronic Signature(s) Signed: 04/13/2019 4:40:58 PM By: Lenda Kelp PA-C Entered By: Lenda Kelp on 04/13/2019 16:40:58

## 2019-04-18 NOTE — Telephone Encounter (Signed)
Drug screen was incomplete. He was not positive for alcohol, but the screen was not ordered for the drug toxicology. It would need to be repeated if narcotic screen is desired.

## 2019-04-20 ENCOUNTER — Encounter (HOSPITAL_BASED_OUTPATIENT_CLINIC_OR_DEPARTMENT_OTHER): Payer: Medicare Other | Attending: Physician Assistant | Admitting: Physician Assistant

## 2019-04-20 ENCOUNTER — Other Ambulatory Visit: Payer: Self-pay

## 2019-04-20 DIAGNOSIS — F17210 Nicotine dependence, cigarettes, uncomplicated: Secondary | ICD-10-CM | POA: Insufficient documentation

## 2019-04-20 DIAGNOSIS — L97822 Non-pressure chronic ulcer of other part of left lower leg with fat layer exposed: Secondary | ICD-10-CM | POA: Insufficient documentation

## 2019-04-20 DIAGNOSIS — Z89512 Acquired absence of left leg below knee: Secondary | ICD-10-CM | POA: Diagnosis not present

## 2019-04-20 DIAGNOSIS — I10 Essential (primary) hypertension: Secondary | ICD-10-CM | POA: Diagnosis not present

## 2019-04-20 NOTE — Progress Notes (Addendum)
Bruce, Deleon ([id number]) Visit Report for 04/20/2019 Chief Complaint Document Details Patient Name: Bruce Deleon, Bruce Deleon 04/20/2019 3:45 Date of Service: PM Medical Record [id number] Number: Patient Account Number: [id number] Date of Birth/Sex: Treating RN: 1955-07-15 (63 y.o. Bruce Deleon Other Clinician: Meryl Dare Primary Care Provider: Minerva Ends Treating Lenda Kelp Provider/Extender: Philip Aspen, Referring Provider: Jacquelin Hawking in Treatment: 21 Information Obtained from: Patient Chief Complaint 11/22/2018; patient is here for review of a wound on his left BKA amputation site Electronic Signature(s) Signed: 04/20/2019 4:07:00 PM By: Lenda Kelp PA-C Entered By: Lenda Kelp on 04/20/2019 16:07:00 -------------------------------------------------------------------------------- HPI Details Patient Name: Bruce Deleon. 04/20/2019 3:45 Date of Service: PM Medical Record [id number] Number: Patient Account Number: [id number] Date of Birth/Sex: Treating RN: 1955/08/06 (63 y.o. Bruce Deleon Other Clinician: Primary Care Provider: Virgilio Belling, Destiny ESTELA Treating Lenda Kelp Provider/Extender: Philip Aspen, Referring Provider: Jacquelin Hawking in Treatment: 21 History of Present Illness HPI Description: ADMISSION 11/22/2018 Bruce Deleon is a 63 year old man who developed wounds on his left foot and ankle in late 19 early January. 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 He is followed by Dr. Doneen Poisson of orthopedics. The patient is not a diabetic and has no known history of PAD. He has recently started smoking again after having quit for a prolonged period of time. They have been using a rotating prescription of Iodosorb/triple antibiotic/dry dressing on a 3 daily basis. He has had debridements by Dr. 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 He has been using silver collagen to the wound on the left stump of the BKA. Dressing changes every other day 5/26; left stump wound. Not much change in dimensions. Superior aspect still slightly larger still requiring debridement. We have been using silver collagen 12/29/18 on evaluation today patient actually appears to be doing okay in regard to his leg ulcer at the stump. This section measures a little smaller today although the undermining actually I think it will be deeper than what was noted previously. He is gonna require some sharp debridement at this point based on what I'm seeing. Fortunately he has no pain although he does have some jumping that occurs with the debridment.. 01/05/19 on evaluation today patient actually appears to be doing a little better in regard to the overall appearance of his wound. He still has some Slough noted in the base of the wound in the region of undermining. Nonetheless he states after last week his pain was much higher than what it previously been noted. That was for about two days. Then things finally come back down again we did actually perform a fairly significant debridement at that point which is probably part of the issue. 01/12/19 On evaluation today patient's wound actually appears to be doing is definitely better at this time with regard to the overall improvement in his amputation site ulcer. The sample has been beneficial for him which is excellent news. Fortunately there's no signs of active infection at this time. No fevers, chills, nausea, or vomiting noted at this time. 01/26/19 on evaluation today patient actually appears to be doing quite well in regard to his wound. He's been tolerating the dressing changes without complication which is excellent news. Fortunately there's no signs of active infection at this time  which is also good  news. No fevers, chills, nausea, or vomiting noted at this time. 02/02/19 on evaluation today patient actually appears to be doing well in regard to his left blown amputation ulcer. He's been tolerating the dressing changes without complication. Fortunately there's no signs of active infection at this time. Overall very pleased with the progress and how things seem to have progressed up to this point. With that being said he has been approved for the Snap Vac and has not his out-of-pocket deductible therefore this should be covered for him I think this would be a good option anyway and he's in agreement with proceeding in that regard. 02/09/2019 on evaluation today patient appears to be doing excellent in regard to his left distal below-knee amputation site ulcer. He has been tolerating the dressing changes without complication. Fortunately there is no signs of active infection at this time. No fevers, chills, nausea, vomiting, or diarrhea. He did have some increased pain over last week though it was not in the wound itself but rather in the bone he tells [place]. Fortunately that seems to have resolved he states that sometimes happens he is unsure why. 02/16/2019 on evaluation today patient appears to be doing well with regard to his ulcer. With that being said there is some connective tissue in the base of the wound this is making this much slower than I would like to hope that it could continue to be nonetheless I do believe that the snap VAC is helping to provide additional granulation around the edges of the wound which is great and again I do feel like he is starting to cover over some of the connective tissue in the base of the wound as well which is good to be necessary to get this to heal. 02/23/2019 on evaluation today patient appears to be doing decently well with regard to the distal portion of his left below-knee amputation ulcer. He has been tolerating the dressing changes  without complication would been using the snap VAC. With that being said that the wound has gotten a little bit smaller he still have some connective tissue in the base of the wound and unfortunately this is making it difficult for the area to completely close and heal. Nonetheless I think we may be able to better manage this with more frequent dressing changes such as collagen with drawtex behind. 03/09/2019 on evaluation today patient appears to be doing well with regard to his below-knee amputation ulcer. Unfortunately this is continuing to give him trouble but again it is mainly due to the fact that he has tendon in the base of the wound that just does not seem to be healing appropriately. Fortunately there is no signs of infection he has been tolerating the dressing changes without complication. No fevers, chills, nausea, vomiting, or diarrhea. There is no evidence of infection at this time the overall size is measuring smaller which is good news. In general9/08/2018 on evaluation today patient actually appears to be showing some signs of improvement with regard to his wound. Fortunately I am very pleased with the progress up to this time. With that being said he still has the tendon noted in the base of the wound this is what is keeping her from completely closing we are still working on that. 03/23/2019 on evaluation today patient continues to have difficulty getting the bottom of the wound bed to close completely. He does have some tendon noted at this site again I wonder try some sharp debridement of this currently he  is approved for the Oasis which I am hopeful will help in this regard as well. I plan to apply that today. 03/30/2019 on evaluation today patient appears to be showing signs of the wound appearing about the same as far as the opening although I did see some evidence right in the base of the wound that this is pulling closed little bit more. Again the goal at this point is try to  get the area to completely close which is good to be the best news as far as overall improvement is concerned. With that being said it will have a little divot even when it heals as I previously discussed with him. Today is Oasis application #2. 04/06/2019 on evaluation today patient appears to be doing well with regard to his left amputation site ulcer. There is just a very small opening still remaining in the base of the wound fortunately. There is no signs of infection and overall I do feel like this is closing up I believe the Oasis has been beneficial for him. He is here for application #3 today. 04/13/2019 on evaluation today patient actually appears to be doing quite well with regard to his wound on the left below-knee amputation site. The skin around appears to be doing excellent and the Oasis seems to be doing a great job helping this area to close. It is definitely smaller than last week. This is application #4 today. 04/20/2019 on evaluation today patient appears to be doing about the same as last week with regard to his wound. The small wound opening has not really close much more compared to what we saw last week unfortunately I think this is just going to take some time. I do not see any need for the Oasis at this point I think it has done its job up as far as it cannot this time is just can be a matter of letting this area slowly but hopefully surely close of its own accord. Electronic Signature(s) Signed: 04/20/2019 6:22:00 PM By: Lenda Kelp PA-C Entered By: Lenda Kelp on 04/20/2019 18:22:00 -------------------------------------------------------------------------------- Physical Exam Details Patient Name: CAEDEN, FOOTS 04/20/2019 3:45 Date of Service: PM Medical Record [id number] Number: Patient Account Number: [id number] Date of Birth/Sex: Treating RN: 1955-07-18 (63 y.o. Bruce Deleon Other Clinician: Primary Care Provider: Virgilio Belling,  Destiny ESTELA Treating Lenda Kelp Provider/Extender: Philip Aspen, Referring Provider: Jacquelin Hawking in Treatment: 21 Constitutional Well-nourished and well-hydrated in no acute distress. Respiratory normal breathing without difficulty. clear to auscultation bilaterally. Cardiovascular regular rate and rhythm with normal S1, S2. Psychiatric this patient is able to make decisions and demonstrates good insight into disease process. Alert and Oriented x 3. pleasant and cooperative. Notes I do think that we can use a collagen based dressing which will hopefully promote and help things to move along. I do not think we really need to pack anything into the wound I think that she is can end up keeping it open more at this point. No sharp debridement was necessary Electronic Signature(s) Signed: 04/20/2019 6:22:43 PM By: Lenda Kelp PA-C Entered By: Lenda Kelp on 04/20/2019 18:22:43 -------------------------------------------------------------------------------- Physician Orders Details Patient Name: Bruce Deleon. 04/20/2019 3:45 Date of Service: PM Medical Record [id number] Number: Patient Account Number: [id number] Date of Birth/Sex: Treating RN: 1956-01-03 (63 y.o. Bruce Deleon Other Clinician: Meryl Dare Primary Care Provider: Minerva Ends Treating Lenda Kelp Provider/Extender: Philip Aspen, Referring Provider: Jacquelin Hawking in Treatment: 21 Verbal /  Phone Orders: No Diagnosis Coding ICD-10 Coding Code Description Disruption of external operation (surgical) wound, not elsewhere classified, subsequent T81.31XD encounter L97.822 Non-pressure chronic ulcer of other part of left lower leg with fat layer exposed Z89.512 Acquired absence of left leg below knee Follow-up Appointments Return Appointment in 1 week. Dressing Change Frequency Wound #1 Left Amputation Site - Below Knee Change dressing three times week. Wound  Cleansing Wound #1 Left Amputation Site - Below Knee Clean wound with Normal Saline. - gently wipe out with qtip Primary Wound Dressing Wound #1 Left Amputation Site - Below Knee Silver Collagen Secondary Dressing Wound #1 Left Amputation Site - Below Knee Dry Gauze - hold in place with netting Edema Control Other: - stump shrinker Additional Orders / Instructions Stop/Decrease Smoking Follow Nutritious Diet - increase protein and vegetables to aid in wound healing. Electronic Signature(s) Signed: 04/21/2019 12:09:12 PM By: Zenaida DeedBoehlein, Linda RN, BSN Signed: 04/24/2019 10:37:03 PM By: Lenda KelpStone III, Hoyt PA-C Entered By: Zenaida DeedBoehlein, Linda on 04/20/2019 17:19:07 -------------------------------------------------------------------------------- Problem List Details Patient Name: Bruce ManisHAISLIP, Bennet W. 04/20/2019 3:45 Date of Service: PM Medical Record [id number] Number: Patient Account Number: [id number] Date of Birth/Sex: Treating RN: [birth year] (63 y.o. Bruce SchoonerM) Boehlein, Linda Other Clinician: Meryl DareHERNANDEZ ACOSTA, Dawkins, Destiny Primary Care Provider: Minerva EndsESTELA Treating Lenda KelpStone III, Hoyt Provider/Extender: Philip AspenHERNANDEZ ACOSTA, Referring Provider: Jacquelin HawkingESTELA Weeks in Treatment: 21 Active Problems ICD-10 Evaluated Encounter Code Description Active Date Today Diagnosis T81.31XD Disruption of external operation (surgical) wound, not 11/22/2018 No Yes elsewhere classified, subsequent encounter L97.822 Non-pressure chronic ulcer of other part of left lower 11/22/2018 No Yes leg with fat layer exposed Z89.512 Acquired absence of left leg below knee 11/22/2018 No Yes Inactive Problems Resolved Problems Electronic Signature(s) Signed: 04/20/2019 4:06:55 PM By: Lenda KelpStone III, Hoyt PA-C Entered By: Lenda KelpStone III, Hoyt on 04/20/2019 16:06:54 -------------------------------------------------------------------------------- Progress Note Details Patient Name: Bruce ManisHAISLIP, Fue W. 04/20/2019 3:45 Date of Service: PM Medical  Record [id number] Number: Patient Account Number: [id number] Date of Birth/Sex: Treating RN: [birth year] (63 y.o. Bruce SchoonerM) Boehlein, Linda Other Clinician: Meryl DareHERNANDEZ ACOSTA, Dawkins, Destiny Primary Care Provider: Minerva EndsESTELA Treating Lenda KelpStone III, Hoyt Provider/Extender: Philip AspenHERNANDEZ ACOSTA, Referring Provider: Jacquelin HawkingESTELA Weeks in Treatment: 21 Subjective Chief Complaint Information obtained from Patient 11/22/2018; patient is here for review of a wound on his left BKA amputation site History of Present Illness (HPI) ADMISSION 11/22/2018 Bruce Deleon is a 63 year old man who developed wounds on his left foot and ankle in late 19 early January. 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 He is followed by Dr. Doneen Poissonhristopher Blackman of orthopedics. The patient is not a diabetic and has no known history of PAD. He has recently started smoking again after having quit for a prolonged period of time. They have been using a rotating prescription of Iodosorb/triple antibiotic/dry dressing on a 3 daily basis. He has had debridements by Dr. 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 He has been using silver collagen to the wound on the left stump of the BKA. Dressing changes every other day 5/26; left stump wound. Not much change in dimensions. Superior aspect still slightly larger still requiring debridement. We have been using silver collagen 12/29/18 on evaluation today patient actually appears to be doing okay in regard to his leg ulcer at the stump. This section measures a little smaller today although the undermining actually I think it will be deeper than what  was noted previously. He is gonna require some sharp debridement at this point based on what I'm seeing. Fortunately he has no pain although he does have some jumping that occurs  with the debridment.. 01/05/19 on evaluation today patient actually appears to be doing a little better in regard to the overall appearance of his wound. He still has some Slough noted in the base of the wound in the region of undermining. Nonetheless he states after last week his pain was much higher than what it previously been noted. That was for about two days. Then things finally come back down again we did actually perform a fairly significant debridement at that point which is probably part of the issue. 01/12/19 On evaluation today patient's wound actually appears to be doing is definitely better at this time with regard to the overall improvement in his amputation site ulcer. The sample has been beneficial for him which is excellent news. Fortunately there's no signs of active infection at this time. No fevers, chills, nausea, or vomiting noted at this time. 01/26/19 on evaluation today patient actually appears to be doing quite well in regard to his wound. He's been tolerating the dressing changes without complication which is excellent news. Fortunately there's no signs of active infection at this time which is also good news. No fevers, chills, nausea, or vomiting noted at this time. 02/02/19 on evaluation today patient actually appears to be doing well in regard to his left blown amputation ulcer. He's been tolerating the dressing changes without complication. Fortunately there's no signs of active infection at this time. Overall very pleased with the progress and how things seem to have progressed up to this point. With that being said he has been approved for the Snap Vac and has not his out-of-pocket deductible therefore this should be covered for him I think this would be a good option anyway and he's in agreement with proceeding in that regard. 02/09/2019 on evaluation today patient appears to be doing excellent in regard to his left distal below-knee amputation site ulcer. He has been  tolerating the dressing changes without complication. Fortunately there is no signs of active infection at this time. No fevers, chills, nausea, vomiting, or diarrhea. He did have some increased pain over last week though it was not in the wound itself but rather in the bone he tells [place]. Fortunately that seems to have resolved he states that sometimes happens he is unsure why. 02/16/2019 on evaluation today patient appears to be doing well with regard to his ulcer. With that being said there is some connective tissue in the base of the wound this is making this much slower than I would like to hope that it could continue to be nonetheless I do believe that the snap VAC is helping to provide additional granulation around the edges of the wound which is great and again I do feel like he is starting to cover over some of the connective tissue in the base of the wound as well which is good to be necessary to get this to heal. 02/23/2019 on evaluation today patient appears to be doing decently well with regard to the distal portion of his left below-knee amputation ulcer. He has been tolerating the dressing changes without complication would been using the snap VAC. With that being said that the wound has gotten a little bit smaller he still have some connective tissue in the base of the wound and unfortunately this is making it difficult for the area  to completely close and heal. Nonetheless I think we may be able to better manage this with more frequent dressing changes such as collagen with drawtex behind. 03/09/2019 on evaluation today patient appears to be doing well with regard to his below-knee amputation ulcer. Unfortunately this is continuing to give him trouble but again it is mainly due to the fact that he has tendon in the base of the wound that just does not seem to be healing appropriately. Fortunately there is no signs of infection he has been tolerating the dressing changes without  complication. No fevers, chills, nausea, vomiting, or diarrhea. There is no evidence of infection at this time the overall size is measuring smaller which is good news. In general9/08/2018 on evaluation today patient actually appears to be showing some signs of improvement with regard to his wound. Fortunately I am very pleased with the progress up to this time. With that being said he still has the tendon noted in the base of the wound this is what is keeping her from completely closing we are still working on that. 03/23/2019 on evaluation today patient continues to have difficulty getting the bottom of the wound bed to close completely. He does have some tendon noted at this site again I wonder try some sharp debridement of this currently he is approved for the Oasis which I am hopeful will help in this regard as well. I plan to apply that today. 03/30/2019 on evaluation today patient appears to be showing signs of the wound appearing about the same as far as the opening although I did see some evidence right in the base of the wound that this is pulling closed little bit more. Again the goal at this point is try to get the area to completely close which is good to be the best news as far as overall improvement is concerned. With that being said it will have a little divot even when it heals as I previously discussed with him. Today is Oasis application #2. 04/06/2019 on evaluation today patient appears to be doing well with regard to his left amputation site ulcer. There is just a very small opening still remaining in the base of the wound fortunately. There is no signs of infection and overall I do feel like this is closing up I believe the Oasis has been beneficial for him. He is here for application #3 today. 04/13/2019 on evaluation today patient actually appears to be doing quite well with regard to his wound on the left below-knee amputation site. The skin around appears to be doing excellent  and the Oasis seems to be doing a great job helping this area to close. It is definitely smaller than last week. This is application #4 today. 04/20/2019 on evaluation today patient appears to be doing about the same as last week with regard to his wound. The small wound opening has not really close much more compared to what we saw last week unfortunately I think this is just going to take some time. I do not see any need for the Oasis at this point I think it has done its job up as far as it cannot this time is just can be a matter of letting this area slowly but hopefully surely close of its own accord. Patient History Information obtained from Patient. Family History Cancer - Maternal Grandparents,Paternal Grandparents,Mother, Diabetes - Paternal Grandparents, Heart Disease - Maternal Grandparents,Father, Hypertension - Mother,Father, No family history of Kidney Disease, Lung Disease, Seizures, Stroke, Thyroid  Problems, Tuberculosis. Social History Current every day smoker - 1/2 ppd, Marital Status - Married, Alcohol Use - Never, Drug Use - No History, Caffeine Use - Never. Medical History Eyes Patient has history of Cataracts Denies history of Glaucoma, Optic Neuritis Ear/Nose/Mouth/Throat Denies history of Chronic sinus problems/congestion, Middle ear problems Hematologic/Lymphatic Denies history of Anemia, Hemophilia, Human Immunodeficiency Virus, Lymphedema, Sickle Cell Disease Respiratory Denies history of Aspiration, Asthma, Chronic Obstructive Pulmonary Disease (COPD), Pneumothorax, Sleep Apnea, Tuberculosis Cardiovascular Patient has history of Hypertension Denies history of Angina, Arrhythmia, Congestive Heart Failure, Coronary Artery Disease, Deep Vein Thrombosis, Hypotension, Myocardial Infarction, Peripheral Arterial Disease, Peripheral Venous Disease, Phlebitis, Vasculitis Gastrointestinal Denies history of Cirrhosis , Colitis, Crohnoos, Hepatitis A, Hepatitis B,  Hepatitis C Endocrine Denies history of Type I Diabetes, Type II Diabetes Genitourinary Denies history of End Stage Renal Disease Immunological Denies history of Lupus Erythematosus, Raynaudoos, Scleroderma Integumentary (Skin) Denies history of History of Burn Musculoskeletal Denies history of Gout, Rheumatoid Arthritis, Osteoarthritis, Osteomyelitis Neurologic Denies history of Dementia, Neuropathy, Quadriplegia, Paraplegia, Seizure Disorder Oncologic Denies history of Received Chemotherapy, Received Radiation Psychiatric Denies history of Anorexia/bulimia, Confinement Anxiety Hospitalization/Surgery History - [facility]- Left BKA. - 20 years ago spinal surgery cervical x4 plates. Medical And Surgical History Notes Immunological ALS Review of Systems (ROS) Constitutional Symptoms (General Health) Denies complaints or symptoms of Fatigue, Fever, Chills, Marked Weight Change. Respiratory Denies complaints or symptoms of Chronic or frequent coughs, Shortness of Breath. Cardiovascular Denies complaints or symptoms of Chest pain. Psychiatric Denies complaints or symptoms of Claustrophobia, Suicidal. Objective Constitutional Well-nourished and well-hydrated in no acute distress. Vitals Time Taken: 4:33 PM, Height: 72 in, Weight: 130 lbs, BMI: 17.6, Temperature: 98.3 F, Pulse: 81 bpm, Respiratory Rate: 18 breaths/min, Blood Pressure: 142/78 mmHg. Respiratory normal breathing without difficulty. clear to auscultation bilaterally. Cardiovascular regular rate and rhythm with normal S1, S2. Psychiatric this patient is able to make decisions and demonstrates good insight into disease process. Alert and Oriented x 3. pleasant and cooperative. General Notes: I do think that we can use a collagen based dressing which will hopefully promote and help things to move along. I do not think we really need to pack anything into the wound I think that she is can end up keeping  it open more at this point. No sharp debridement was necessary Integumentary (Hair, Skin) Wound #1 status is Open. Original cause of wound was Surgical Injury. The wound is located on the Left Amputation Site - Below Knee. The wound measures 0.1cm length x 0.1cm width x 0.1cm depth; 0.008cm^2 area and 0.001cm^3 volume. There is Fat Layer (Subcutaneous Tissue) Exposed exposed. There is no tunneling or undermining noted. There is a small amount of serosanguineous drainage noted. The wound margin is thickened. There is large (67-100%) pink granulation within the wound bed. There is no necrotic tissue within the wound bed. 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 Dressing Change Frequency: Wound #1 Left Amputation Site - Below Knee: Change dressing three times week. Wound Cleansing: Wound #1 Left Amputation Site - Below Knee: Clean wound with Normal Saline. - gently wipe out with qtip Primary Wound Dressing: Wound #1 Left Amputation Site - Below Knee: Silver Collagen Secondary Dressing: Wound #1 Left Amputation Site - Below Knee: Dry Gauze - hold  in place with netting Edema Control: Other: - stump shrinker Additional Orders / Instructions: Stop/Decrease Smoking Follow Nutritious Diet - increase protein and vegetables to aid in wound healing. 1. My suggestion at this time is good to be that we initiate treatment with a silver collagen dressing I think this is good to be most appropriate for the patient he is in agreement the plan. 2. I am also going to suggest that we go ahead and continue to see him on a weekly basis just to monitor how things are doing. I really feel like this is a good chance still at healing  although she is good to be somewhat slow to be perfectly honest. 3. I do think he can continue to clean the area with saline which is perfectly fine using a Q-tip. We will see patient back for reevaluation in 1 week here in the clinic. If anything worsens or changes patient will contact our office for additional recommendations. Electronic Signature(s) Signed: 04/20/2019 6:23:30 PM By: Lenda Kelp PA-C Entered By: Lenda Kelp on 04/20/2019 18:23:30 -------------------------------------------------------------------------------- HxROS Details Patient Name: Bruce Deleon. 04/20/2019 3:45 Date of Service: PM Medical Record [id number] Number: Patient Account Number: [id number] Date of Birth/Sex: Treating RN: 1955-10-26 (62 y.o. Bruce Deleon Other Clinician: Meryl Dare Primary Care Provider: Minerva Ends Treating Lenda Kelp Provider/Extender: Philip Aspen, Referring Provider: Jacquelin Hawking in Treatment: 21 Information Obtained From Patient Constitutional Symptoms (General Health) Complaints and Symptoms: Negative for: Fatigue; Fever; Chills; Marked Weight Change Respiratory Complaints and Symptoms: Negative for: Chronic or frequent coughs; Shortness of Breath Medical History: Negative for: Aspiration; Asthma; Chronic Obstructive Pulmonary Disease (COPD); Pneumothorax; Sleep Apnea; Tuberculosis Cardiovascular Complaints and Symptoms: Negative for: Chest pain Medical History: Positive for: Hypertension Negative for: Angina; Arrhythmia; Congestive Heart Failure; Coronary Artery Disease; Deep Vein Thrombosis; Hypotension; Myocardial Infarction; Peripheral Arterial Disease; Peripheral Venous Disease; Phlebitis; Vasculitis Psychiatric Complaints and Symptoms: Negative for: Claustrophobia; Suicidal Medical History: Negative for: Anorexia/bulimia; Confinement Anxiety Eyes Medical History: Positive for: Cataracts Negative for: Glaucoma;  Optic Neuritis Ear/Nose/Mouth/Throat Medical History: Negative for: Chronic sinus problems/congestion; Middle ear problems Hematologic/Lymphatic Medical History: Negative for: Anemia; Hemophilia; Human Immunodeficiency Virus; Lymphedema; Sickle Cell Disease Gastrointestinal Medical History: Negative for: Cirrhosis ; Colitis; Crohns; Hepatitis A; Hepatitis B; Hepatitis C Endocrine Medical History: Negative for: Type I Diabetes; Type II Diabetes Genitourinary Medical History: Negative for: End Stage Renal Disease Immunological Medical History: Negative for: Lupus Erythematosus; Raynauds; Scleroderma Past Medical History Notes: ALS Integumentary (Skin) Medical History: Negative for: History of Burn Musculoskeletal Medical History: Negative for: Gout; Rheumatoid Arthritis; Osteoarthritis; Osteomyelitis Neurologic Medical History: Negative for: Dementia; Neuropathy; Quadriplegia; Paraplegia; Seizure Disorder Oncologic Medical History: Negative for: Received Chemotherapy; Received Radiation HBO Extended History Items Eyes: Cataracts Immunizations Pneumococcal Vaccine: Received Pneumococcal Vaccination: No Implantable Devices None Hospitalization / Surgery History Type of Hospitalization/Surgery [facility]- Left BKA 20 years ago spinal surgery cervical x4 plates Family and Social History Cancer: Yes - Maternal Grandparents,Paternal Grandparents,Mother; Diabetes: Yes - Paternal Grandparents; Heart Disease: Yes - Maternal Grandparents,Father; Hypertension: Yes - Mother,Father; Kidney Disease: No; Lung Disease: No; Seizures: No; Stroke: No; Thyroid Problems: No; Tuberculosis: No; Current every day smoker - 1/2 ppd; Marital Status - Married; Alcohol Use: Never; Drug Use: No History; Caffeine Use: Never; Financial Concerns: No; Food, Clothing or Shelter Needs: No; Support System Lacking: No; Transportation Concerns: No Physician Affirmation I have reviewed and  agree with the above information. Electronic Signature(s) Signed: 04/21/2019 12:09:12 PM By: Zenaida Deed  RN, BSN Signed: 04/24/2019 10:37:03 PM By: Lenda Kelp PA-C Entered By: Lenda Kelp on 04/20/2019 18:22:13 -------------------------------------------------------------------------------- SuperBill Details Patient Name: Date of Service: Bruce Deleon. 04/20/2019 Medical Record Number:[id number] Patient Account Number: [id number] Date of Birth/Sex: Treating RN: 1955/12/21 (63 y.o. Bruce Deleon Primary Care Provider: Chaya Jan Other Clinician: Karl Ito Referring Provider: Treating Provider/Extender:Stone III, Vladimir Crofts, ESTELA Weeks in Treatment: 21 Diagnosis Coding ICD-10 Codes Code Description Disruption of external operation (surgical) wound, not elsewhere classified, subsequent T81.31XD encounter [phone] Non-pressure chronic ulcer of other part of left lower leg with fat layer exposed Z89.512 Acquired absence of left leg below knee Facility Procedures CPT4 Code: [id number] Description: 99213 - WOUND CARE VISIT-LEV 3 EST PT Modifier: Quantity: 1 Physician Procedures CPT4: Description Modifier Quantity Code [id number] 99214 - WC PHYS LEVEL 4 - EST PT 1 ICD-10 Diagnosis Description T81.31XD Disruption of external operation (surgical) wound, not elsewhere classified, subsequent encounter L97.822 Non-pressure chronic ulcer  of other part of left lower leg with fat layer exposed Z89.512 Acquired absence of left leg below knee Electronic Signature(s) Signed: 04/20/2019 6:23:40 PM By: Lenda Kelp PA-C Entered By: Lenda Kelp on 04/20/2019 18:23:39

## 2019-04-21 NOTE — Telephone Encounter (Signed)
I have added it to his next appointment.

## 2019-04-21 NOTE — Telephone Encounter (Signed)
Pt needs to be screened for drug toxicology as well. Sorry, but needs to be done again.

## 2019-04-27 ENCOUNTER — Encounter (HOSPITAL_BASED_OUTPATIENT_CLINIC_OR_DEPARTMENT_OTHER): Payer: Medicare Other | Admitting: Physician Assistant

## 2019-04-27 ENCOUNTER — Other Ambulatory Visit: Payer: Self-pay

## 2019-04-27 DIAGNOSIS — L97822 Non-pressure chronic ulcer of other part of left lower leg with fat layer exposed: Secondary | ICD-10-CM | POA: Diagnosis not present

## 2019-04-27 NOTE — Progress Notes (Signed)
Bruce Deleon ([id number]) Visit Report for 04/27/2019 Chief Complaint Document Details Patient Name: Bruce Deleon, Bruce Deleon 04/27/2019 3:00 Date of Service: PM Medical Record [id number] Number: Patient Account Number: [id number] Date of Birth/Sex: Treating RN: 12-27-1955 (63 y.o. Bruce Deleon, Bruce Deleon Other Clinician: Philip Aspen, Primary Care Provider: Minerva Ends Treating Lenda Kelp Provider/Extender: Philip Aspen, Referring Provider: Jacquelin Hawking in Treatment: 22 Information Obtained from: Patient Chief Complaint 11/22/2018; patient is here for review of a wound on his left BKA amputation site Electronic Signature(s) Signed: 04/27/2019 6:05:26 PM By: Lenda Kelp PA-C Entered By: Lenda Kelp on 04/27/2019 15:31:15 -------------------------------------------------------------------------------- HPI Details Patient Name: Bruce Deleon. 04/27/2019 3:00 Date of Service: PM Medical Record [id number] Number: Patient Account Number: [id number] Date of Birth/Sex: Treating RN: 07/29/1955 (63 y.o. Bruce Deleon Other Clinician: Primary Care Provider: Philip Aspen, Bruce Deleon Treating Lenda Kelp Provider/Extender: Philip Aspen, Referring Provider: Jacquelin Hawking in Treatment: 22 History of Present Illness HPI Description: ADMISSION 11/22/2018 [name] is a 63 year old man who developed wounds on his left foot and ankle in late 19 early January. 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 He is followed by Dr. Doneen Poisson of orthopedics. The patient is not a diabetic and has no known history of PAD. He has recently started smoking again after having quit for a prolonged period of time. They have been using a rotating prescription of Iodosorb/triple antibiotic/dry dressing on a 3 daily basis. He has had debridements by Dr. 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 He has been using silver collagen to the wound on the left stump of the BKA. Dressing changes every other day 5/26; left stump wound. Not much change in dimensions. Superior aspect still slightly larger still requiring debridement. We have been using silver collagen 12/29/18 on evaluation today patient actually appears to be doing okay in regard to his leg ulcer at the stump. This section measures a little smaller today although the undermining actually I think it will be deeper than what was noted previously. He is gonna require some sharp debridement at this point based on what I'm seeing. Fortunately he has no pain although he does have some jumping that occurs with the debridment.. 01/05/19 on evaluation today patient actually appears to be doing a little better in regard to the overall appearance of his wound. He still has some Slough noted in the base of the wound in the region of undermining. Nonetheless he states after last week his pain was much higher than what it previously been noted. That was for about two days. Then things finally come back down again we did actually perform a fairly significant debridement at that point which is probably part of the issue. 01/12/19 On evaluation today patient's wound actually appears to be doing is definitely better at this time with regard to the overall improvement in his amputation site ulcer. The sample has been beneficial for him which is excellent news. Fortunately there's no signs of active infection at this time. No fevers, chills, nausea, or vomiting noted at this time. 01/26/19 on evaluation today patient actually appears to be doing quite well in regard to his wound. He's been tolerating the dressing changes without complication which is excellent news. Fortunately there's no signs of active infection at this time which is also good news. No fevers,  chills,  nausea, or vomiting noted at this time. 02/02/19 on evaluation today patient actually appears to be doing well in regard to his left blown amputation ulcer. He's been tolerating the dressing changes without complication. Fortunately there's no signs of active infection at this time. Overall very pleased with the progress and how things seem to have progressed up to this point. With that being said he has been approved for the Snap Vac and has not his out-of-pocket deductible therefore this should be covered for him I think this would be a good option anyway and he's in agreement with proceeding in that regard. 02/09/2019 on evaluation today patient appears to be doing excellent in regard to his left distal below-knee amputation site ulcer. He has been tolerating the dressing changes without complication. Fortunately there is no signs of active infection at this time. No fevers, chills, nausea, vomiting, or diarrhea. He did have some increased pain over last week though it was not in the wound itself but rather in the bone he tells [place]. Fortunately that seems to have resolved he states that sometimes happens he is unsure why. 02/16/2019 on evaluation today patient appears to be doing well with regard to his ulcer. With that being said there is some connective tissue in the base of the wound this is making this much slower than I would like to hope that it could continue to be nonetheless I do believe that the snap VAC is helping to provide additional granulation around the edges of the wound which is great and again I do feel like he is starting to cover over some of the connective tissue in the base of the wound as well which is good to be necessary to get this to heal. 02/23/2019 on evaluation today patient appears to be doing decently well with regard to the distal portion of his left below-knee amputation ulcer. He has been tolerating the dressing changes without complication would been  using the snap VAC. With that being said that the wound has gotten a little bit smaller he still have some connective tissue in the base of the wound and unfortunately this is making it difficult for the area to completely close and heal. Nonetheless I think we may be able to better manage this with more frequent dressing changes such as collagen with drawtex behind. 03/09/2019 on evaluation today patient appears to be doing well with regard to his below-knee amputation ulcer. Unfortunately this is continuing to give him trouble but again it is mainly due to the fact that he has tendon in the base of the wound that just does not seem to be healing appropriately. Fortunately there is no signs of infection he has been tolerating the dressing changes without complication. No fevers, chills, nausea, vomiting, or diarrhea. There is no evidence of infection at this time the overall size is measuring smaller which is good news. In general9/08/2018 on evaluation today patient actually appears to be showing some signs of improvement with regard to his wound. Fortunately I am very pleased with the progress up to this time. With that being said he still has the tendon noted in the base of the wound this is what is keeping her from completely closing we are still working on that. 03/23/2019 on evaluation today patient continues to have difficulty getting the bottom of the wound bed to close completely. He does have some tendon noted at this site again I wonder try some sharp debridement of this currently he is approved for the  Oasis which I am hopeful will help in this regard as well. I plan to apply that today. 03/30/2019 on evaluation today patient appears to be showing signs of the wound appearing about the same as far as the opening although I did see some evidence right in the base of the wound that this is pulling closed little bit more. Again the goal at this point is try to get the area to completely close  which is good to be the best news as far as overall improvement is concerned. With that being said it will have a little divot even when it heals as I previously discussed with him. Today is Oasis application #2. 3/81/8299 on evaluation today patient appears to be doing well with regard to his left amputation site ulcer. There is just a very small opening still remaining in the base of the wound fortunately. There is no signs of infection and overall I do feel like this is closing up I believe the Oasis has been beneficial for him. He is here for application #3 today. 04/13/2019 on evaluation today patient actually appears to be doing quite well with regard to his wound on the left below-knee amputation site. The skin around appears to be doing excellent and the Oasis seems to be doing a great job helping this area to close. It is definitely smaller than last week. This is application #4 today. 04/20/2019 on evaluation today patient appears to be doing about the same as last week with regard to his wound. The small wound opening has not really close much more compared to what we saw last week unfortunately I think this is just going to take some time. I do not see any need for the Oasis at this point I think it has done its job up as far as it cannot this time is just can be a matter of letting this area slowly but hopefully surely close of its own accord. 04/27/2019 on evaluation today patient appears to be doing well with regard to his amputation site ulcer. In fact this appears to be completely healed based on what I am seeing currently I do not see any signs of active infection or drainage at this point. Overall very pleased with how things have progressed. Electronic Signature(s) Signed: 04/27/2019 6:05:26 PM By: Worthy Keeler PA-C Entered By: Worthy Keeler on 04/27/2019 15:45:23 -------------------------------------------------------------------------------- Physical Exam  Details Patient Name: Bruce Deleon, Bruce Deleon 04/27/2019 3:00 Date of Service: PM Medical Record [id number] Number: Patient Account Number: [id number] Date of Birth/Sex: Treating RN: 31-Mar-1956 (63 y.o. Bruce Deleon, Bruce Deleon Other Clinician: Primary Care Provider: Isaac Deleon, Bruce Deleon Treating Worthy Keeler Provider/Extender: Bruce Deleon, Referring Provider: Magdalen Spatz in Treatment: 37 Constitutional Well-nourished and well-hydrated in no acute distress. Respiratory normal breathing without difficulty. Psychiatric this patient is able to make decisions and demonstrates good insight into disease process. Alert and Oriented x 3. pleasant and cooperative. Notes Patient's wound again showed signs of complete epithelization he does have somewhat of a indention where the wound healed but we knew that was good to be the case. Fortunately there is no evidence of active infection at this time no drainage noted currently. Electronic Signature(s) Signed: 04/27/2019 6:05:26 PM By: Worthy Keeler PA-C Entered By: Worthy Keeler on 04/27/2019 15:46:02 -------------------------------------------------------------------------------- Physician Orders Details Patient Name: Bruce Deleon. 04/27/2019 3:00 Date of Service: PM Medical Record [id number] Number: Patient Account Number: [id number] Date of Birth/Sex: Treating RN: 10/31/1955 (63 y.o. Bruce Deleon, Bruce Deleon Other  Clinician: Philip Aspen, Primary Care Provider: Minerva Ends Treating Lenda Kelp Provider/Extender: Philip Aspen, Referring Provider: Jacquelin Hawking in Treatment: 22 Verbal / Phone Orders: No Diagnosis Coding ICD-10 Coding Code Description Disruption of external operation (surgical) wound, not elsewhere classified, subsequent T81.31XD encounter L97.822 Non-pressure chronic ulcer of other part of left lower leg with fat layer exposed Z89.512 Acquired absence of left leg below knee Discharge From [facility] - call if any future wound care needs. Secondary Dressing Dry Gauze - for protection and padding of area x2 weeks. Additional Orders / Instructions Other: - stump shrinker. Electronic Signature(s) Signed: 04/27/2019 5:58:48 PM By: Shawn Stall Signed: 04/27/2019 6:05:26 PM By: Lenda Kelp PA-C Entered By: Shawn Stall on 04/27/2019 15:42:23 -------------------------------------------------------------------------------- Problem List Details Patient Name: Bruce Deleon. 04/27/2019 3:00 Date of Service: PM Medical Record [id number] Number: Patient Account Number: [id number] Date of Birth/Sex: Treating RN: 04-Mar-1956 (63 y.o. Bruce Deleon, Bruce Deleon Other Clinician: Primary Care Provider: Philip Aspen, Bruce Deleon Treating Lenda Kelp Provider/Extender: Philip Aspen, Referring Provider: Jacquelin Hawking in Treatment: 22 Active Problems ICD-10 Evaluated Encounter Code Description Active Date Today Diagnosis T81.31XD Disruption of external operation (surgical) wound, not 11/22/2018 No Yes elsewhere classified, subsequent encounter L97.822 Non-pressure chronic ulcer of other part of left lower 11/22/2018 No Yes leg with fat layer exposed Z89.512 Acquired absence of left leg below knee 11/22/2018 No Yes Inactive Problems Resolved Problems Electronic Signature(s) Signed: 04/27/2019 6:05:26 PM By: Lenda Kelp PA-C Entered By: Lenda Kelp on 04/27/2019 15:31:11 -------------------------------------------------------------------------------- Progress Note Details Patient Name: Bruce Deleon. 04/27/2019 3:00 Date of Service: PM Medical Record [id number] Number: Patient Account Number: [id number] Date of Birth/Sex: Treating RN: 05-31-1956 (63 y.o. Bruce Deleon Other Clinician: Philip Aspen, Primary Care Provider: Vernie Shanks Lenda Kelp Provider/Extender: Philip Aspen, Referring Provider: Referring  Provider: Jacquelin Hawking in Treatment: 22 Subjective Chief Complaint Information obtained from Patient 11/22/2018; patient is here for review of a wound on his left BKA amputation site History of Present Illness (HPI) ADMISSION 11/22/2018 [name] is a 63 year old man who developed wounds on his left foot and ankle in late 19 early January. 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 He is followed by Dr. Doneen Poisson of orthopedics. The patient is not a diabetic and has no known history of PAD. He has recently started smoking again after having quit for a prolonged period of time. They have been using a rotating prescription of Iodosorb/triple antibiotic/dry dressing on a 3 daily basis. He has had debridements by Dr. 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 He has been using silver collagen to the wound on the left stump of the BKA. Dressing changes every other day 5/26; left stump wound. Not much change in dimensions. Superior aspect still slightly larger still requiring debridement. We have been using silver collagen 12/29/18 on evaluation today patient actually appears to be doing okay in regard to his leg ulcer at the stump. This section measures a little smaller today although the undermining actually I think it will be deeper than what was noted previously. He is gonna require some sharp debridement at this point based on what I'm seeing. Fortunately he has no pain although he does have some jumping that occurs with the debridment.. 01/05/19 on evaluation today patient actually appears to be  doing a little better in regard to the overall appearance of his wound. He still has some Slough noted in the base of the wound in the region of undermining. Nonetheless he states after last week his pain was much  higher than what it previously been noted. That was for about two days. Then things finally come back down again we did actually perform a fairly significant debridement at that point which is probably part of the issue. 01/12/19 On evaluation today patient's wound actually appears to be doing is definitely better at this time with regard to the overall improvement in his amputation site ulcer. The sample has been beneficial for him which is excellent news. Fortunately there's no signs of active infection at this time. No fevers, chills, nausea, or vomiting noted at this time. 01/26/19 on evaluation today patient actually appears to be doing quite well in regard to his wound. He's been tolerating the dressing changes without complication which is excellent news. Fortunately there's no signs of active infection at this time which is also good news. No fevers, chills, nausea, or vomiting noted at this time. 02/02/19 on evaluation today patient actually appears to be doing well in regard to his left blown amputation ulcer. He's been tolerating the dressing changes without complication. Fortunately there's no signs of active infection at this time. Overall very pleased with the progress and how things seem to have progressed up to this point. With that being said he has been approved for the Snap Vac and has not his out-of-pocket deductible therefore this should be covered for him I think this would be a good option anyway and he's in agreement with proceeding in that regard. 02/09/2019 on evaluation today patient appears to be doing excellent in regard to his left distal below-knee amputation site ulcer. He has been tolerating the dressing changes without complication. Fortunately there is no signs of active infection at this time. No fevers, chills, nausea, vomiting, or diarrhea. He did have some increased pain over last week though it was not in the wound itself but rather in the bone he tells [place].  Fortunately that seems to have resolved he states that sometimes happens he is unsure why. 02/16/2019 on evaluation today patient appears to be doing well with regard to his ulcer. With that being said there is some connective tissue in the base of the wound this is making this much slower than I would like to hope that it could continue to be nonetheless I do believe that the snap VAC is helping to provide additional granulation around the edges of the wound which is great and again I do feel like he is starting to cover over some of the connective tissue in the base of the wound as well which is good to be necessary to get this to heal. 02/23/2019 on evaluation today patient appears to be doing decently well with regard to the distal portion of his left below-knee amputation ulcer. He has been tolerating the dressing changes without complication would been using the snap VAC. With that being said that the wound has gotten a little bit smaller he still have some connective tissue in the base of the wound and unfortunately this is making it difficult for the area to completely close and heal. Nonetheless I think we may be able to better manage this with more frequent dressing changes such as collagen with drawtex behind. 03/09/2019 on evaluation today patient appears to be doing well with regard to his below-knee amputation  ulcer. Unfortunately this is continuing to give him trouble but again it is mainly due to the fact that he has tendon in the base of the wound that just does not seem to be healing appropriately. Fortunately there is no signs of infection he has been tolerating the dressing changes without complication. No fevers, chills, nausea, vomiting, or diarrhea. There is no evidence of infection at this time the overall size is measuring smaller which is good news. In general9/08/2018 on evaluation today patient actually appears to be showing some signs of improvement with regard to his  wound. Fortunately I am very pleased with the progress up to this time. With that being said he still has the tendon noted in the base of the wound this is what is keeping her from completely closing we are still working on that. 03/23/2019 on evaluation today patient continues to have difficulty getting the bottom of the wound bed to close completely. He does have some tendon noted at this site again I wonder try some sharp debridement of this currently he is approved for the Oasis which I am hopeful will help in this regard as well. I plan to apply that today. 03/30/2019 on evaluation today patient appears to be showing signs of the wound appearing about the same as far as the opening although I did see some evidence right in the base of the wound that this is pulling closed little bit more. Again the goal at this point is try to get the area to completely close which is good to be the best news as far as overall improvement is concerned. With that being said it will have a little divot even when it heals as I previously discussed with him. Today is Oasis application #2. 04/06/2019 on evaluation today patient appears to be doing well with regard to his left amputation site ulcer. There is just a very small opening still remaining in the base of the wound fortunately. There is no signs of infection and overall I do feel like this is closing up I believe the Oasis has been beneficial for him. He is here for application #3 today. 04/13/2019 on evaluation today patient actually appears to be doing quite well with regard to his wound on the left below-knee amputation site. The skin around appears to be doing excellent and the Oasis seems to be doing a great job helping this area to close. It is definitely smaller than last week. This is application #4 today. 04/20/2019 on evaluation today patient appears to be doing about the same as last week with regard to his wound. The small wound opening has not  really close much more compared to what we saw last week unfortunately I think this is just going to take some time. I do not see any need for the Oasis at this point I think it has done its job up as far as it cannot this time is just can be a matter of letting this area slowly but hopefully surely close of its own accord. 04/27/2019 on evaluation today patient appears to be doing well with regard to his amputation site ulcer. In fact this appears to be completely healed based on what I am seeing currently I do not see any signs of active infection or drainage at this point. Overall very pleased with how things have progressed. Patient History Information obtained from Patient. Family History Cancer - Maternal Grandparents,Paternal Grandparents,Mother, Diabetes - Paternal Grandparents, Heart Disease - Maternal Grandparents,Father, Hypertension - Mother,Father,  No family history of Kidney Disease, Lung Disease, Seizures, Stroke, Thyroid Problems, Tuberculosis. Social History Current every day smoker - 1/2 ppd, Marital Status - Married, Alcohol Use - Never, Drug Use - No History, Caffeine Use - Never. Medical History Eyes Patient has history of Cataracts Denies history of Glaucoma, Optic Neuritis Ear/Nose/Mouth/Throat Denies history of Chronic sinus problems/congestion, Middle ear problems Hematologic/Lymphatic Denies history of Anemia, Hemophilia, Human Immunodeficiency Virus, Lymphedema, Sickle Cell Disease Respiratory Denies history of Aspiration, Asthma, Chronic Obstructive Pulmonary Disease (COPD), Pneumothorax, Sleep Apnea, Tuberculosis Cardiovascular Patient has history of Hypertension Denies history of Angina, Arrhythmia, Congestive Heart Failure, Coronary Artery Disease, Deep Vein Thrombosis, Hypotension, Myocardial Infarction, Peripheral Arterial Disease, Peripheral Venous Disease, Phlebitis, Vasculitis Gastrointestinal Denies history of Cirrhosis , Colitis, Crohnoos,  Hepatitis A, Hepatitis B, Hepatitis C Endocrine Denies history of Type I Diabetes, Type II Diabetes Genitourinary Denies history of End Stage Renal Disease Immunological Denies history of Lupus Erythematosus, Raynaudoos, Scleroderma Integumentary (Skin) Denies history of History of Burn Musculoskeletal Denies history of Gout, Rheumatoid Arthritis, Osteoarthritis, Osteomyelitis Neurologic Denies history of Dementia, Neuropathy, Quadriplegia, Paraplegia, Seizure Disorder Oncologic Denies history of Received Chemotherapy, Received Radiation Psychiatric Denies history of Anorexia/bulimia, Confinement Anxiety Hospitalization/Surgery History - [facility]- Left BKA. - 20 years ago spinal surgery cervical x4 plates. Medical And Surgical History Notes Immunological ALS Review of Systems (ROS) Constitutional Symptoms (General Health) Denies complaints or symptoms of Fatigue, Fever, Chills, Marked Weight Change. Respiratory Denies complaints or symptoms of Chronic or frequent coughs, Shortness of Breath. Cardiovascular Denies complaints or symptoms of Chest pain. Psychiatric Denies complaints or symptoms of Claustrophobia, Suicidal. Objective Constitutional Well-nourished and well-hydrated in no acute distress. Vitals Time Taken: 3:22 PM, Height: 72 in, Weight: 130 lbs, BMI: 17.6, Temperature: 98.5 F, Pulse: 75 bpm, Respiratory Rate: 16 breaths/min, Blood Pressure: 136/56 mmHg. Respiratory normal breathing without difficulty. Psychiatric this patient is able to make decisions and demonstrates good insight into disease process. Alert and Oriented x 3. pleasant and cooperative. General Notes: Patient's wound again showed signs of complete epithelization he does have somewhat of a indention where the wound healed but we knew that was good to be the case. Fortunately there is no evidence of active infection at this time no drainage noted currently. Integumentary (Hair,  Skin) Wound #1 status is Open. Original cause of wound was Surgical Injury. The wound is located on the Left Amputation Site - Below Knee. The wound measures 0cm length x 0cm width x 0cm depth; 0cm^2 area and 0cm^3 volume. There is no tunneling or undermining noted. There is a none present amount of drainage noted. The wound margin is thickened. There is no granulation within the wound bed. There is no necrotic tissue within the wound bed. 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 Secondary Dressing: Dry Gauze - for protection and padding of area x2 weeks. Additional Orders / Instructions: Other: - stump shrinker. 1. My suggestion at this time is going to be that based on how well things are doing we go and discontinue wound care services and the patient is in agreement with that plan. 2. With regard to the stump shrinker I think he should still go ahead and wear this in order to keep  the swelling under control that seems to have done well for him he has not noted any drainage for a couple of days that is good news I think this area has healed. 3. With regard to the distal portion of his stump I do want him to use some lotion in order to keep things moist specially with his much dry skin as he has and I do think he should wait at least 2 weeks before seeing them for the appointment in regard to his prosthesis although hopefully should be able to get into that soon I know he has been waiting for this for quite some time. We will see the patient back for follow-up as needed if anything changes or worsens. Electronic Signature(s) Signed: 04/27/2019 6:05:26 PM By: Lenda Kelp  PA-C Entered By: Lenda Kelp on 04/27/2019 15:47:01 -------------------------------------------------------------------------------- HxROS Details Patient Name: Bruce Deleon. 04/27/2019 3:00 Date of Service: PM Medical Record [id number] Number: Patient Account Number: [id number] Date of Birth/Sex: Treating RN: 11-04-1955 (63 y.o. Bruce Deleon, Bruce Deleon Other Clinician: Primary Care Provider: Philip Aspen, Bruce Deleon Treating Lenda Kelp Provider/Extender: Philip Aspen, Referring Provider: Jacquelin Hawking in Treatment: 22 Information Obtained From Patient Constitutional Symptoms (General Health) Complaints and Symptoms: Negative for: Fatigue; Fever; Chills; Marked Weight Change Respiratory Complaints and Symptoms: Negative for: Chronic or frequent coughs; Shortness of Breath Medical History: Negative for: Aspiration; Asthma; Chronic Obstructive Pulmonary Disease (COPD); Pneumothorax; Sleep Apnea; Tuberculosis Cardiovascular Complaints and Symptoms: Negative for: Chest pain Medical History: Positive for: Hypertension Negative for: Angina; Arrhythmia; Congestive Heart Failure; Coronary Artery Disease; Deep Vein Thrombosis; Hypotension; Myocardial Infarction; Peripheral Arterial Disease; Peripheral Venous Disease; Phlebitis; Vasculitis Psychiatric Complaints and Symptoms: Negative for: Claustrophobia; Suicidal Medical History: Negative for: Anorexia/bulimia; Confinement Anxiety Eyes Medical History: Positive for: Cataracts Negative for: Glaucoma; Optic Neuritis Ear/Nose/Mouth/Throat Medical History: Negative for: Chronic sinus problems/congestion; Middle ear problems Hematologic/Lymphatic Medical History: Negative for: Anemia; Hemophilia; Human Immunodeficiency Virus; Lymphedema; Sickle Cell Disease Gastrointestinal Medical History: Negative for: Cirrhosis ; Colitis; Crohns; Hepatitis A; Hepatitis B; Hepatitis C Endocrine Medical History: Negative for: Type I  Diabetes; Type II Diabetes Genitourinary Medical History: Negative for: End Stage Renal Disease Immunological Medical History: Negative for: Lupus Erythematosus; Raynauds; Scleroderma Past Medical History Notes: ALS Integumentary (Skin) Medical History: Negative for: History of Burn Musculoskeletal Medical History: Negative for: Gout; Rheumatoid Arthritis; Osteoarthritis; Osteomyelitis Neurologic Medical History: Negative for: Dementia; Neuropathy; Quadriplegia; Paraplegia; Seizure Disorder Oncologic Medical History: Negative for: Received Chemotherapy; Received Radiation HBO Extended History Items Eyes: Cataracts Immunizations Pneumococcal Vaccine: Received Pneumococcal Vaccination: No Implantable Devices None Hospitalization / Surgery History Type of Hospitalization/Surgery [facility]- Left BKA 20 years ago spinal surgery cervical x4 plates Family and Social History Cancer: Yes - Maternal Grandparents,Paternal Grandparents,Mother; Diabetes: Yes - Paternal Grandparents; Heart Disease: Yes - Maternal Grandparents,Father; Hypertension: Yes - Mother,Father; Kidney Disease: No; Lung Disease: No; Seizures: No; Stroke: No; Thyroid Problems: No; Tuberculosis: No; Current every day smoker - 1/2 ppd; Marital Status - Married; Alcohol Use: Never; Drug Use: No History; Caffeine Use: Never; Financial Concerns: No; Food, Clothing or Shelter Needs: No; Support System Lacking: No; Transportation Concerns: No Physician Affirmation I have reviewed and agree with the above information. Electronic Signature(s) Signed: 04/27/2019 5:58:48 PM By: Shawn Stall Signed: 04/27/2019 6:05:26 PM By: Lenda Kelp PA-C Entered By: Lenda Kelp on 04/27/2019 15:45:40 -------------------------------------------------------------------------------- SuperBill Details Patient Name: Date of Service: Bruce Deleon. 04/27/2019 Medical Record Number:[id number] Patient Account Number:  [id number] Date of Birth/Sex: Treating RN:  04/05/1956 (63 y.o. Bruce Deleon) Deleon, Bruce Primary Care Provider: Chaya JanHERNANDEZ ACOSTA, Bruce Deleon Other Clinician: Referring Provider: Treating Provider/Extender:Stone III, Vladimir CroftsHoyt HERNANDEZ ACOSTA, Bruce Deleon Weeks in Treatment: 22 Diagnosis Coding ICD-10 Codes Code Description Disruption of external operation (surgical) wound, not elsewhere classified, subsequent T81.31XD encounter L97.822 Non-pressure chronic ulcer of other part of left lower leg with fat layer exposed Z89.512 Acquired absence of left leg below knee Facility Procedures CPT4 Code: [id number] Description: 99213 - WOUND CARE VISIT-LEV 3 EST PT Modifier: Quantity: 1 Physician Procedures Electronic Signature(s) Signed: 04/27/2019 6:05:26 PM By: Lenda KelpStone III, Hoyt PA-C Entered By: Lenda KelpStone III, Hoyt on 04/27/2019 15:47:12

## 2019-05-25 ENCOUNTER — Encounter: Payer: Medicare Other | Admitting: Physical Medicine and Rehabilitation

## 2019-06-15 ENCOUNTER — Encounter: Payer: Medicare Other | Admitting: Physical Medicine and Rehabilitation

## 2019-06-21 NOTE — Progress Notes (Signed)
BRANNDON, Deleon ([id number]) Visit Report for 04/13/2019 Arrival Information Details Patient Name: Bruce Deleon, Bruce Deleon 04/13/2019 4:00 Date of Service: PM Medical Record [id number] Number: Patient Account Number: [id number] Date of Birth/Sex: Treating RN: 1956/05/30 (63 y.o. Bruce Deleon) Epps, Morey Hummingbird Other Clinician: Isaac Bliss, Primary Care Zeola Brys: Olam Idler Treating Worthy Keeler Lorena Benham/Extender: Isaac Bliss, Referring Lowell Mcgurk: Magdalen Spatz in Treatment: 20 Visit Information History Since Last Visit All ordered tests and consults were completed: No Patient Arrived: Wheel Chair Added or deleted any medications: No Arrival Time: 15:57 Any new allergies or adverse reactions: No Accompanied By: wife Had a fall or experienced change in No activities of daily living that may affect Transfer Assistance: None risk of falls: Patient Identification Verified: Yes Signs or symptoms of abuse/neglect since last No Secondary Verification Process Yes visito Completed: Hospitalized since last visit: No Patient Requires Transmission-Based No Implantable device outside of the clinic excluding No Precautions: cellular tissue based products placed in the center Patient Has Alerts: Yes since last visit: Has Dressing in Place as Prescribed: Yes Pain Present Now: No Electronic Signature(s) Signed: 06/21/2019 2:58:33 PM By: Carlene Coria RN Entered By: Carlene Coria on 04/13/2019 16:06:26 -------------------------------------------------------------------------------- [place] Details Patient Name: Bruce Deleon. 04/13/2019 4:00 Date of Service: PM Medical Record [id number] Number: Patient Account Number: [id number] Date of Birth/Sex: Treating RN: Oct 30, 1955 (63 y.o. Ernestene Mention Other Clinician: Primary Care Kano Heckmann: Isaac Bliss, ESTELA Treating Worthy Keeler Alin Hutchins/Extender: Isaac Bliss, Referring Stephens Shreve: Magdalen Spatz in Treatment:  20 Active Inactive Wound/Skin Impairment Nursing Diagnoses: Knowledge deficit related to ulceration/compromised skin integrity Goals: Patient/caregiver will verbalize understanding of skin care regimen Date Initiated: 12/29/2018 Target Resolution Date: 04/27/2019 Goal Status: Active Ulcer/skin breakdown will have a volume reduction of 30% by week 4 Date Initiated: 11/29/2018 Date Inactivated: 12/29/2018 Target Resolution Date: 12/24/2018 Goal Status: Met Ulcer/skin breakdown will have a volume reduction of 50% by week 8 Date Initiated: 12/29/2018 Date Inactivated: 02/02/2019 Target Resolution Date: 01/26/2019 Goal Status: Met Ulcer/skin breakdown will have a volume reduction of 80% by week 12 Date Initiated: 02/02/2019 Date Inactivated: 02/23/2019 Target Resolution Date: 02/23/2019 Unmet Reason: stalled, Goal Status: Unmet PAD Interventions: Assess patient/caregiver ability to obtain necessary supplies Assess patient/caregiver ability to perform ulcer/skin care regimen upon admission and as needed Provide education on ulcer and skin care Treatment Activities: Skin care regimen initiated : 11/22/2018 Topical wound management initiated : 11/22/2018 Notes: Electronic Signature(s) Signed: 04/13/2019 6:37:17 PM By: Baruch Gouty RN, BSN Entered By: Baruch Gouty on 04/13/2019 16:27:42 -------------------------------------------------------------------------------- Pain Assessment Details Patient Name: Bruce Deleon. 04/13/2019 4:00 Date of Service: PM Medical Record [id number] Number: Patient Account Number: [id number] Date of Birth/Sex: Treating RN: August 04, 1955 (63 y.o. Bruce Deleon) Carlene Coria Other Clinician: Primary Care Bashir Marchetti: Isaac Bliss, Levant III, Hoyt Zacchary Pompei/Extender: Isaac Bliss, Referring Waunetta Riggle: Magdalen Spatz in Treatment: 20 Active Problems Location of Pain Severity and Description of Pain Patient Has Paino No Site Locations Pain Management  and Medication Current Pain Management: Electronic Signature(s) Signed: 06/21/2019 2:58:33 PM By: Carlene Coria RN Entered By: Carlene Coria on 04/13/2019 16:07:15 -------------------------------------------------------------------------------- Patient/Caregiver Education Details Patient Name: Bruce Deleon 9/30/2020andnbsp4:00 Date of Service: PM Medical Record [id number] Number: Patient Account Number: [id number] Date of Birth/Gender: 26-Sep-1955 (63 y.o. M) Treating RN: Baruch Gouty Primary Care Other Clinician: Isaac Bliss, Physician: Olam Idler Treating Worthy Keeler Physician/Extender: Referring Physician: Queen Blossom in Treatment: 20 Education Assessment Education Provided To: Patient Education Topics Provided Wound/Skin Impairment: Methods: Explain/Verbal Responses: Reinforcements needed, State content  correctly Electronic Signature(s) Signed: 04/13/2019 6:37:17 PM By: Baruch Gouty RN, BSN Entered By: Baruch Gouty on 04/13/2019 16:28:11 -------------------------------------------------------------------------------- Wound Assessment Details Patient Name: Bruce Deleon. 04/13/2019 4:00 Date of Service: PM Medical Record [id number] Number: Patient Account Number: [id number] Date of Birth/Sex: Treating RN: 27-Jun-1956 (63 y.o. Ernestene Mention Other Clinician: Isaac Bliss, Primary Care Alexie Lanni: Olam Idler Treating Worthy Keeler Dontavion Noxon/Extender: Isaac Bliss, Referring Keelyn Fjelstad: Magdalen Spatz in Treatment: 20 Wound Status Wound Number: 1 Primary Etiology: Open Surgical Wound Wound Location: Left Amputation Site - Below Knee Wound Status: Open Wounding Event: Surgical Injury Comorbid History: Cataracts, Hypertension Date Acquired: 08/06/2018 Weeks Of Treatment: 20 Clustered Wound: No Photos Wound Measurements Length: (cm) 0.2 Width: (cm) 0.2 Depth: (cm) 0.1 Area: (cm) 0.031 Volume: (cm) 0.003 Wound  Description Classification: Full Thickness With Exposed Support Structures Wound Thickened Margin: Exudate Small Amount: Exudate Type: Serosanguineous Exudate red, brown Color: Wound Bed Granulation Amount: Large (67-100%) Granulation Quality: Pink Necrotic Amount: None Present (0%) After Cleansing: No rino No Exposed Structure sed: No Subcutaneous Tissue) Exposed: Yes sed: No sed: No ed: No d: No % Reduction in Area: 99.7% % Reduction in Volume: 99.9% Epithelialization: Large (67-100%) Tunneling: No Undermining: No Foul Odor Slough/Fib Fascia Expo Fat Layer ( Tendon Expo Muscle Expo Joint Expos Bone Expose Electronic Signature(s) Signed: 04/15/2019 7:05:19 PM By: Baruch Gouty RN, BSN Signed: 04/18/2019 6:17:12 PM By: Levan Hurst RN, BSN Previous Signature: 04/14/2019 3:21:35 PM Version By: Mikeal Hawthorne EMT/HBOT Previous Signature: 04/13/2019 6:37:17 PM Version By: Baruch Gouty RN, BSN Entered By: Levan Hurst on 04/15/2019 09:30:33 -------------------------------------------------------------------------------- Vitals Details Patient Name: Bruce Deleon. 04/13/2019 4:00 Date of Service: PM Medical Record [id number] Number: Patient Account Number: [id number] Date of Birth/Sex: Treating RN: 1956-01-05 (63 y.o. Bruce Deleon) Epps, Morey Hummingbird Other Clinician: Isaac Bliss, Primary Care Alyssabeth Bruster: Olam Idler Treating Worthy Keeler Amauria Younts/Extender: Isaac Bliss, Referring Abdulaziz Toman: Magdalen Spatz in Treatment: 20 Vital Signs Time Taken: 16:06 Temperature (F): 98.5 Height (in): 72 Pulse (bpm): 84 Weight (lbs): 130 Respiratory Rate (breaths/min): 18 Body Mass Index (BMI): 17.6 Blood Pressure (mmHg): 156/82 Reference Range: 80 - 120 mg / dl Electronic Signature(s) Signed: 06/21/2019 2:58:33 PM By: Carlene Coria RN Entered By: Carlene Coria on 04/13/2019 16:07:05

## 2019-06-21 NOTE — Progress Notes (Signed)
Bruce Deleon, Bruce Deleon ([id number]) Visit Report for 04/20/2019 Arrival Information Details Patient Name: Bruce Deleon, Bruce Deleon 04/20/2019 3:45 Date of Service: PM Medical Record [id number] Number: Patient Account Number: [id number] Date of Birth/Sex: Treating RN: [birth year] (63 y.o. Jerilynn Mages) Carlene Coria Other Clinician: Gorden Harms Primary Care Delcenia Inman: Olam Idler Treating Worthy Keeler Kenderick Kobler/Extender: Isaac Bliss, Referring Haddon Fyfe: Magdalen Spatz in Treatment: 21 Visit Information History Since Last Visit All ordered tests and consults were completed: No Patient Arrived: Wheel Chair Added or deleted any medications: No Arrival Time: 16:32 Any new allergies or adverse reactions: No Accompanied By: wife Had a fall or experienced change in No activities of daily living that may affect Transfer Assistance: None risk of falls: Patient Identification Verified: Yes Signs or symptoms of abuse/neglect since last No Secondary Verification Process Yes visito Completed: Hospitalized since last visit: No Patient Requires Transmission-Based No Implantable device outside of the clinic excluding No Precautions: cellular tissue based products placed in the center Patient Has Alerts: Yes since last visit: Has Dressing in Place as Prescribed: Yes Pain Present Now: No Electronic Signature(s) Signed: 06/21/2019 2:59:38 PM By: Carlene Coria RN Entered By: Carlene Coria on 04/20/2019 16:33:21 -------------------------------------------------------------------------------- Clinic Level of Care Assessment Details Patient Name: AHAAN, ZOBRIST 04/20/2019 3:45 Date of Service: PM Medical Record [id number] Number: Patient Account Number: [id number] Date of Birth/Sex: Treating RN: 12-13-1955 (63 y.o. Ernestene Mention Other Clinician: Primary Care Tamella Tuccillo: Coralee Rud, Destiny ESTELA Treating Worthy Keeler Blimi Godby/Extender: Isaac Bliss, Referring  Codi Folkerts: Magdalen Spatz in Treatment: 21 Clinic Level of Care Assessment Items TOOL 4 Quantity Score []  - Use when only an EandM is performed on FOLLOW-UP visit 0 ASSESSMENTS - Nursing Assessment / Reassessment X - Reassessment of Co-morbidities (includes updates in patient status) 1 10 X - Reassessment of Adherence to Treatment Plan 1 5 ASSESSMENTS - Wound and Skin Assessment / Reassessment X - Simple Wound Assessment / Reassessment - one wound 1 5 []  - Complex Wound Assessment / Reassessment - multiple wounds 0 []  - Dermatologic / Skin Assessment (not related to wound area) 0 ASSESSMENTS - Focused Assessment []  - Circumferential Edema Measurements - multi extremities 0 []  - Nutritional Assessment / Counseling / Intervention 0 []  - Lower Extremity Assessment (monofilament, tuning fork, pulses) 0 []  - Peripheral Arterial Disease Assessment (using hand held doppler) 0 ASSESSMENTS - Ostomy and/or Continence Assessment and Care []  - Incontinence Assessment and Management 0 []  - Ostomy Care Assessment and Management (repouching, etc.) 0 PROCESS - Coordination of Care X - Simple Patient / Family Education for ongoing care 1 15 []  - Complex (extensive) Patient / Family Education for ongoing care 0 X - Staff obtains Programmer, systems, Records, Test Results / Process Orders 1 10 []  - Staff telephones HHA, Nursing Homes / Clarify orders / etc 0 []  - Routine Transfer to another Facility (non-emergent condition) 0 []  - Routine Hospital Admission (non-emergent condition) 0 []  - New Admissions / Biomedical engineer / Ordering NPWT, Apligraf, etc. 0 []  - Emergency Hospital Admission (emergent condition) 0 X - Simple Discharge Coordination 1 10 []  - Complex (extensive) Discharge Coordination 0 PROCESS - Special Needs []  - Pediatric / Minor Patient Management 0 []  - Isolation Patient Management 0 []  - Hearing / Language / Visual special needs 0 []  - Assessment of Community assistance (transportation,  D/C planning, etc.) 0 []  - Additional assistance / Altered mentation 0 []  - Support Surface(s) Assessment (bed, cushion, seat, etc.) 0 INTERVENTIONS - Wound Cleansing / Measurement X -  Simple Wound Cleansing - one wound 1 5 []  - Complex Wound Cleansing - multiple wounds 0 X - Wound Imaging (photographs - any number of wounds) 1 5 []  - Wound Tracing (instead of photographs) 0 X - Simple Wound Measurement - one wound 1 5 []  - Complex Wound Measurement - multiple wounds 0 INTERVENTIONS - Wound Dressings X - Small Wound Dressing one or multiple wounds 1 10 []  - Medium Wound Dressing one or multiple wounds 0 []  - Large Wound Dressing one or multiple wounds 0 X - Application of Medications - topical 1 5 []  - Application of Medications - injection 0 INTERVENTIONS - Miscellaneous []  - External ear exam 0 []  - Specimen Collection (cultures, biopsies, blood, body fluids, etc.) 0 []  - Specimen(s) / Culture(s) sent or taken to Lab for analysis 0 []  - Patient Transfer (multiple staff / [employer] fast streamer / Similar devices) 0 []  - Simple Staple / Suture removal (25 or less) 0 []  - Complex Staple / Suture removal (26 or more) 0 []  - Hypo / Hyperglycemic Management (close monitor of Blood Glucose) 0 []  - Ankle / Brachial Index (ABI) - do not check if billed separately 0 X - Vital Signs 1 5 Has the patient been seen at the hospital within the last three years: Yes Total Score: 90 Level Of Care: New/Established - Level 3 Electronic Signature(s) Signed: 04/21/2019 12:09:12 PM By: Baruch Gouty RN, BSN Entered By: Baruch Gouty on 04/20/2019 17:16:35 -------------------------------------------------------------------------------- Encounter Discharge Information Details Patient Name: Bruce Deleon. 04/20/2019 3:45 Date of Service: PM Medical Record [id number] Number: Patient Account Number: [id number] Date of Birth/Sex: Treating RN: April 16, 1956 (63 y.o. Ernestene Mention Other Clinician: Gorden Harms Primary Care Alyxandria Wentz: Olam Idler Treating Worthy Keeler Robi Mitter/Extender: Isaac Bliss, Referring Shamya Macfadden: Magdalen Spatz in Treatment: 21 Encounter Discharge Information Items Discharge Condition: Stable Ambulatory Status: Wheelchair Discharge Destination: Home Transportation: Private Auto Accompanied By: spouse Schedule Follow-up Appointment: Yes Clinical Summary of Care: Patient Declined Electronic Signature(s) Signed: 04/21/2019 12:09:12 PM By: Baruch Gouty RN, BSN Entered By: Baruch Gouty on 04/20/2019 17:30:15 -------------------------------------------------------------------------------- [place] Details Patient Name: Bruce Deleon. 04/20/2019 3:45 Date of Service: PM Medical Record [id number] Number: Patient Account Number: [id number] Date of Birth/Sex: Treating RN: 08/22/1955 (63 y.o. Ernestene Mention Other Clinician: Primary Care Bertil Brickey: Coralee Rud, Destiny ESTELA Treating Worthy Keeler Ilee Randleman/Extender: Isaac Bliss, Referring Deakin Lacek: Magdalen Spatz in Treatment: 21 Active Inactive Wound/Skin Impairment Nursing Diagnoses: Knowledge deficit related to ulceration/compromised skin integrity Goals: Patient/caregiver will verbalize understanding of skin care regimen Date Initiated: 12/29/2018 Target Resolution Date: 04/27/2019 Goal Status: Active Ulcer/skin breakdown will have a volume reduction of 30% by week 4 Date Initiated: 11/29/2018 Date Inactivated: 12/29/2018 Target Resolution Date: 12/24/2018 Goal Status: Met Ulcer/skin breakdown will have a volume reduction of 50% by week 8 Date Initiated: 12/29/2018 Date Inactivated: 02/02/2019 Target Resolution Date: 01/26/2019 Goal Status: Met Ulcer/skin breakdown will have a volume reduction of 80% by week 12 Date Initiated: 02/02/2019 Date Inactivated: 02/23/2019 Target Resolution Date: 02/23/2019 Unmet Reason: stalled, Goal Status:  Unmet PAD Interventions: Assess patient/caregiver ability to obtain necessary supplies Assess patient/caregiver ability to perform ulcer/skin care regimen upon admission and as needed Provide education on ulcer and skin care Treatment Activities: Skin care regimen initiated : 11/22/2018 Topical wound management initiated : 11/22/2018 Notes: Electronic Signature(s) Signed: 04/21/2019 12:09:12 PM By: Baruch Gouty RN, BSN Entered By: Baruch Gouty on 04/20/2019 17:15:44 -------------------------------------------------------------------------------- Pain Assessment Details Patient Name: Bruce Deleon. 04/20/2019  3:45 Date of Service: PM Medical Record [id number] Number: Patient Account Number: [id number] Date of Birth/Sex: Treating RN: 03-Aug-1955 (63 y.o. Jerilynn Mages) Carlene Coria Other Clinician: Primary Care Laquita Harlan: Coralee Rud, Destiny Parkwood III, Hoyt Dontai Pember/Extender: Isaac Bliss, Referring Kaysha Parsell: Magdalen Spatz in Treatment: 21 Active Problems Location of Pain Severity and Description of Pain Patient Has Paino No Site Locations Pain Management and Medication Current Pain Management: Electronic Signature(s) Signed: 06/21/2019 2:59:38 PM By: Carlene Coria RN Entered By: Carlene Coria on 04/20/2019 16:34:13 -------------------------------------------------------------------------------- Patient/Caregiver Education Details Patient Name: Bruce Deleon 10/7/2020andnbsp3:45 Date of Service: PM Medical Record [id number] Number: Patient Account Number: [id number] Date of Birth/Gender: 06-29-1956 (63 y.o. M) Treating RN: Baruch Gouty Primary Care Other Clinician: Coralee Rud, Destiny Physician: Olam Idler Treating Worthy Keeler Physician/Extender: Isaac Bliss, Referring Physician: Magdalen Spatz in Treatment: 21 Education Assessment Education Provided To: Patient Education Topics Provided Wound/Skin Impairment: Methods:  Explain/Verbal Responses: Reinforcements needed, State content correctly Electronic Signature(s) Signed: 04/21/2019 12:09:12 PM By: Baruch Gouty RN, BSN Entered By: Baruch Gouty on 04/20/2019 17:15:58 -------------------------------------------------------------------------------- Wound Assessment Details Patient Name: Bruce Deleon. 04/20/2019 3:45 Date of Service: PM Medical Record [id number] Number: Patient Account Number: [id number] Date of Birth/Sex: Treating RN: 1956/06/21 (63 y.o. 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 Wound Status: Open Wounding Event: Surgical Injury Comorbid History: Cataracts, Hypertension Date Acquired: 08/06/2018 Weeks Of Treatment: 21 Clustered Wound: No Wound Measurements Length: (cm) 0.1 % Reduction Width: (cm) 0.1 % Reduction Depth: (cm) 0.1 Epitheliali Area: (cm) 0.008 Tunneling: Volume: (cm) 0.001 Underminin Wound Description Full Thickness With Exposed Support Foul Odor Classification: Structures Slough/Fib Wound Thickened Margin: Exudate Small Amount: Exudate Type: Serosanguineous Exudate red, brown Color: Wound Bed Granulation Amount: Large (67-100%) Granulation Quality: Pink Fascia Expo Necrotic Amount: None Present (0%) Fat Layer ( Tendon Expo Muscle Expo Joint Expos Bone Expose After Cleansing: No rino No Exposed Structure sed: No Subcutaneous Tissue) Exposed: Yes sed: No sed: No ed: No d: No in Area: 99.9% in Volume: 100% zation: Large (67-100%) No g: No Electronic Signature(s) Signed: 06/21/2019 2:59:38 PM By: Carlene Coria RN Entered By: Carlene Coria on 04/20/2019  16:38:39 -------------------------------------------------------------------------------- Vitals Details Patient Name: Bruce Deleon. 04/20/2019 3:45 Date of Service: PM Medical Record [id number] Number: Patient Account Number: [id number] Date of Birth/Sex: Treating RN: 02-Apr-1956 (63 y.o. Jerilynn Mages) Carlene Coria Other Clinician: Primary Care Marianita Botkin: Coralee Rud, Destiny ESTELA Treating Worthy Keeler Jakell Trusty/Extender: Isaac Bliss, Referring Teige Rountree: Magdalen Spatz in Treatment: 21 Vital Signs Time Taken: 16:33 Temperature (F): 98.3 Height (in): 72 Pulse (bpm): 81 Weight (lbs): 130 Respiratory Rate (breaths/min): 18 Body Mass Index (BMI): 17.6 Blood Pressure (mmHg): 142/78 Reference Range: 80 - 120 mg / dl Electronic Signature(s) Signed: 06/21/2019 2:59:38 PM By: Carlene Coria RN Entered By: Carlene Coria on 04/20/2019 16:34:03

## 2019-06-21 NOTE — Progress Notes (Signed)
Bruce Deleon, Bruce Deleon ([id number]) Visit Report for 04/27/2019 Arrival Information Details Patient Name: Bruce Deleon, Bruce Deleon 04/27/2019 3:00 Date of Service: PM Medical Record [id number] Number: Patient Account Number: [id number] Date of Birth/Sex: Treating RN: 04/18/1956 (63 y.o. Bruce Deleon) Epps, Bruce Deleon Other Clinician: Isaac Deleon, Primary Care Bruce Deleon: Bruce Deleon Treating Worthy Keeler Tyria Springer/Extender: Bruce Deleon, Referring Bruce Deleon: Bruce Deleon in Treatment: 22 Visit Information History Since Last Visit All ordered tests and consults were completed: No Patient Arrived: Wheel Chair Added or deleted any medications: No Arrival Time: 15:22 Any new allergies or adverse reactions: No Accompanied By: wife Had a fall or experienced change in No activities of daily living that may affect Transfer Assistance: None risk of falls: Patient Identification Verified: Yes Signs or symptoms of abuse/neglect since last No Secondary Verification Process Yes visito Completed: Hospitalized since last visit: No Patient Requires Transmission-Based No Implantable device outside of the clinic excluding No Precautions: cellular tissue based products placed in the center Patient Has Alerts: Yes since last visit: Has Dressing in Place as Prescribed: Yes Pain Present Now: No Electronic Signature(s) Signed: 06/21/2019 3:01:15 PM By: Bruce Coria RN Entered By: Bruce Deleon on 04/27/2019 15:22:33 -------------------------------------------------------------------------------- Clinic Level of Care Assessment Details Patient Name: Bruce Deleon, Bruce Deleon 04/27/2019 3:00 Date of Service: PM Medical Record [id number] Number: Patient Account Number: [id number] Date of Birth/Sex: Treating RN: June 22, 1956 (63 y.o. Bruce Deleon, Meta.Reding Other Clinician: Primary Care Bruce Deleon: Bruce Deleon, Bruce Deleon Treating Worthy Keeler Bruce Deleon/Extender: Bruce Deleon, Referring Jenness Stemler: Bruce Deleon in Treatment:  22 Clinic Level of Care Assessment Items TOOL 4 Quantity Score X - Use when only an EandM is performed on FOLLOW-UP visit 1 0 ASSESSMENTS - Nursing Assessment / Reassessment X - Reassessment of Co-morbidities (includes updates in patient status) 1 10 X - Reassessment of Adherence to Treatment Plan 1 5 ASSESSMENTS - Wound and Skin Assessment / Reassessment X - Simple Wound Assessment / Reassessment - one wound 1 5 []  - Complex Wound Assessment / Reassessment - multiple wounds 0 X - Dermatologic / Skin Assessment (not related to wound area) 1 10 ASSESSMENTS - Focused Assessment []  - Circumferential Edema Measurements - multi extremities 0 X - Nutritional Assessment / Counseling / Intervention 1 10 []  - Lower Extremity Assessment (monofilament, tuning fork, pulses) 0 []  - Peripheral Arterial Disease Assessment (using hand held doppler) 0 ASSESSMENTS - Ostomy and/or Continence Assessment and Care []  - Incontinence Assessment and Management 0 []  - Ostomy Care Assessment and Management (repouching, etc.) 0 PROCESS - Coordination of Care X - Simple Patient / Family Education for ongoing care 1 15 []  - Complex (extensive) Patient / Family Education for ongoing care 0 X - Staff obtains Programmer, systems, Records, Test Results / Process Orders 1 10 []  - Staff telephones HHA, Nursing Homes / Clarify orders / etc 0 []  - Routine Transfer to another Facility (non-emergent condition) 0 []  - Routine Hospital Admission (non-emergent condition) 0 []  - New Admissions / Biomedical engineer / Ordering NPWT, Apligraf, etc. 0 []  - Emergency Hospital Admission (emergent condition) 0 X - Simple Discharge Coordination 1 10 []  - Complex (extensive) Discharge Coordination 0 PROCESS - Special Needs []  - Pediatric / Minor Patient Management 0 []  - Isolation Patient Management 0 []  - Hearing / Language / Visual special needs 0 []  - Assessment of Community assistance (transportation, D/C planning, etc.) 0 []  -  Additional assistance / Altered mentation 0 []  - Support Surface(s) Assessment (bed, cushion, seat, etc.) 0 INTERVENTIONS - Wound Cleansing / Measurement X - Simple  Wound Cleansing - one wound 1 5 []  - Complex Wound Cleansing - multiple wounds 0 X - Wound Imaging (photographs - any number of wounds) 1 5 []  - Wound Tracing (instead of photographs) 0 X - Simple Wound Measurement - one wound 1 5 []  - Complex Wound Measurement - multiple wounds 0 INTERVENTIONS - Wound Dressings X - Small Wound Dressing one or multiple wounds 1 10 []  - Medium Wound Dressing one or multiple wounds 0 []  - Large Wound Dressing one or multiple wounds 0 []  - Application of Medications - topical 0 []  - Application of Medications - injection 0 INTERVENTIONS - Miscellaneous []  - External ear exam 0 []  - Specimen Collection (cultures, biopsies, blood, body fluids, etc.) 0 []  - Specimen(s) / Culture(s) sent or taken to Lab for analysis 0 []  - Patient Transfer (multiple staff / Nurse, adultHoyer Lift / Similar devices) 0 []  - Simple Staple / Suture removal (25 or less) 0 []  - Complex Staple / Suture removal (26 or more) 0 []  - Hypo / Hyperglycemic Management (close monitor of Blood Glucose) 0 []  - Ankle / Brachial Index (ABI) - do not check if billed separately 0 X - Vital Signs 1 5 Has the patient been seen at the hospital within the last three years: Yes Total Score: 105 Level Of Care: New/Established - Level 3 Electronic Signature(s) Signed: 04/27/2019 5:58:48 PM By: Bruce Deleon, Bruce Deleon Entered By: Bruce Deleon, Bruce Deleon on 04/27/2019 15:41:18 -------------------------------------------------------------------------------- Encounter Discharge Information Details Patient Name: Bruce Deleon, Bruce W. 04/27/2019 3:00 Date of Service: PM Medical Record [id number] Number: Patient Account Number: [id number] Date of Birth/Sex: Treating RN: [birth year] (63 y.o. Bruce Deleon) Bruce Deleon Other Clinician: Philip AspenHERNANDEZ Deleon, Primary Care Jannetta Massey: Bruce EndsESTELA  Treating Lenda KelpStone III, Hoyt Beyza Bellino/Extender: Bruce Deleon, Referring Zayonna Ayuso: Jacquelin HawkingESTELA Weeks in Treatment: 22 Encounter Discharge Information Items Discharge Condition: Stable Ambulatory Status: Wheelchair Discharge Destination: Home Transportation: Private Auto Accompanied By: wife Schedule Follow-up Appointment: No Clinical Summary of Care: Electronic Signature(s) Signed: 04/27/2019 5:58:48 PM By: Bruce Deleon, Bruce Deleon Entered By: Bruce Deleon, Bruce Deleon on 04/27/2019 15:43:06 -------------------------------------------------------------------------------- Multi-Disciplinary Care Plan Details Patient Name: Bruce Deleon, Bruce W. 04/27/2019 3:00 Date of Service: PM Medical Record [id number] Number: Patient Account Number: [id number] Date of Birth/Sex: Treating RN: [birth year] (63 y.o. Bruce Deleon) Bruce Deleon Other Clinician: Primary Care Rayann Jolley: Bruce Deleon, Bruce Deleon Treating Lenda KelpStone III, Hoyt Zachery Niswander/Extender: Bruce Deleon, Referring Shaana Acocella: Jacquelin HawkingESTELA Weeks in Treatment: 22 Active Inactive Electronic Signature(s) Signed: 04/27/2019 5:58:48 PM By: Bruce Deleon, Bruce Deleon Entered By: Bruce Deleon, Bruce Deleon on 04/27/2019 15:40:49 -------------------------------------------------------------------------------- Pain Assessment Details Patient Name: Bruce Deleon, Kline W. 04/27/2019 3:00 Date of Service: PM Medical Record [id number] Number: Patient Account Number: [id number] Date of Birth/Sex: Treating RN: [birth year] (63 y.o. Judie PetitM) Yevonne PaxEpps, Carrie Other Clinician: Primary Care Gurinder Toral: Bruce Deleon, Bruce Deleon Treating Lenda KelpStone III, Hoyt Kyion Gautier/Extender: Bruce Deleon, Referring Dinah Lupa: Jacquelin HawkingESTELA Weeks in Treatment: 22 Active Problems Location of Pain Severity and Description of Pain Patient Has Paino No Site Locations Pain Management and Medication Current Pain Management: Electronic Signature(s) Signed: 06/21/2019 3:01:15 PM By: Yevonne PaxEpps, Carrie RN Entered By: Yevonne PaxEpps, Carrie on 04/27/2019  15:22:56 -------------------------------------------------------------------------------- Patient/Caregiver Education Details Patient Name: Bruce Deleon, Bruce W. 10/14/2020andnbsp3:00 Date of Service: PM Medical Record [id number] Number: Patient Account Number: [id number] Date of Birth/Gender: [birth year] (63 y.o. M) Treating RN: Bruce Deleon, Bruce Deleon Primary Care Other Clinician: Philip AspenHERNANDEZ Deleon, Physician: Vernie ShanksESTELA Treating Lenda KelpStone III, Hoyt Physician/Extender: Bruce Deleon, Referring Physician: Jacquelin HawkingESTELA Weeks in Treatment: 22 Education Assessment Education Provided To: Patient and Caregiver Education Topics Provided Wound/Skin Impairment: Handouts: Skin Care Do's and Dont's Methods: Explain/Verbal Responses: Reinforcements needed Electronic  Signature(s) Signed: 04/27/2019 5:58:48 PM By: Bruce Stall Entered By: Bruce Stall on 04/27/2019 15:40:06 -------------------------------------------------------------------------------- Wound Assessment Details Patient Name: Bruce Ricks W. 04/27/2019 3:00 Date of Service: PM Medical Record [id number] Number: Patient Account Number: [id number] Date of Birth/Sex: Treating RN: 1956/03/20 (63 y.o. 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 Wound Status: Healed - Epithelialized Wounding Event: Surgical Injury Comorbid History: Cataracts, Hypertension Date Acquired: 08/06/2018 Weeks Of Treatment: 22 Clustered Wound: No Photos Wound Measurements Length: (cm) 0 % Reduction Width: (cm) 0 % Reduction Depth: (cm) 0 Epitheliali Area: (cm) 0 Tunneling: Volume: (cm) 0 Underminin Wound Description Classification: Full Thickness With Exposed Support Foul Odor Structures  Slough/Fib Wound Thickened Margin: Exudate None Present Amount: Wound Bed Granulation Amount: None Present (0%) Necrotic Amount: None Present (0%) Fascia Expo Fat Layer ( Tendon Expo Muscle Expo Joint Expos Bone Expose After Cleansing: No rino No Exposed Structure sed: No Subcutaneous Tissue) Exposed: No sed: No sed: No ed: No d: No in Area: 100% in Volume: 100% zation: Large (67-100%) No g: No Electronic Signature(s) Signed: 05/03/2019 1:32:19 PM By: Benjaman Kindler EMT/HBOT Signed: 06/21/2019 3:01:15 PM By: Yevonne Pax RN Entered By: Benjaman Kindler on 04/28/2019 10:39:45 -------------------------------------------------------------------------------- Vitals Details Patient Name: Bruce Manis. 04/27/2019 3:00 Date of Service: PM Medical Record [id number] Number: Patient Account Number: [id number] Date of Birth/Sex: Treating RN: [birth year] (63 y.o. Judie Petit) Jettie Pagan, Lyla Son Other Clinician: Philip Aspen, Primary Care Pema Thomure: Bruce Deleon Treating Lenda Kelp Shermar Friedland/Extender: Bruce Aspen, Referring Antoine Fiallos: Jacquelin Hawking in Treatment: 22 Vital Signs Time Taken: 15:22 Temperature (F): 98.5 Height (in): 72 Pulse (bpm): 75 Weight (lbs): 130 Respiratory Rate (breaths/min): 16 Body Mass Index (BMI): 17.6 Blood Pressure (mmHg): 136/56 Reference Range: 80 - 120 mg / dl Electronic Signature(s) Signed: 06/21/2019 3:01:15 PM By: Yevonne Pax RN Entered By: Yevonne Pax on 04/27/2019 15:22:50

## 2019-06-22 ENCOUNTER — Ambulatory Visit (HOSPITAL_BASED_OUTPATIENT_CLINIC_OR_DEPARTMENT_OTHER): Payer: Medicare Other | Admitting: Physician Assistant

## 2019-08-04 ENCOUNTER — Ambulatory Visit: Payer: Medicare Other | Admitting: Orthopaedic Surgery

## 2020-01-27 ENCOUNTER — Ambulatory Visit: Payer: Medicare Other | Attending: Internal Medicine

## 2020-01-27 DIAGNOSIS — Z23 Encounter for immunization: Secondary | ICD-10-CM

## 2020-01-27 NOTE — Progress Notes (Signed)
° °  Covid-19 Vaccination Clinic  Name:  Bruce Deleon    MRN: [id number] DOB: [birth year]  01/27/2020  [name] was observed post Covid-19 immunization for 15 minutes without incident. He was provided with Vaccine Information Sheet and instruction to access the V-Safe system.   [name] was instructed to call 911 with any severe reactions post vaccine:  Difficulty breathing   Swelling of face and throat   A fast heartbeat   A bad rash all over body   Dizziness and weakness   Immunizations Administered    Name Date Dose VIS Date Route   Pfizer COVID-19 Vaccine 01/27/2020  3:06 PM 0.3 mL 09/07/2018 Intramuscular   Manufacturer: [employer], [employer]   Lot: [id number]   NDC: [address]-4

## 2020-02-02 ENCOUNTER — Emergency Department (HOSPITAL_BASED_OUTPATIENT_CLINIC_OR_DEPARTMENT_OTHER)
Admission: EM | Admit: 2020-02-02 | Discharge: 2020-02-02 | Disposition: A | Discharge status: Home / Self Care | Payer: Medicare Other | Attending: Emergency Medicine | Admitting: Emergency Medicine

## 2020-02-02 ENCOUNTER — Encounter (HOSPITAL_BASED_OUTPATIENT_CLINIC_OR_DEPARTMENT_OTHER): Payer: Self-pay | Admitting: *Deleted

## 2020-02-02 ENCOUNTER — Other Ambulatory Visit: Payer: Self-pay

## 2020-02-02 DIAGNOSIS — R040 Epistaxis: Secondary | ICD-10-CM | POA: Diagnosis not present

## 2020-02-02 DIAGNOSIS — I10 Essential (primary) hypertension: Secondary | ICD-10-CM | POA: Diagnosis not present

## 2020-02-02 DIAGNOSIS — Z87891 Personal history of nicotine dependence: Secondary | ICD-10-CM | POA: Diagnosis not present

## 2020-02-02 DIAGNOSIS — Z79899 Other long term (current) drug therapy: Secondary | ICD-10-CM | POA: Diagnosis not present

## 2020-02-02 MED ORDER — SILVER NITRATE-POT NITRATE 75-25 % EX MISC
4.0000 | Freq: Once | CUTANEOUS | Status: DC
  Filled 2020-02-02: qty 40

## 2020-02-02 MED ORDER — OXYMETAZOLINE HCL 0.05 % NA SOLN
1.0000 | Freq: Once | NASAL | Status: AC
  Administered 2020-02-02: 1 via NASAL

## 2020-02-02 MED ORDER — OXYMETAZOLINE HCL 0.05 % NA SOLN
1.0000 | Freq: Once | NASAL | Status: DC
  Filled 2020-02-02: qty 30

## 2020-02-02 NOTE — ED Provider Notes (Signed)
[facility] EMERGENCY DEPARTMENT Provider Note   CSN: [id number] Arrival date & time: 02/02/20  1503     History Chief Complaint  Patient presents with  . Epistaxis    Bruce Deleon is a 64 y.o. male.  HPI  Patient is a 64 year old male with past medical history significant for hypertension who presents today with chief complaint of nosebleed.  He states that he started today with a nosebleed which he states occurred spontaneously but may have had some digital trauma.  He denies any facial trauma, blood thinner use, denies any history of surgeries please located.  Patient was brought in by EMS and have Afrin administered to his nose and gauze packed and transferred to the emergency department.  On my assessment he is not currently bleeding.  He is not sure exactly when the bleeding stopped.  He denies any lightheadedness or dizziness and states he did not lose a significant amount of blood.  Denies any nausea or vomiting.    Past Medical History:  Diagnosis Date  . 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 ALS (amyotrophic lateral sclerosis) (HCC)   . Chronic respiratory insufficiency 06/12/2014  . Essential hypertension 01/25/2013    Patient Active Problem List   Diagnosis Date Noted  . 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 Moderate protein-calorie malnutrition (HCC) 04/13/2019  . Leg wound, left, subsequent encounter 10/20/2018  . S/P BKA (below knee amputation) unilateral, left (HCC) 08/26/2018  . Osteomyelitis (HCC) 08/01/2018  . Left foot and ankle infection 08/01/2018  . Cellulitis of left foot   . Chronic respiratory insufficiency 06/12/2014  . 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 Essential hypertension 01/25/2013    Past Surgical History:  Procedure Laterality Date  . AMPUTATION Left 08/06/2018   Procedure: LEFT BELOW KNEE AMPUTATION;  Surgeon: Kathryne Hitch, MD;  Location: [facility]  ORS;  Service: Orthopedics;  Laterality: Left;  . APPLICATION OF WOUND VAC Left 08/04/2018   Procedure: APPLICATION OF WOUND VAC;  Surgeon: Kathryne Hitch, MD;  Location: [facility] ORS;  Service: Orthopedics;  Laterality: Left;  . I & D EXTREMITY Left 08/01/2018   Procedure: IRRIGATION AND DEBRIDEMENT foot and ankle amputation first and second rays;  Surgeon: Kathryne Hitch, MD;  Location: [facility] ORS;  Service: Orthopedics;  Laterality: Left;  . I & D EXTREMITY Left 08/04/2018   Procedure: REPEAT IRRIGATION AND DEBRIDEMENT OF LEFT ANKLE;  Surgeon: Kathryne Hitch, MD;  Location: [facility] ORS;  Service: Orthopedics;  Laterality: Left;       No family history on file.  Social History   Tobacco Use  . Smoking status: Former Games developer  . Smokeless tobacco: Never Used  Substance Use Topics  . Alcohol use: Not on file  . Drug use: Not on file    Home Medications Prior to Admission medications   Medication Sig Start Date End Date Taking? Authorizing Provider  amLODipine (NORVASC) 5 MG tablet Take 1 tablet (5 mg total) by mouth daily. 10/14/18  Yes Philip Aspen, Limmie Patricia, MD  traMADol (ULTRAM) 50 MG tablet Take 2 tablets (100 mg total) by mouth 3 (three) times daily as needed. 04/13/19 04/12/20  Genice Rouge, MD    Allergies    Patient has no known allergies.  Review of Systems   Review of Systems  Constitutional: Negative for chills and fever.  HENT: Positive for nosebleeds. Negative for congestion.   Respiratory: Negative for shortness of breath.   Cardiovascular:  Negative for chest pain.  Gastrointestinal: Negative for abdominal pain.  Musculoskeletal: Negative for neck pain.    Physical Exam Updated Vital Signs BP (!) 143/94 (BP Location: Left Arm)   Pulse 98   Temp 98.5 F (36.9 C) (Oral)   Resp 18   Ht 6' (1.829 m)   Wt 63.5 kg   SpO2 98%   BMI 18.99 kg/m   Physical Exam Vitals and nursing note reviewed.  Constitutional:      General: He is not in acute  distress.    Appearance: Normal appearance. He is not ill-appearing.  HENT:     Head: Normocephalic and atraumatic.     Nose:     Comments: Right nare with dried blood; packing present which I removed.  Patient forcefully blew nose which removed a large clot.  No bleeding at this time.  Anterior nasal chamber evaluated with speculum no obvious source of prior bleeding.  Will reassess in 20 minutes. Eyes:     General: No scleral icterus.       Right eye: No discharge.        Left eye: No discharge.     Conjunctiva/sclera: Conjunctivae normal.  Pulmonary:     Effort: Pulmonary effort is normal.     Breath sounds: No stridor.  Neurological:     Mental Status: He is alert and oriented to person, place, and time. Mental status is at baseline.     ED Results / Procedures / Treatments   Labs (all labs ordered are listed, but only abnormal results are displayed) Labs Reviewed - No data to display  EKG None  Radiology No results found.  Procedures Procedures (including critical care time)  Medications Ordered in ED Medications  oxymetazoline (AFRIN) 0.05 % nasal spray 1 spray (1 spray Each Nare Given by Other 02/02/20 1838)    ED Course  I have reviewed the triage vital signs and the nursing notes.  Pertinent labs & imaging results that were available during my care of the patient were reviewed by me and considered in my medical decision making (see chart for details).    MDM Rules/Calculators/A&P                          Patient with nosebleed that began today has been intermittent. Completely resolved at my first contact with patient.  I cleaned the dried blood off patient's face and thoroughly investigated the anterior nasal chamber of the right nose with no obvious source of prior bleeding.  Patient discharged however had small amount of bleeding upon discharge and return to the room.  On my reassessment he has scans bleeding.  I provide patient with 1 squirt of Afrin and  compress the area for 1 minute and then reevaluated the nasal chamber which is now devoid of any bleeding.  The bleeding has now completely stopped I am still unable to see any source of the bleeding.  I repeated my nasal examination which is unchanged.  Given how quickly the nosebleed responded to ibuprofen suspect patient will be fine with follow-up with ENT and discharged with Afrin and copious packing supplies as well as nasal clamp to use if he has recurrent bleed.  He will return immediately to the emergency department if he has difficulty controlling the bleed.  I did give him instructions on first test to do however.   Final Clinical Impression(s) / ED Diagnoses Final diagnoses:  Epistaxis  Right-sided epistaxis  Rx / DC Orders ED Discharge Orders    None       Gailen Shelter, [place] 02/03/20 1319    Rolan Bucco, MD 02/03/20 1601

## 2020-02-02 NOTE — ED Triage Notes (Signed)
Nosebleed this am. It is controlled on arrival to triage.

## 2020-02-02 NOTE — Discharge Instructions (Addendum)
Please follow up with ear nose and throat doctor that I recommended you to.  If you continue to have nosebleeds they will be helpful for further treatment.  Please drink plenty of water.  If you have another nosebleed please use the Afrin provided to soak gauze as well as squirt 2-3 squirts of the bleeding nostril and pack your nose with the soaked gauze.  Please apply the clamp that you are provided and wait 20-30 minutes.  If it does not work you can always return to the ED for reevaluation and treatment.

## 2020-02-02 NOTE — ED Notes (Signed)
ED PA wishes to monitor pt for 10 min to ensure nose does not bleed again. Wife in car updated about delay.

## 2020-02-02 NOTE — ED Notes (Signed)
Upon entering pt room for DC, pt nose started to bleed again, ED PA made aware.

## 2020-02-02 NOTE — ED Triage Notes (Addendum)
Per EMS:  Pt having nose bleed since this am.  Bleeding controlled

## 2020-02-07 ENCOUNTER — Other Ambulatory Visit: Payer: Self-pay | Admitting: Internal Medicine

## 2020-02-07 DIAGNOSIS — I10 Essential (primary) hypertension: Secondary | ICD-10-CM

## 2020-04-13 ENCOUNTER — Other Ambulatory Visit: Payer: Self-pay | Admitting: Internal Medicine

## 2020-04-13 DIAGNOSIS — I10 Essential (primary) hypertension: Secondary | ICD-10-CM

## 2020-05-15 ENCOUNTER — Other Ambulatory Visit: Payer: Self-pay | Admitting: Internal Medicine

## 2020-05-15 DIAGNOSIS — I10 Essential (primary) hypertension: Secondary | ICD-10-CM

## 2020-05-18 ENCOUNTER — Other Ambulatory Visit: Payer: Self-pay | Admitting: Internal Medicine

## 2020-05-18 DIAGNOSIS — I10 Essential (primary) hypertension: Secondary | ICD-10-CM

## 2020-05-23 ENCOUNTER — Other Ambulatory Visit: Payer: Self-pay | Admitting: Internal Medicine

## 2020-05-23 DIAGNOSIS — I10 Essential (primary) hypertension: Secondary | ICD-10-CM

## 2020-05-24 ENCOUNTER — Telehealth: Payer: Self-pay | Admitting: Orthopaedic Surgery

## 2020-05-24 NOTE — Telephone Encounter (Signed)
Patient's wife Bruce Deleon called asked for a call back concerning patient's prosthetic leg. Renee asked if she can get a new Rx. The number to contact Bruce Deleon is [phone]

## 2020-05-25 NOTE — Telephone Encounter (Signed)
It is fine for him to have a new prescription for a new prosthetic leg adjustments to his current prosthetic leg.  It should be for either [facility] or [facility] or whoever did his original prosthesis.

## 2020-05-28 NOTE — Telephone Encounter (Signed)
I spoke with the wife and she wanted it faxed to [facility], she also stated she felt like he needed to be seen again. I made an appointment for Monday of next week.

## 2020-05-28 NOTE — Telephone Encounter (Signed)
I have the rx on my desk. Will fax it in the morning

## 2020-05-28 NOTE — Telephone Encounter (Signed)
Lvm for wife to call back to find out where the rx needs to go

## 2020-05-29 NOTE — Telephone Encounter (Signed)
Rx was faxed.

## 2020-06-04 ENCOUNTER — Encounter: Payer: Self-pay | Admitting: Orthopaedic Surgery

## 2020-06-04 ENCOUNTER — Ambulatory Visit (INDEPENDENT_AMBULATORY_CARE_PROVIDER_SITE_OTHER): Payer: Medicare Other | Admitting: Orthopaedic Surgery

## 2020-06-04 DIAGNOSIS — Z89512 Acquired absence of left leg below knee: Secondary | ICD-10-CM | POA: Insufficient documentation

## 2020-06-04 NOTE — Progress Notes (Signed)
The patient has a history of a left below-knee amputation that we did in January 2020.  He did end up having a stump shrinker but he never went to get fitted for a prosthesis.  He feels like he is ready for this now.  He has had no other changes in medical status.  On examination his left BKA stump, his wound is healed nicely.  He lacks full extension by just a few degrees of his left knee and flexes all the way.  I gave him a prescription to take to [facility] to start his fitting process for a below knee prosthesis.  We can eventually get him set up with the amputation clinic and physical therapy upstairs here.  I would like to see him back in 4 weeks to see how he is doing overall.  All questions and concerns were answered and addressed.

## 2020-06-29 ENCOUNTER — Telehealth: Payer: Self-pay | Admitting: Orthopaedic Surgery

## 2020-06-29 NOTE — Telephone Encounter (Signed)
Patient's wife Luster Landsberg called advised patient want to wait until he get his leg to see Dr Magnus Ivan. Renee said [place] will get his leg sometime in January. The number to contact Luster Landsberg is [phone]

## 2020-06-29 NOTE — Telephone Encounter (Signed)
Has appt monday

## 2020-07-02 ENCOUNTER — Ambulatory Visit: Payer: Medicare Other | Admitting: Orthopaedic Surgery

## 2020-07-11 ENCOUNTER — Other Ambulatory Visit: Payer: Self-pay

## 2020-07-11 DIAGNOSIS — Z89512 Acquired absence of left leg below knee: Secondary | ICD-10-CM

## 2020-07-18 ENCOUNTER — Encounter: Payer: Medicare Other | Admitting: Physical Therapy

## 2020-07-26 IMAGING — DX DG ANKLE COMPLETE 3+V*L*
3 series · 3 of 3 positions shown · non-contrast
Comparison: None.

CLINICAL DATA: Left foot wound.

EXAM:
LEFT ANKLE COMPLETE - 3+ VIEW

[ankle ap]
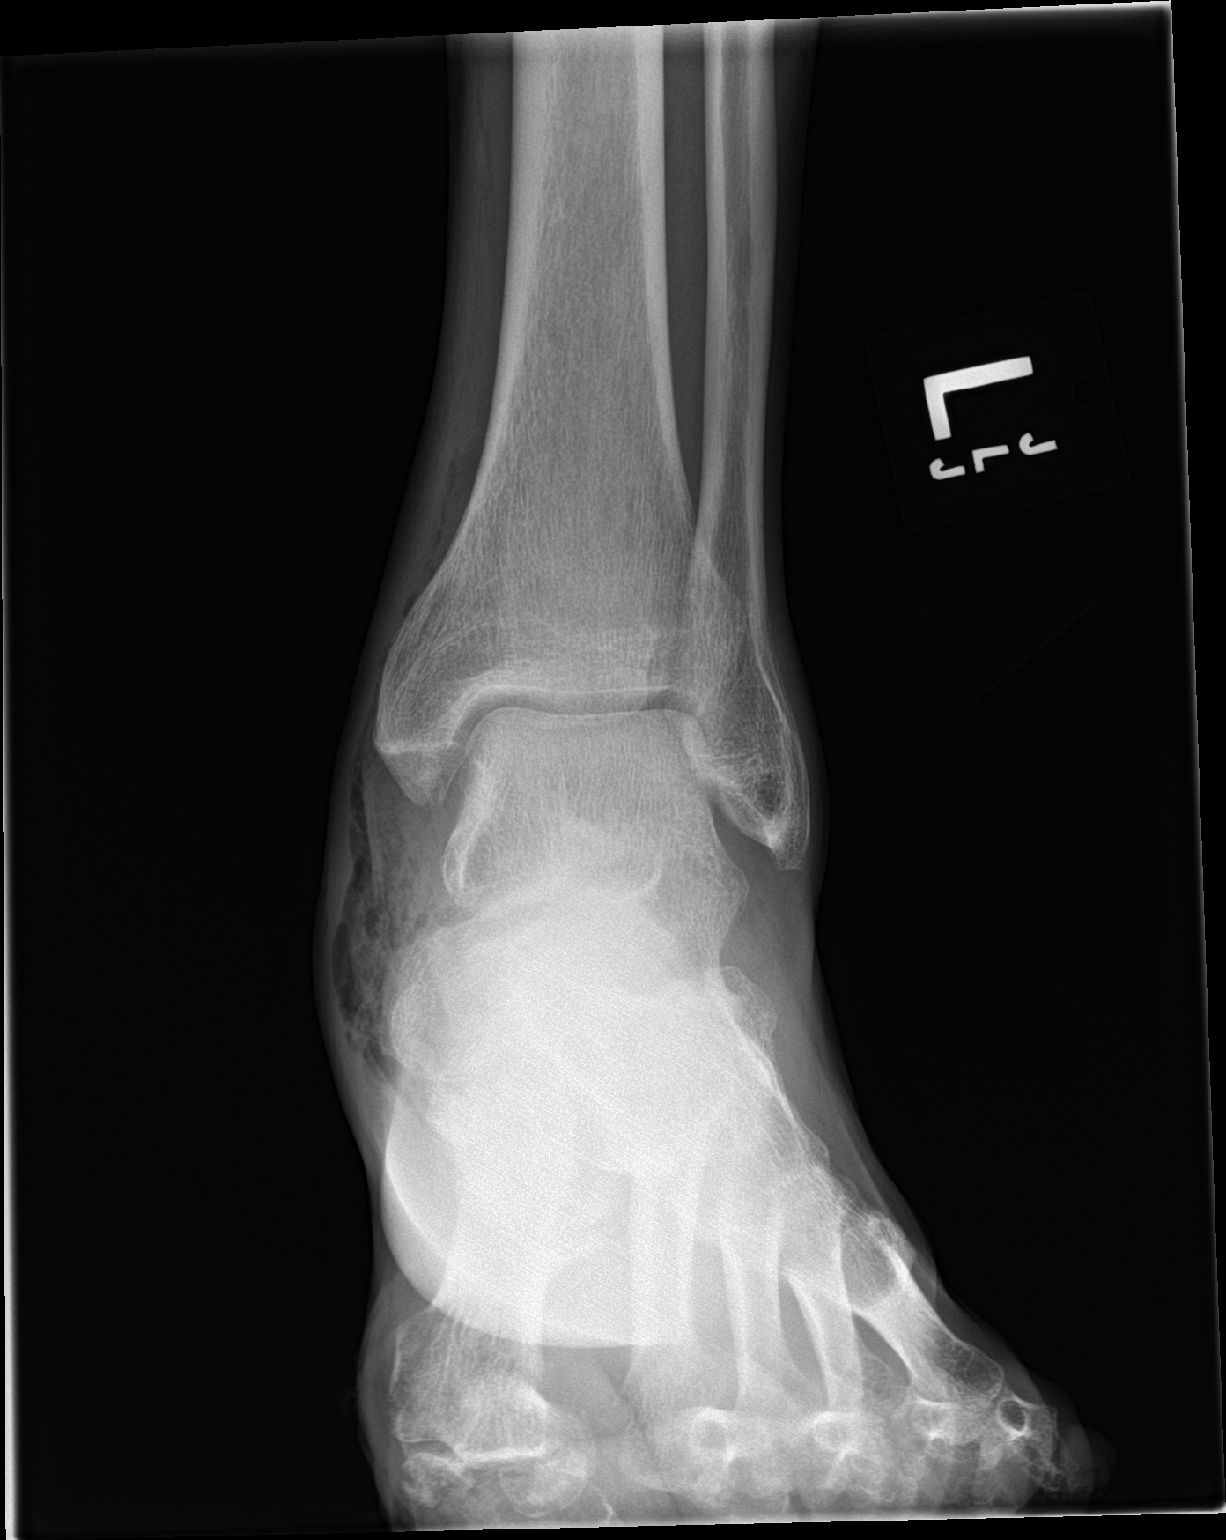

[ankle obl]
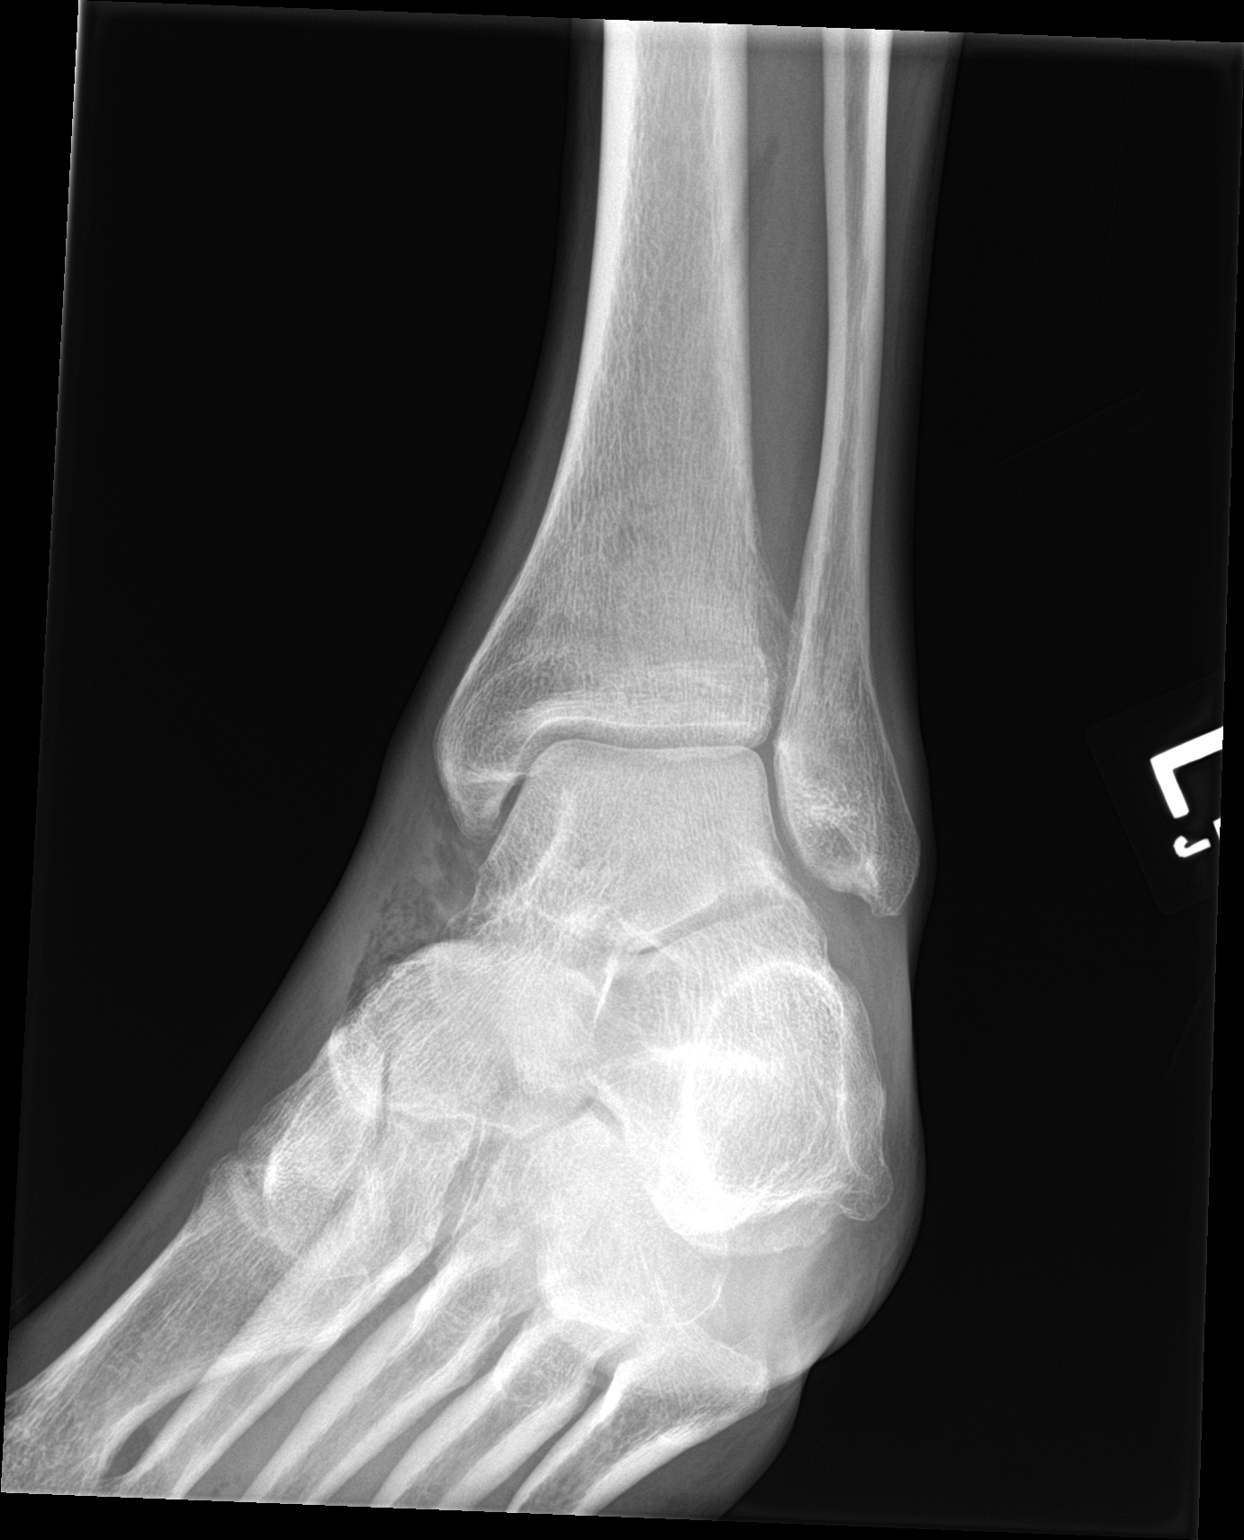

[ankle lat]
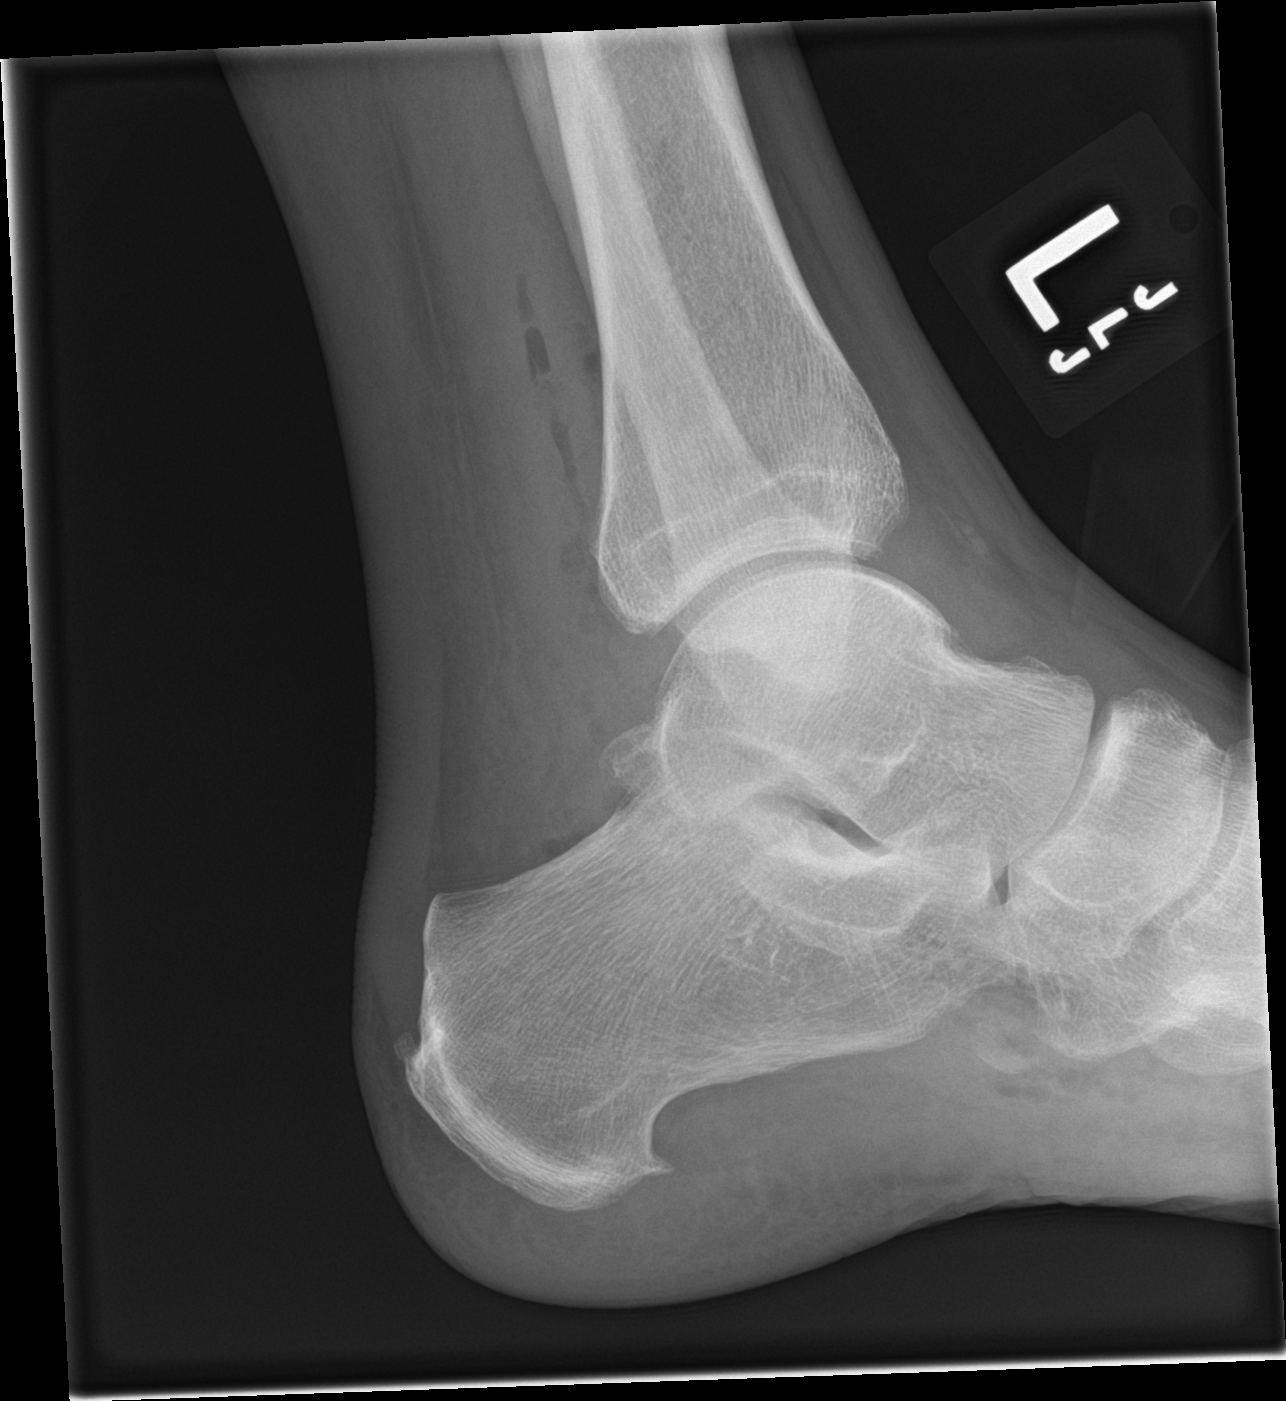

[3 of 3 positions shown; findings below may reference images not displayed]

FINDINGS: Soft tissue air identified in the medial left ankle and medial left
forefoot. There is question osteopenia of the distal aspect of the
talus.
IMPRESSION: Soft tissue air identified in the medial left ankle medial left
forefoot. There is questioned osteopenia in the distal aspect of the
talus suspicious for osteomyelitis.

## 2020-08-01 ENCOUNTER — Encounter: Payer: Medicare Other | Admitting: Physical Therapy

## 2020-08-22 ENCOUNTER — Other Ambulatory Visit: Payer: Self-pay

## 2020-08-22 ENCOUNTER — Ambulatory Visit: Payer: Medicare Other | Admitting: Physical Therapy

## 2020-08-22 ENCOUNTER — Encounter: Payer: Self-pay | Admitting: Physical Therapy

## 2020-08-22 DIAGNOSIS — M25662 Stiffness of left knee, not elsewhere classified: Secondary | ICD-10-CM

## 2020-08-22 DIAGNOSIS — R2681 Unsteadiness on feet: Secondary | ICD-10-CM | POA: Diagnosis not present

## 2020-08-22 DIAGNOSIS — M25661 Stiffness of right knee, not elsewhere classified: Secondary | ICD-10-CM

## 2020-08-22 DIAGNOSIS — R2689 Other abnormalities of gait and mobility: Secondary | ICD-10-CM

## 2020-08-22 DIAGNOSIS — M6281 Muscle weakness (generalized): Secondary | ICD-10-CM | POA: Diagnosis not present

## 2020-08-22 NOTE — Therapy (Signed)
[facility] [address] [place], [place], [address] Phone: [phone]   Fax:  [phone]  Physical Therapy Evaluation  Patient Details  Name: Bruce Deleon MRN: [id number] Date of Birth: 06/08/1956 Referring Provider (PT): Doneen Poisson, MD   Encounter Date: 08/22/2020   PT End of Session - 08/22/20 2147    Visit Number 1    Number of Visits 50    Date for PT Re-Evaluation 11/15/20    Authorization Type [employer] Medicare    PT Start Time 1430    PT Stop Time 1522    PT Time Calculation (min) 52 min    Equipment Utilized During Treatment Gait belt    Activity Tolerance Patient tolerated treatment well;Patient limited by fatigue    Behavior During Therapy [facility] for tasks assessed/performed           Past Medical History:  Diagnosis Date  . 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 ALS (amyotrophic lateral sclerosis) (HCC)   . Chronic respiratory insufficiency 06/12/2014  . Essential hypertension 01/25/2013    Past Surgical History:  Procedure Laterality Date  . AMPUTATION Left 08/06/2018   Procedure: LEFT BELOW KNEE AMPUTATION;  Surgeon: Kathryne Hitch, MD;  Location: [facility];  Service: Orthopedics;  Laterality: Left;  . APPLICATION OF WOUND VAC Left 08/04/2018   Procedure: APPLICATION OF WOUND VAC;  Surgeon: Kathryne Hitch, MD;  Location: [facility];  Service: Orthopedics;  Laterality: Left;  . I & D EXTREMITY Left 08/01/2018   Procedure: IRRIGATION AND DEBRIDEMENT foot and ankle amputation first and second rays;  Surgeon: Kathryne Hitch, MD;  Location: [facility];  Service: Orthopedics;  Laterality: Left;  . I & D EXTREMITY Left 08/04/2018   Procedure: REPEAT IRRIGATION AND DEBRIDEMENT OF LEFT ANKLE;  Surgeon: Kathryne Hitch, MD;  Location: [facility];  Service: Orthopedics;  Laterality: Left;    There were no vitals filed for this visit.    Subjective Assessment - 08/22/20 1438    Subjective  This 64yo male was referred on 07/11/2020 to PT by Doneen Poisson, MD with [id number] (ICD-10-CM) - History of left below knee amputation (HCC). He underwent a left BKA on 08/06/2018. His limb did not heal for >1 year.  He has history of ALS for 12 years wtih significant weakness which increased with deconditioning with 2 years of w/c bound.  He received prosthesis ~3 weeks ago.He received his prosthesis 07/11/2020 but has very limited wear.    Patient is accompained by: Family member   [name], wife   Pertinent History left TTA, ALS ~48yrs with rt side weakness > lt side, HTN,    Patient Stated Goals to use prosthesis to get out of w/c, drive,    Currently in Pain? No/denies              [facility] PT Assessment - 08/22/20 1430      Assessment   Medical Diagnosis Left Transtibial Amputation    Referring Provider (PT) Doneen Poisson, MD    Onset Date/Surgical Date 07/11/20   prosthesis delivery   Hand Dominance Right    Prior Therapy some ALS clinic, inpatient rehab for 3 weeks & HHPT 2 weeks.      Precautions   Precautions Fall      Balance Screen   Has the patient fallen in the past 6 months No    Has the patient had a decrease in activity level because of a fear of falling?  No  uses w/c   Is the patient reluctant to leave their home because of a fear of falling?  No   uses w/c     Home Environment   Living Environment Private residence    Living Arrangements Spouse/significant other   cat   Type of Home House    Home Access Ramped entrance   2nd entrance 2 steps left rail   Home Layout One level   single step into [employer] - 4 wheels;Cane - single point;Bedside commode;Wheelchair - manual      Prior Function   Level of Independence Independent;Independent with household mobility without device;Independent with community mobility with device   household no device & community cane or rollator walker   Vocation On disability    Leisure getting out  of house      Posture/Postural Control   Posture/Postural Control Postural limitations    Postural Limitations Rounded Shoulders;Forward head;Flexed trunk;Weight shift left      ROM / Strength   AROM / PROM / Strength PROM;Strength      PROM   Right Knee Extension -19    Left Knee Extension -36      Strength   Overall Strength Deficits    Overall Strength Comments BUE weakness RUE > LUE    Strength Assessment Site Hip;Knee;Ankle    Right Shoulder Flexion 3-/5    Left Shoulder Flexion 3+/5    Right Elbow Flexion 3+/5    Right Elbow Extension 3-/5    Left Elbow Flexion 4-/5    Left Elbow Extension 3+/5    Right Hand Gross Grasp Impaired    Left Hand Gross Grasp Functional    Right Hip Flexion 3/5    Right Hip Extension 2/5    Right Hip ABduction 2/5    Left Hip Flexion 4/5    Left Hip Extension 3-/5    Left Hip ABduction 3+/5    Right/Left Knee Right;Left    Right Knee Flexion 3-/5    Right Knee Extension 3-/5    Left Knee Flexion 3/5    Left Knee Extension 4-/5    Right Ankle Dorsiflexion 4/5      Transfers   Transfers Sit to Stand;Stand to Sit    Sit to Stand 3: Mod assist;With upper extremity assist;With armrests;From chair/3-in-1;Other (comment)   to RW   Stand to Sit 4: Min assist;With upper extremity assist;With armrests;To chair/3-in-1;Other (comment)   from RW     Ambulation/Gait   Ambulation/Gait Yes    Ambulation/Gait Assistance 2: Max assist    Ambulation/Gait Assistance Details after gait HR 126 SpO2 95%  5 min seated rest recovered to HR 90 SpO2 98%   Excessive UE weight bearing on RW with only parital weight on prosthesis.    Ambulation Distance (Feet) 8 Feet    Assistive device Rolling walker;Prosthesis    Gait Pattern Step-to pattern;Decreased step length - right;Decreased stance time - left;Decreased stride length;Decreased weight shift to left;Right flexed knee in stance;Left flexed knee in stance;Antalgic;Lateral hip instability;Trunk flexed;Abducted  - left;Poor foot clearance - right    Ambulation Surface Level;Indoor      Balance   Balance Assessed Yes      Static Standing Balance   Static Standing - Balance Support Bilateral upper extremity supported   RW support   Static Standing - Level of Assistance 3: Mod assist    Static Standing - Comment/# of Minutes 2 minutes      Dynamic Standing Balance  Dynamic Standing - Balance Support Bilateral upper extremity supported;During functional activity   scan BUE on RW,  reaching with each UE   Dynamic Standing - Level of Assistance 3: Mod assist    Dynamic Standing - Balance Activities Head turns;Head nods;Reaching for objects    Dynamic Standing - Comments BUE support on RW looking to side only with modA, Reaches from RW hand grip to anterior RW (within [place] from UE support to UE support area) modA and reaching RUE to target anteriorly length of his arm (from UE support of RW to open chain) with maxA.           Prosthetics Assessment - 08/22/20 1430      Prosthetics   Prosthetic Care Dependent with Skin check;Residual limb care;Prosthetic cleaning;Ply sock cleaning;Correct ply sock adjustment;Proper wear schedule/adjustment;Proper weight-bearing schedule/adjustment    Donning prosthesis  Mod assist    Current prosthetic wear tolerance (days/week)  ~20 of 42 days since delivery but reports none in last week    Current prosthetic wear tolerance (#hours/day)  up to 45 minutes    Current prosthetic weight-bearing tolerance (hours/day)  He reported some discomfort with standing & gait with prosthesis    Edema none    Residual limb condition  No open areas, dry distal skin scaly, scar invaginated & adhered at distal tibia,  cylinderical shape    Prosthesis Description silicon liner with pin lock suspension, secondary pelite foam interface,                     Objective measurements completed on examination: See above findings.       [facility] Adult PT  Treatment/Exercise - 08/22/20 1430      Prosthetics   Prosthetic Care Comments  PT reommended wear 2hrs 2x/day sitting only with prosthesis supported on floor or recliner footrest    Education Provided Skin check;Residual limb care;Prosthetic cleaning;Correct ply sock adjustment;Proper Donning;Proper wear schedule/adjustment    Person(s) Educated Patient;Spouse    Education Method Explanation;Demonstration;Tactile cues;Verbal cues    Education Method Verbalized understanding;Tactile cues required;Needs further instruction;Verbal cues required                    PT Short Term Goals - 08/22/20 2217      PT SHORT TERM GOAL #1   Title Patient & wife verbalize proper cleaning, skin checking & donning of prosthesis.    Time 4    Period Weeks    Status New    Target Date 09/20/20      PT SHORT TERM GOAL #2   Title Patient tolerates prosthesis wear >/= 5 days / wk for >/= 6 hours total / day without skin issues.    Time 4    Period Weeks    Status New    Target Date 09/20/20      PT SHORT TERM GOAL #3   Title Patient & wife demonstrate understanding of initial HEP.    Time 4    Period Weeks    Status New    Target Date 09/20/20      PT SHORT TERM GOAL #4   Title w/c to level surface mat table scooting transfer using prosthesis with modA    Time 4    Period Weeks    Status New    Target Date 09/20/20             PT Long Term Goals - 08/22/20 2212      PT LONG  TERM GOAL #1   Title Patient & wife demonstrate and verbalize understanding of proper prosthetic care to enable safe utililzation of prosthesis.    Time 6    Period Months    Status New    Target Date 02/13/21      PT LONG TERM GOAL #2   Title Patient tolerates prosthesis wear >90% of awake hours without skin or limb pain issues to enable function throughout his day.    Time 6    Period Months    Status New    Target Date 02/13/21      PT LONG TERM GOAL #3   Title Patient able to transfer w/c to  chair or toilet with prosthesis modified independent.    Time 6    Period Months    Status New    Target Date 02/13/21      PT LONG TERM GOAL #4   Title Sit to/from stand transfers and standing balance for ADLs with RW support wtih prosthesis with wife's minimal assist or supervision.    Time 6    Period Months    Status New    Target Date 02/13/21      PT LONG TERM GOAL #5   Title Patient ambulates 6' with RW & prosthesis with minimal assist.    Time 6    Period Months    Status New    Target Date 02/13/21                  Plan - 08/22/20 2154    Clinical Impression Statement This 64yo male underwent a left Transtibial Amputation 08/06/2018 with >1 year to heal residual limb. He has history of ALS with significant weakness right side > left side of body.  With 2 year sedentary w/c bound mobility he has excessive deconditioning. He has bilateral knee flexion contractures.  He received his first prosthesis 07/11/2020 but weather over last few weeks has limited attendance to PT evaluation.  His wife and he are dependent in proper prosthetic care. He has worn prosthesis <50% of days since delivery and only up to 45 minutes which limits functional potential during his awake hours.  He requires moderate assist for sit to/from stand transfers w/c to RW and static standing balance. Patient required maximal assistance to ambulate 8' only with RW with only partial weight on prosthesis. His heart rate increased from 90bpm to 126bpm with 8' gait. Patient would benefit from skilled PT to improve safety & mobility with his prosthesis. Due to extent of deficits his process will be slow & take increased time.    Personal Factors and Comorbidities Fitness;Time since onset of injury/illness/exacerbation;Comorbidity 2    Comorbidities ALS, HTN    Examination-Activity Limitations Lift;Locomotion Level;Stairs;Stand;Transfers    Examination-Participation Restrictions Community Activity     Stability/Clinical Decision Making Evolving/Moderate complexity    Clinical Decision Making Moderate    Rehab Potential Good    PT Frequency 2x / week    PT Duration Other (comment)   6 months   PT Treatment/Interventions ADLs/Self Care Home Management;DME Instruction;Gait training;Stair training;Functional mobility training;Therapeutic activities;Therapeutic exercise;Balance training;Neuromuscular re-education;Patient/family education;Prosthetic Training;Manual techniques;Scar mobilization;Passive range of motion    PT Next Visit Plan review prosthetic care, work on scooting / squat pivot transfers using prosthesis. establish HEP seated & supine    Consulted and Agree with Plan of Care Patient;Family member/caregiver    Family Member Consulted wife, Bruce Deleon           Patient will  benefit from skilled therapeutic intervention in order to improve the following deficits and impairments:  Abnormal gait,Cardiopulmonary status limiting activity,Decreased activity tolerance,Decreased balance,Decreased endurance,Decreased knowledge of use of DME,Decreased mobility,Decreased range of motion,Decreased skin integrity,Decreased scar mobility,Decreased strength,Difficulty walking,Impaired flexibility,Impaired UE functional use,Postural dysfunction,Prosthetic Dependency  Visit Diagnosis: Muscle weakness (generalized)  Unsteadiness on feet  Other abnormalities of gait and mobility  Stiffness of right knee, not elsewhere classified  Stiffness of left knee, not elsewhere classified     Problem List Patient Active Problem List   Diagnosis Date Noted  . History of left below knee amputation (HCC) 06/04/2020  . 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 Moderate protein-calorie malnutrition (HCC) 04/13/2019  . Leg wound, left, subsequent encounter 10/20/2018  . S/P BKA (below knee amputation) unilateral, left (HCC) 08/26/2018  . Osteomyelitis (HCC) 08/01/2018  .  Left foot and ankle infection 08/01/2018  . Cellulitis of left foot   . Chronic respiratory insufficiency 06/12/2014  . 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 Essential hypertension 01/25/2013    Vladimir Faster PT, DPT 08/22/2020, 10:21 PM  [facility] [address] [place], [place], [address] Phone: [phone]   Fax:  [phone]  Name: Bruce Deleon MRN: [id number] Date of Birth: 1956/04/20

## 2020-08-27 ENCOUNTER — Encounter: Payer: Medicare Other | Admitting: Physical Therapy

## 2020-08-28 ENCOUNTER — Ambulatory Visit: Payer: Medicare Other | Admitting: Physical Therapy

## 2020-08-28 ENCOUNTER — Other Ambulatory Visit: Payer: Self-pay

## 2020-08-28 ENCOUNTER — Encounter: Payer: Self-pay | Admitting: Physical Therapy

## 2020-08-28 DIAGNOSIS — M6281 Muscle weakness (generalized): Secondary | ICD-10-CM | POA: Diagnosis not present

## 2020-08-28 DIAGNOSIS — M25661 Stiffness of right knee, not elsewhere classified: Secondary | ICD-10-CM | POA: Diagnosis not present

## 2020-08-28 DIAGNOSIS — R2689 Other abnormalities of gait and mobility: Secondary | ICD-10-CM

## 2020-08-28 DIAGNOSIS — R2681 Unsteadiness on feet: Secondary | ICD-10-CM | POA: Diagnosis not present

## 2020-08-28 DIAGNOSIS — M25662 Stiffness of left knee, not elsewhere classified: Secondary | ICD-10-CM

## 2020-08-28 NOTE — Patient Instructions (Signed)
Access Code: [email or web address] URL: [email or web address] Date: 08/28/2020 Prepared by: Vladimir Faster  Exercises Sit to Stand with Counter Support - 1-2 x daily - 7 x weekly - 1 sets - 5 reps - hold Seated Hamstring Stretch with Chair - 2 x daily - 7 x weekly - 1 sets - 1 reps - 5 minutes hold Seated Hip Adduction Isometrics with Ball - 1 x daily - 7 x weekly - 1 sets - 15 reps - 5 seconds hold Seated Hip Abduction with Resistance - 1 x daily - 7 x weekly - 1 sets - 15 reps - 5 seconds hold Seated Knee Flexion Extension AROM - 1 x daily - 7 x weekly - 1 sets - 15 reps - 5 seconds hold

## 2020-08-28 NOTE — Therapy (Signed)
[facility] [address] [place], [place], [address] Phone: [phone]   Fax:  [phone]  Physical Therapy Treatment  Patient Details  Name: Bruce Deleon MRN: [id number] Date of Birth: 1955-08-28 Referring Provider (PT): Doneen Poisson, MD   Encounter Date: 08/28/2020   PT End of Session - 08/28/20 1527    Visit Number 2    Number of Visits 50    Date for PT Re-Evaluation 11/15/20    Authorization Type [employer]HC Medicare    PT Start Time 1527    PT Stop Time 1606    PT Time Calculation (min) 39 min    Equipment Utilized During Treatment Gait belt    Activity Tolerance Patient tolerated treatment well;Patient limited by fatigue    Behavior During Therapy [facility] for tasks assessed/performed           Past Medical History:  Diagnosis Date  . 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 ALS (amyotrophic lateral sclerosis) (HCC)   . Chronic respiratory insufficiency 06/12/2014  . Essential hypertension 01/25/2013    Past Surgical History:  Procedure Laterality Date  . AMPUTATION Left 08/06/2018   Procedure: LEFT BELOW KNEE AMPUTATION;  Surgeon: Kathryne Hitch, MD;  Location: [facility];  Service: Orthopedics;  Laterality: Left;  . APPLICATION OF WOUND VAC Left 08/04/2018   Procedure: APPLICATION OF WOUND VAC;  Surgeon: Kathryne Hitch, MD;  Location: [facility];  Service: Orthopedics;  Laterality: Left;  . I & D EXTREMITY Left 08/01/2018   Procedure: IRRIGATION AND DEBRIDEMENT foot and ankle amputation first and second rays;  Surgeon: Kathryne Hitch, MD;  Location: [facility];  Service: Orthopedics;  Laterality: Left;  . I & D EXTREMITY Left 08/04/2018   Procedure: REPEAT IRRIGATION AND DEBRIDEMENT OF LEFT ANKLE;  Surgeon: Kathryne Hitch, MD;  Location: [facility];  Service: Orthopedics;  Laterality: Left;    There were no vitals filed for this visit.   Subjective Assessment - 08/28/20 1527    Subjective He  has been wearing the prosthesis 2hrs 2x/day as PT recommended without issues.    Patient is accompained by: Family member   [name], wife   Pertinent History left TTA, ALS ~76yrs with rt side weakness > lt side, HTN,    Patient Stated Goals to use prosthesis to get out of w/c, drive,    Currently in Pain? No/denies               Prosthetics Assessment - 08/28/20 1527      Prosthetics   Donning prosthesis  Supervision    Doffing prosthesis  Supervision                        [facility] Adult PT Treatment/Exercise - 08/28/20 1527      Transfers   Transfers Sit to Stand;Stand to Sit;Squat Pivot Transfers    Sit to Stand 5: Supervision;With upper extremity assist;With armrests;From chair/3-in-1;Other (comment)   to sink   Stand to Sit 5: Supervision;With upper extremity assist;With armrests;To chair/3-in-1   from sink   Scientist, clinical (histocompatibility and immunogenetics) Transfers 5: Supervision    Squat Pivot Transfer Details (indicate cue type and reason) w/c <> mat 1st time with armrest removed, 2nd time over armrest to right & to left with cues on technique & wt shifting forward over prosthesis.   Car transfer with verbal cues on technique including using prosthesis. Pt return demo with wife guarding safely.      Neuro Re-ed  Neuro Re-ed Details  standing at sink for 1 minute 3 reps with tactile & verbal cues for pelvic weight shift to midline      Prosthetics   Prosthetic Care Comments  Increase wear to 3hrs 2x/day.    Current prosthetic wear tolerance (days/week)  daily since PT evaluation    Current prosthetic wear tolerance (#hours/day)  2 hrs 2x/day except one day only 1x/day    Residual limb condition  No open areas, dry distal skin scaly, scar invaginated & adhered at distal tibia,  cylinderical shape    Education Provided Proper wear schedule/adjustment    Person(s) Educated Patient;Spouse    Education Method Explanation;Verbal cues    Education Method Verbalized understanding;Verbal cues  required;Needs further instruction                  PT Education - 08/28/20 1620    Education Details HEP Medbridge Access Code: [employer]    Person(s) Educated Patient    Methods Explanation;Demonstration;Tactile cues;Verbal cues;Handout    Comprehension Verbalized understanding;Returned demonstration;Verbal cues required;Tactile cues required;Need further instruction            PT Short Term Goals - 08/22/20 2217      PT SHORT TERM GOAL #1   Title Patient & wife verbalize proper cleaning, skin checking & donning of prosthesis.    Time 4    Period Weeks    Status New    Target Date 09/20/20      PT SHORT TERM GOAL #2   Title Patient tolerates prosthesis wear >/= 5 days / wk for >/= 6 hours total / day without skin issues.    Time 4    Period Weeks    Status New    Target Date 09/20/20      PT SHORT TERM GOAL #3   Title Patient & wife demonstrate understanding of initial HEP.    Time 4    Period Weeks    Status New    Target Date 09/20/20      PT SHORT TERM GOAL #4   Title w/c to level surface mat table scooting transfer using prosthesis with modA    Time 4    Period Weeks    Status New    Target Date 09/20/20             PT Long Term Goals - 08/22/20 2212      PT LONG TERM GOAL #1   Title Patient & wife demonstrate and verbalize understanding of proper prosthetic care to enable safe utililzation of prosthesis.    Time 6    Period Months    Status New    Target Date 02/13/21      PT LONG TERM GOAL #2   Title Patient tolerates prosthesis wear >90% of awake hours without skin or limb pain issues to enable function throughout his day.    Time 6    Period Months    Status New    Target Date 02/13/21      PT LONG TERM GOAL #3   Title Patient able to transfer w/c to chair or toilet with prosthesis modified independent.    Time 6    Period Months    Status New    Target Date 02/13/21      PT LONG TERM GOAL #4   Title Sit to/from stand transfers  and standing balance for ADLs with RW support wtih prosthesis with wife's minimal assist or supervision.    Time 6  Period Months    Status New    Target Date 02/13/21      PT LONG TERM GOAL #5   Title Patient ambulates 86' with RW & prosthesis with minimal assist.    Time 6    Period Months    Status New    Target Date 02/13/21                 Plan - 08/28/20 1528    Clinical Impression Statement PT set up initial HEP and he appears to understand it.  He improved squat-pivot transfers including w/c <> car.    Personal Factors and Comorbidities Fitness;Time since onset of injury/illness/exacerbation;Comorbidity 2    Comorbidities ALS, HTN    Examination-Activity Limitations Lift;Locomotion Level;Stairs;Stand;Transfers    Examination-Participation Restrictions Community Activity    Stability/Clinical Decision Making Evolving/Moderate complexity    Rehab Potential Good    PT Frequency 2x / week    PT Duration Other (comment)   6 months   PT Treatment/Interventions ADLs/Self Care Home Management;DME Instruction;Gait training;Stair training;Functional mobility training;Therapeutic activities;Therapeutic exercise;Balance training;Neuromuscular re-education;Patient/family education;Prosthetic Training;Manual techniques;Scar mobilization;Passive range of motion    PT Next Visit Plan review prosthetic care, work on standing balance,    Consulted and Agree with Plan of Care Patient;Family member/caregiver    Family Member Consulted wife, Vilas Edgerly           Patient will benefit from skilled therapeutic intervention in order to improve the following deficits and impairments:  Abnormal gait,Cardiopulmonary status limiting activity,Decreased activity tolerance,Decreased balance,Decreased endurance,Decreased knowledge of use of DME,Decreased mobility,Decreased range of motion,Decreased skin integrity,Decreased scar mobility,Decreased strength,Difficulty walking,Impaired  flexibility,Impaired UE functional use,Postural dysfunction,Prosthetic Dependency  Visit Diagnosis: Muscle weakness (generalized)  Unsteadiness on feet  Other abnormalities of gait and mobility  Stiffness of right knee, not elsewhere classified  Stiffness of left knee, not elsewhere classified     Problem List Patient Active Problem List   Diagnosis Date Noted  . History of left below knee amputation (HCC) 06/04/2020  . 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 Moderate protein-calorie malnutrition (HCC) 04/13/2019  . Leg wound, left, subsequent encounter 10/20/2018  . S/P BKA (below knee amputation) unilateral, left (HCC) 08/26/2018  . Osteomyelitis (HCC) 08/01/2018  . Left foot and ankle infection 08/01/2018  . Cellulitis of left foot   . Chronic respiratory insufficiency 06/12/2014  . 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 Essential hypertension 01/25/2013    Vladimir Faster, PT, DPT  08/28/2020, 4:58 PM  [facility] [address] [place], [place], [address] Phone: [phone]   Fax:  [phone]  Name: Bruce Deleon MRN: [id number] Date of Birth: 04-08-1956

## 2020-08-29 ENCOUNTER — Ambulatory Visit (INDEPENDENT_AMBULATORY_CARE_PROVIDER_SITE_OTHER): Payer: Medicare Other | Admitting: Physical Therapy

## 2020-08-29 ENCOUNTER — Encounter: Payer: Self-pay | Admitting: Physical Therapy

## 2020-08-29 DIAGNOSIS — R2681 Unsteadiness on feet: Secondary | ICD-10-CM | POA: Diagnosis not present

## 2020-08-29 DIAGNOSIS — M25661 Stiffness of right knee, not elsewhere classified: Secondary | ICD-10-CM

## 2020-08-29 DIAGNOSIS — M6281 Muscle weakness (generalized): Secondary | ICD-10-CM | POA: Diagnosis not present

## 2020-08-29 DIAGNOSIS — M25662 Stiffness of left knee, not elsewhere classified: Secondary | ICD-10-CM

## 2020-08-29 DIAGNOSIS — R2689 Other abnormalities of gait and mobility: Secondary | ICD-10-CM | POA: Diagnosis not present

## 2020-08-29 NOTE — Therapy (Signed)
[facility] Physical Therapy [address] [place], [place], [address] Phone: [phone]   Fax:  [phone]  Physical Therapy Treatment  Patient Details  Name: Bruce Deleon MRN: [id number] Date of Birth: 09-Jul-1956 Referring Provider (PT): Doneen Poisson, MD   Encounter Date: 08/29/2020   PT End of Session - 08/29/20 1433    Visit Number 3    Number of Visits 50    Date for PT Re-Evaluation 11/15/20    Authorization Type [employer] Medicare    PT Start Time 1430    PT Stop Time 1512    PT Time Calculation (min) 42 min    Equipment Utilized During Treatment Gait belt    Activity Tolerance Patient tolerated treatment well;Patient limited by fatigue    Behavior During Therapy [facility] for tasks assessed/performed           Past Medical History:  Diagnosis Date  . 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 ALS (amyotrophic lateral sclerosis) (HCC)   . Chronic respiratory insufficiency 06/12/2014  . Essential hypertension 01/25/2013    Past Surgical History:  Procedure Laterality Date  . AMPUTATION Left 08/06/2018   Procedure: LEFT BELOW KNEE AMPUTATION;  Surgeon: Kathryne Hitch, MD;  Location: [facility];  Service: Orthopedics;  Laterality: Left;  . APPLICATION OF WOUND VAC Left 08/04/2018   Procedure: APPLICATION OF WOUND VAC;  Surgeon: Kathryne Hitch, MD;  Location: [facility];  Service: Orthopedics;  Laterality: Left;  . I & D EXTREMITY Left 08/01/2018   Procedure: IRRIGATION AND DEBRIDEMENT foot and ankle amputation first and second rays;  Surgeon: Kathryne Hitch, MD;  Location: [facility];  Service: Orthopedics;  Laterality: Left;  . I & D EXTREMITY Left 08/04/2018   Procedure: REPEAT IRRIGATION AND DEBRIDEMENT OF LEFT ANKLE;  Surgeon: Kathryne Hitch, MD;  Location: [facility];  Service: Orthopedics;  Laterality: Left;    There were no vitals filed for this visit.   Subjective Assessment - 08/29/20 1430    Subjective He  wore prosthesis 3hrs 2x/day yesterday without issues.  He was able to use prosthesis to transfer in/out car with wife's supervision.    Patient is accompained by: Family member   [name], wife   Pertinent History left TTA, ALS ~2yrs with rt side weakness > lt side, HTN,    Patient Stated Goals to use prosthesis to get out of w/c, drive,    Currently in Pain? No/denies                             [facility] Adult PT Treatment/Exercise - 08/29/20 1430      Transfers   Transfers Sit to Stand;Stand to Sit;Squat Pivot Transfers    Sit to Stand 5: Supervision;With upper extremity assist;With armrests;From chair/3-in-1;Other (comment)   to sink or pulling on //bars   Stand to Sit 5: Supervision;With upper extremity assist;With armrests;To chair/3-in-1   from sink or using //bars   Squat Pivot Transfers 5: Supervision;With upper extremity assistance      Neuro Re-ed    Neuro Re-ed Details  standing at sink for 2 minutes with tactile & verbal cues for pelvic weight shift to midline, cues to use socket interface for proprioception with weight shifts right - midline - left - midline;     Sitting on 24" bar stool with both feet supported on floor, //bars close to for safety with UEs in lap -  trunk rotation, posterior lean with  recovery, anterior lean with recovery, lateral lean, alternating LE extension placing heel on floor and raising UEs overhead  10 reps ea.      Exercises   Exercises Knee/Hip      Knee/Hip Exercises: Aerobic   Nustep Seat 10 level 4 for 8 min      Prosthetics   Prosthetic Care Comments  PT began instruction in adjusting ply socks and distal tibia pain may be related to too few ply socks.  PT issued sample 3-ply in clinic which pt reported decreased limb pain.  He is to bring his ply socks to next appt.    Current prosthetic wear tolerance (days/week)  daily since PT evaluation    Current prosthetic wear tolerance (#hours/day)  3 hrs 2x/day started 08/28/2020     Residual limb condition  No open areas, dry distal skin scaly, scar invaginated & adhered at distal tibia,  cylinderical shape    Education Provided Proper wear schedule/adjustment;Correct ply sock adjustment;Proper Donning    Person(s) Educated Patient    Education Method Explanation;Verbal cues    Education Method Verbalized understanding;Tactile cues required;Verbal cues required;Needs further instruction    Donning Prosthesis Minimal assist                    PT Short Term Goals - 08/22/20 2217      PT SHORT TERM GOAL #1   Title Patient & wife verbalize proper cleaning, skin checking & donning of prosthesis.    Time 4    Period Weeks    Status New    Target Date 09/20/20      PT SHORT TERM GOAL #2   Title Patient tolerates prosthesis wear >/= 5 days / wk for >/= 6 hours total / day without skin issues.    Time 4    Period Weeks    Status New    Target Date 09/20/20      PT SHORT TERM GOAL #3   Title Patient & wife demonstrate understanding of initial HEP.    Time 4    Period Weeks    Status New    Target Date 09/20/20      PT SHORT TERM GOAL #4   Title w/c to level surface mat table scooting transfer using prosthesis with modA    Time 4    Period Weeks    Status New    Target Date 09/20/20             PT Long Term Goals - 08/22/20 2212      PT LONG TERM GOAL #1   Title Patient & wife demonstrate and verbalize understanding of proper prosthetic care to enable safe utililzation of prosthesis.    Time 6    Period Months    Status New    Target Date 02/13/21      PT LONG TERM GOAL #2   Title Patient tolerates prosthesis wear >90% of awake hours without skin or limb pain issues to enable function throughout his day.    Time 6    Period Months    Status New    Target Date 02/13/21      PT LONG TERM GOAL #3   Title Patient able to transfer w/c to chair or toilet with prosthesis modified independent.    Time 6    Period Months    Status New     Target Date 02/13/21      PT LONG TERM GOAL #4   Title  Sit to/from stand transfers and standing balance for ADLs with RW support wtih prosthesis with wife's minimal assist or supervision.    Time 6    Period Months    Status New    Target Date 02/13/21      PT LONG TERM GOAL #5   Title Patient ambulates 66' with RW & prosthesis with minimal assist.    Time 6    Period Months    Status New    Target Date 02/13/21                 Plan - 08/29/20 1433    Clinical Impression Statement Patient's distal tibia was too sore to work on standing at sink.  PT worked on balance activities with trunk control seated on 24" bar stool with feet supported on floor.    Personal Factors and Comorbidities Fitness;Time since onset of injury/illness/exacerbation;Comorbidity 2    Comorbidities ALS, HTN    Examination-Activity Limitations Lift;Locomotion Level;Stairs;Stand;Transfers    Examination-Participation Restrictions Community Activity    Stability/Clinical Decision Making Evolving/Moderate complexity    Rehab Potential Good    PT Frequency 2x / week    PT Duration Other (comment)   6 months   PT Treatment/Interventions ADLs/Self Care Home Management;DME Instruction;Gait training;Stair training;Functional mobility training;Therapeutic activities;Therapeutic exercise;Balance training;Neuromuscular re-education;Patient/family education;Prosthetic Training;Manual techniques;Scar mobilization;Passive range of motion    PT Next Visit Plan review prosthetic care, work on standing balance,    Consulted and Agree with Plan of Care Patient;Family member/caregiver    Family Member Consulted wife, Kairee Isa           Patient will benefit from skilled therapeutic intervention in order to improve the following deficits and impairments:  Abnormal gait,Cardiopulmonary status limiting activity,Decreased activity tolerance,Decreased balance,Decreased endurance,Decreased knowledge of use of  DME,Decreased mobility,Decreased range of motion,Decreased skin integrity,Decreased scar mobility,Decreased strength,Difficulty walking,Impaired flexibility,Impaired UE functional use,Postural dysfunction,Prosthetic Dependency  Visit Diagnosis: Muscle weakness (generalized)  Unsteadiness on feet  Other abnormalities of gait and mobility  Stiffness of right knee, not elsewhere classified  Stiffness of left knee, not elsewhere classified     Problem List Patient Active Problem List   Diagnosis Date Noted  . History of left below knee amputation (HCC) 06/04/2020  . 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 Moderate protein-calorie malnutrition (HCC) 04/13/2019  . Leg wound, left, subsequent encounter 10/20/2018  . S/P BKA (below knee amputation) unilateral, left (HCC) 08/26/2018  . Osteomyelitis (HCC) 08/01/2018  . Left foot and ankle infection 08/01/2018  . Cellulitis of left foot   . Chronic respiratory insufficiency 06/12/2014  . 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 Essential hypertension 01/25/2013    Vladimir Faster, PT, DPT 08/29/2020, 3:22 PM  [facility] Physical Therapy [address] [place], [place], [address] Phone: [phone]   Fax:  [phone]  Name: BHAVYA GRAND MRN: [id number] Date of Birth: 11-14-1955

## 2020-09-03 ENCOUNTER — Encounter: Payer: Medicare Other | Admitting: Physical Therapy

## 2020-09-04 ENCOUNTER — Encounter: Payer: Medicare Other | Admitting: Physical Therapy

## 2020-09-05 ENCOUNTER — Encounter: Payer: Medicare Other | Admitting: Physical Therapy

## 2020-09-10 ENCOUNTER — Encounter: Payer: Medicare Other | Admitting: Physical Therapy

## 2020-09-12 ENCOUNTER — Ambulatory Visit (INDEPENDENT_AMBULATORY_CARE_PROVIDER_SITE_OTHER): Payer: Medicare Other | Admitting: Physical Therapy

## 2020-09-12 ENCOUNTER — Encounter: Payer: Self-pay | Admitting: Physical Therapy

## 2020-09-12 ENCOUNTER — Other Ambulatory Visit: Payer: Self-pay

## 2020-09-12 DIAGNOSIS — M6281 Muscle weakness (generalized): Secondary | ICD-10-CM

## 2020-09-12 DIAGNOSIS — M25661 Stiffness of right knee, not elsewhere classified: Secondary | ICD-10-CM | POA: Diagnosis not present

## 2020-09-12 DIAGNOSIS — M25662 Stiffness of left knee, not elsewhere classified: Secondary | ICD-10-CM

## 2020-09-12 DIAGNOSIS — R2681 Unsteadiness on feet: Secondary | ICD-10-CM | POA: Diagnosis not present

## 2020-09-12 DIAGNOSIS — R2689 Other abnormalities of gait and mobility: Secondary | ICD-10-CM

## 2020-09-12 NOTE — Therapy (Signed)
[facility] Physical Therapy [address] [place], [place], [address] Phone: [phone]   Fax:  [phone]  Physical Therapy Treatment  Patient Details  Name: Bruce Deleon MRN: [id number] Date of Birth: [birth year] Referring Provider (PT): Doneen Poisson, MD   Encounter Date: 09/12/2020   PT End of Session - 09/12/20 1439    Visit Number 4    Number of Visits 50    Date for PT Re-Evaluation 11/15/20    Authorization Type [employer] Medicare    PT Start Time 1430    PT Stop Time 1520    PT Time Calculation (min) 50 min    Equipment Utilized During Treatment Gait belt    Activity Tolerance Patient tolerated treatment well;Patient limited by fatigue    Behavior During Therapy [facility] for tasks assessed/performed           Past Medical History:  Diagnosis Date  . 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 ALS (amyotrophic lateral sclerosis) (HCC)   . Chronic respiratory insufficiency 06/12/2014  . Essential hypertension 01/25/2013    Past Surgical History:  Procedure Laterality Date  . AMPUTATION Left 08/06/2018   Procedure: LEFT BELOW KNEE AMPUTATION;  Surgeon: Kathryne Hitch, MD;  Location: [facility];  Service: Orthopedics;  Laterality: Left;  . APPLICATION OF WOUND VAC Left 08/04/2018   Procedure: APPLICATION OF WOUND VAC;  Surgeon: Kathryne Hitch, MD;  Location: [facility];  Service: Orthopedics;  Laterality: Left;  . I & D EXTREMITY Left 08/01/2018   Procedure: IRRIGATION AND DEBRIDEMENT foot and ankle amputation first and second rays;  Surgeon: Kathryne Hitch, MD;  Location: [facility];  Service: Orthopedics;  Laterality: Left;  . I & D EXTREMITY Left 08/04/2018   Procedure: REPEAT IRRIGATION AND DEBRIDEMENT OF LEFT ANKLE;  Surgeon: Kathryne Hitch, MD;  Location: [facility];  Service: Orthopedics;  Laterality: Left;    There were no vitals filed for this visit.   Subjective Assessment - 09/12/20 1430    Subjective He  wore prosthesis 3-4hrs 1-2 X/day. He has been sick so only wore it once on those days.    Patient is accompained by: Family member   [name], wife   Pertinent History left TTA, ALS ~19yrs with rt side weakness > lt side, HTN,    Patient Stated Goals to use prosthesis to get out of w/c, drive,    Currently in Pain? No/denies                             [facility] Adult PT Treatment/Exercise - 09/12/20 1430      Transfers   Transfers Sit to Stand;Stand to Sit;Squat Pivot Transfers    Sit to Stand 4: Min assist;With upper extremity assist;With armrests;From chair/3-in-1;Other (comment)   to RW   Stand to Sit 4: Min guard;With upper extremity assist;With armrests;To chair/3-in-1;Other (comment)   from RW     Ambulation/Gait   Ambulation/Gait Yes    Ambulation/Gait Assistance 3: Mod assist    Ambulation Distance (Feet) 10 Feet   10' X 2   Assistive device Rolling walker;Prosthesis    Ambulation Surface Level;Indoor      Exercises   Exercises Knee/Hip      Knee/Hip Exercises: Stretches   Other Knee/Hip Stretches sitting with LLE on stool hafl height of chair with leg press to ext knee every minute for 5 min total.      Knee/Hip Exercises: Aerobic   Nustep  Seat 10 for first 2 min then Seat 11  level 5 for 8 min      Knee/Hip Exercises: Seated   Other Seated Knee/Hip Exercises LLE supported on chair without armrests with foot against wall - leg press into wall / quad set for 10 reps 3 sets 5 sec hold.      Prosthetics   Prosthetic Care Comments  PT instructed in adjusting ply socks with too few, too many& correct ply donning, standing & patella depth after weight bearing.  Increase wear to 5hrs 2x/day with off 2-3 hrs between wears. If sick but out of bed, wear prosthesis but limited activity based on sickness.    Current prosthetic wear tolerance (days/week)  daily since PT evaluation    Current prosthetic wear tolerance (#hours/day)  4-5 hrs but only once most days.  PT recommended 4-5hrs 2x/day.    Residual limb condition  No open areas, dry distal skin scaly, scar invaginated & adhered at distal tibia,  cylinderical shape    Education Provided Proper wear schedule/adjustment;Correct ply sock adjustment;Proper Donning;Other (comment)   see prosthetic care comments   Person(s) Educated Patient;Spouse    Education Method Explanation;Demonstration;Tactile cues;Verbal cues;Handout    Education Method Verbalized understanding;Returned demonstration;Tactile cues required;Verbal cues required;Needs further instruction    Donning Prosthesis Minimal assist                    PT Short Term Goals - 09/12/20 1531      PT SHORT TERM GOAL #1   Title Patient & wife verbalize proper cleaning, skin checking & donning of prosthesis.    Time 4    Period Weeks    Status On-going    Target Date 09/20/20      PT SHORT TERM GOAL #2   Title Patient tolerates prosthesis wear >/= 5 days / wk for >/= 6 hours total / day without skin issues.    Time 4    Period Weeks    Status On-going    Target Date 09/20/20      PT SHORT TERM GOAL #3   Title Patient & wife demonstrate understanding of initial HEP.    Time 4    Period Weeks    Status On-going    Target Date 09/20/20      PT SHORT TERM GOAL #4   Title w/c to level surface mat table scooting transfer using prosthesis with modA    Time 4    Period Weeks    Status On-going    Target Date 09/20/20             PT Long Term Goals - 09/12/20 1531      PT LONG TERM GOAL #1   Title Patient & wife demonstrate and verbalize understanding of proper prosthetic care to enable safe utililzation of prosthesis.    Time 6    Period Months    Status On-going    Target Date 02/13/21      PT LONG TERM GOAL #2   Title Patient tolerates prosthesis wear >90% of awake hours without skin or limb pain issues to enable function throughout his day.    Time 6    Period Months    Status On-going    Target Date  02/13/21      PT LONG TERM GOAL #3   Title Patient able to transfer w/c to chair or toilet with prosthesis modified independent.    Time 6    Period Months  Status On-going    Target Date 02/13/21      PT LONG TERM GOAL #4   Title Sit to/from stand transfers and standing balance for ADLs with RW support wtih prosthesis with wife's minimal assist or supervision.    Time 6    Period Months    Status On-going    Target Date 02/13/21      PT LONG TERM GOAL #5   Title Patient ambulates 75' with RW & prosthesis with minimal assist.    Time 6    Period Months    Status On-going    Target Date 02/13/21                 Plan - 09/12/20 1439    Clinical Impression Statement PT educated patient on determining proper ply fit and he appears to have general understanding.  PT instructed in positioning to facilitate knee extension & modified leg press against wall which verbalizes understanding.    Personal Factors and Comorbidities Fitness;Time since onset of injury/illness/exacerbation;Comorbidity 2    Comorbidities ALS, HTN    Examination-Activity Limitations Lift;Locomotion Level;Stairs;Stand;Transfers    Examination-Participation Restrictions Community Activity    Stability/Clinical Decision Making Evolving/Moderate complexity    Rehab Potential Good    PT Frequency 2x / week    PT Duration Other (comment)   6 months   PT Treatment/Interventions ADLs/Self Care Home Management;DME Instruction;Gait training;Stair training;Functional mobility training;Therapeutic activities;Therapeutic exercise;Balance training;Neuromuscular re-education;Patient/family education;Prosthetic Training;Manual techniques;Scar mobilization;Passive range of motion    PT Next Visit Plan check STGs. review prosthetic care, work on standing balance,    Consulted and Agree with Plan of Care Patient;Family member/caregiver    Family Member Consulted wife, Trystian Crisanto           Patient will benefit from  skilled therapeutic intervention in order to improve the following deficits and impairments:  Abnormal gait,Cardiopulmonary status limiting activity,Decreased activity tolerance,Decreased balance,Decreased endurance,Decreased knowledge of use of DME,Decreased mobility,Decreased range of motion,Decreased skin integrity,Decreased scar mobility,Decreased strength,Difficulty walking,Impaired flexibility,Impaired UE functional use,Postural dysfunction,Prosthetic Dependency  Visit Diagnosis: Muscle weakness (generalized)  Unsteadiness on feet  Other abnormalities of gait and mobility  Stiffness of right knee, not elsewhere classified  Stiffness of left knee, not elsewhere classified     Problem List Patient Active Problem List   Diagnosis Date Noted  . History of left below knee amputation (HCC) 06/04/2020  . 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 Moderate protein-calorie malnutrition (HCC) 04/13/2019  . Leg wound, left, subsequent encounter 10/20/2018  . S/P BKA (below knee amputation) unilateral, left (HCC) 08/26/2018  . Osteomyelitis (HCC) 08/01/2018  . Left foot and ankle infection 08/01/2018  . Cellulitis of left foot   . Chronic respiratory insufficiency 06/12/2014  . 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 Essential hypertension 01/25/2013    Vladimir Faster. PT, DPT 09/12/2020, 3:43 PM  [facility] Physical Therapy [address] [place], [place], [address] Phone: [phone]   Fax:  [phone]  Name: VALDEZ BRANNAN MRN: [id number] Date of Birth: 1956-05-22

## 2020-09-17 ENCOUNTER — Encounter: Payer: Medicare Other | Admitting: Physical Therapy

## 2020-09-19 ENCOUNTER — Ambulatory Visit (INDEPENDENT_AMBULATORY_CARE_PROVIDER_SITE_OTHER): Payer: Medicare Other | Admitting: Physical Therapy

## 2020-09-19 ENCOUNTER — Encounter: Payer: Self-pay | Admitting: Physical Therapy

## 2020-09-19 ENCOUNTER — Other Ambulatory Visit: Payer: Self-pay

## 2020-09-19 DIAGNOSIS — M25661 Stiffness of right knee, not elsewhere classified: Secondary | ICD-10-CM | POA: Diagnosis not present

## 2020-09-19 DIAGNOSIS — R2681 Unsteadiness on feet: Secondary | ICD-10-CM | POA: Diagnosis not present

## 2020-09-19 DIAGNOSIS — M25662 Stiffness of left knee, not elsewhere classified: Secondary | ICD-10-CM

## 2020-09-19 DIAGNOSIS — R2689 Other abnormalities of gait and mobility: Secondary | ICD-10-CM | POA: Diagnosis not present

## 2020-09-19 DIAGNOSIS — M6281 Muscle weakness (generalized): Secondary | ICD-10-CM

## 2020-09-19 NOTE — Therapy (Signed)
[facility] Physical Therapy [address] [place], [place], [address] Phone: [phone]   Fax:  [phone]  Physical Therapy Treatment  Patient Details  Name: Bruce Deleon MRN: [id number] Date of Birth: [birth year] Referring Provider (PT): Jean Rosenthal, MD   Encounter Date: 09/19/2020   PT End of Session - 09/19/20 1433    Visit Number 5    Number of Visits 50    Date for PT Re-Evaluation 11/15/20    Authorization Type UHC Medicare    PT Start Time 1429    PT Stop Time 1500    PT Time Calculation (min) 31 min    Equipment Utilized During Treatment Gait belt    Activity Tolerance Patient tolerated treatment well;Patient limited by fatigue    Behavior During Therapy [facility] for tasks assessed/performed           Past Medical History:  Diagnosis Date  . ALS (amyotrophic lateral sclerosis) ([place]) 01/25/2013  . ALS (amyotrophic lateral sclerosis) ([place])   . Chronic respiratory insufficiency 06/12/2014  . Essential hypertension 01/25/2013    Past Surgical History:  Procedure Laterality Date  . AMPUTATION Left 08/06/2018   Procedure: LEFT BELOW KNEE AMPUTATION;  Surgeon: Mcarthur Rossetti, MD;  Location: [facility];  Service: Orthopedics;  Laterality: Left;  . APPLICATION OF WOUND VAC Left 08/04/2018   Procedure: APPLICATION OF WOUND VAC;  Surgeon: Mcarthur Rossetti, MD;  Location: [facility];  Service: Orthopedics;  Laterality: Left;  . I & D EXTREMITY Left 08/01/2018   Procedure: IRRIGATION AND DEBRIDEMENT foot and ankle amputation first and second rays;  Surgeon: Mcarthur Rossetti, MD;  [facility]: [facility];  Service: Orthopedics;  Laterality: Left;  . I & D EXTREMITY Left 08/04/2018   Procedure: REPEAT IRRIGATION AND DEBRIDEMENT OF LEFT ANKLE;  Surgeon: Mcarthur Rossetti, MD;  Location: [facility];  Service: Orthopedics;  Laterality: Left;    There were no vitals filed for this visit.   Subjective Assessment - 09/19/20 1430    Subjective He  is wearing prosthesis 4-5 hrs 2x/day with bone hruting 6/10 when putting weight on it.    Patient is accompained by: Family member   [employer], wife   Pertinent History left TTA, ALS ~51yr with rt side weakness > lt side, HTN,    Patient Stated Goals to use prosthesis to get out of w/c, drive,    Currently in Pain? Yes    Pain Score 6     Pain Location Leg   residual limb   Pain Orientation Left;Distal    Pain Descriptors / Indicators Sharp    Pain Type Acute pain    Pain Onset 1 to 4 weeks ago    Pain Frequency Intermittent    Aggravating Factors  wearing prosthesis,    Pain Relieving Factors during day sits down, at night nothing                             [facility] - 09/19/20 1429      Transfers   Transfers Sit to Stand;Stand to Sit;Squat Pivot Transfers    Sit to Stand 4: Min guard;With upper extremity assist;With armrests;From chair/3-in-1;Other (comment)   to //bars   Stand to Sit 4: Min guard;With upper extremity assist;With armrests;To chair/3-in-1;Other (comment)   from //bars     Therapeutic Activites    Therapeutic Activities Other Therapeutic Activities    Other Therapeutic Activities sitting on 24" bar stool  without UE support: forward lean with recovery & back lean with recovery 10 reps ea.      Exercises   Exercises Knee/Hip      Knee/Hip Exercises: Aerobic   Nustep Seat 11 for first 2 min then Seat 12  level 5 for 8 min      Knee/Hip Exercises: Seated   Other Seated Knee/Hip Exercises LLE supported on chair without armrests with foot against wall - leg press into wall / quad set for 10 reps 3 sets 5 sec hold.      Manual Therapy   Manual Therapy Other (comment)    Other Manual Therapy mob belt in standing to facilitate knee extension in bilateral stance & stance on LLE stepping RLE forward & back.      Prosthetics   Prosthetic Care Comments  Pt seeing prosthetist on Monday, 3/14. PT recommended discussing tibial  pain and alignment changes as indicated.  Continue current wear as increasing may increase tibial pain & limit progress.    Current prosthetic wear tolerance (days/week)  daily since PT evaluation    Current prosthetic wear tolerance (#hours/day)  4-5hrs 2x/day.    Residual limb condition  No open areas, prominent tibia with minimal soft tissue, dry distal skin scaly, scar invaginated & adhered at distal tibia,  cylinderical shape    Education Provided Proper wear schedule/adjustment;Correct ply sock adjustment;Proper Donning;Other (comment)   see prosthetic care comments   Person(s) Educated Patient;Spouse    Education Method Explanation;Verbal cues;Tactile cues;Demonstration    Education Method Verbalized understanding;Tactile cues required;Verbal cues required;Needs further instruction    Donning Prosthesis Minimal assist          Patient reported feeling faint during PT session in including nausea & sweating.  Once outdoors where cooler & mask off, he reported felt better. His color, respirations & general outlook improved to level appeared okay to return home vs seeking medical attention.           PT Short Term Goals - 09/19/20 1519      PT SHORT TERM GOAL #1   Title Patient & wife verbalize proper cleaning, skin checking & donning of prosthesis.    Baseline MET 09/19/2020    Time 4    Period Weeks    Status Achieved    Target Date 09/20/20      PT SHORT TERM GOAL #2   Title Patient tolerates prosthesis wear >/= 5 days / wk for >/= 6 hours total / day without skin issues.    Baseline MET 09/19/2020    Time 4    Period Weeks    Status Achieved    Target Date 09/20/20      PT SHORT TERM GOAL #3   Title Patient & wife demonstrate understanding of initial HEP.    Baseline MET 09/19/2020    Time 4    Period Weeks    Status Achieved    Target Date 09/20/20      PT SHORT TERM GOAL #4   Title w/c to level surface mat table scooting transfer using prosthesis with modA     Baseline MET 09/19/2020    Time 4    Period Weeks    Status On-going    Target Date 09/20/20             PT Long Term Goals - 09/12/20 1531      PT LONG TERM GOAL #1   Title Patient & wife demonstrate and verbalize understanding of proper prosthetic  care to enable safe utililzation of prosthesis.    Time 6    Period Months    Status On-going    Target Date 02/13/21      PT LONG TERM GOAL #2   Title Patient tolerates prosthesis wear >90% of awake hours without skin or limb pain issues to enable function throughout his day.    Time 6    Period Months    Status On-going    Target Date 02/13/21      PT LONG TERM GOAL #3   Title Patient able to transfer w/c to chair or toilet with prosthesis modified independent.    Time 6    Period Months    Status On-going    Target Date 02/13/21      PT LONG TERM GOAL #4   Title Sit to/from stand transfers and standing balance for ADLs with RW support wtih prosthesis with wife's minimal assist or supervision.    Time 6    Period Months    Status On-going    Target Date 02/13/21      PT LONG TERM GOAL #5   Title Patient ambulates 42' with RW & prosthesis with minimal assist.    Time 6    Period Months    Status On-going    Target Date 02/13/21                 Plan - 09/19/20 1433    Clinical Impression Statement Patient felt faint during PT session but recovered once outdoors with mask off & cool air.  Pt met all STGs for first 30 days.  Pain in distal tibia is limiting prosthetic wear & weight bearing. He sees prosthetist on Monday.    Personal Factors and Comorbidities Fitness;Time since onset of injury/illness/exacerbation;Comorbidity 2    Comorbidities ALS, HTN    Examination-Activity Limitations Lift;Locomotion Level;Stairs;Stand;Transfers    Examination-Participation Restrictions Community Activity    Stability/Clinical Decision Making Evolving/Moderate complexity    Rehab Potential Good    PT Frequency 2x / week     PT Duration Other (comment)   6 months   PT Treatment/Interventions ADLs/Self Care Home Management;DME Instruction;Gait training;Stair training;Functional mobility training;Therapeutic activities;Therapeutic exercise;Balance training;Neuromuscular re-education;Patient/family education;Prosthetic Training;Manual techniques;Scar mobilization;Passive range of motion    PT Next Visit Plan set new STGs. review prosthetic care, work on standing balance,    Consulted and Agree with Plan of Care Patient;Family member/caregiver    Family Member Consulted wife, Ladarrion Telfair           Patient will benefit from skilled therapeutic intervention in order to improve the following deficits and impairments:  Abnormal gait,Cardiopulmonary status limiting activity,Decreased activity tolerance,Decreased balance,Decreased endurance,Decreased knowledge of use of DME,Decreased mobility,Decreased range of motion,Decreased skin integrity,Decreased scar mobility,Decreased strength,Difficulty walking,Impaired flexibility,Impaired UE functional use,Postural dysfunction,Prosthetic Dependency  Visit Diagnosis: Muscle weakness (generalized)  Unsteadiness on feet  Other abnormalities of gait and mobility  Stiffness of right knee, not elsewhere classified  Stiffness of left knee, not elsewhere classified     Problem List Patient Active Problem List   Diagnosis Date Noted  . History of left below knee amputation ([place]) 06/04/2020  . 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 Moderate protein-calorie malnutrition ([place]) 04/13/2019  . Leg wound, left, subsequent encounter 10/20/2018  . S/P BKA (below knee amputation) unilateral, left ([place]) 08/26/2018  . Osteomyelitis ([place]) 08/01/2018  . Left foot and ankle infection 08/01/2018  . Cellulitis of left foot   . Chronic respiratory  insufficiency 06/12/2014  . ALS (amyotrophic lateral sclerosis) ([place]) 01/25/2013  . Essential hypertension  01/25/2013    Jamey Reas, PT, DPT 09/19/2020, 3:23 PM  [facility] Physical Therapy [address] [place], [place], [address] Phone: [phone]   Fax:  [phone]  Name: Bruce Deleon MRN: [id number] Date of Birth: 1955/10/04

## 2020-09-24 ENCOUNTER — Encounter: Payer: Medicare Other | Admitting: Physical Therapy

## 2020-09-25 ENCOUNTER — Encounter: Payer: Self-pay | Admitting: Physical Therapy

## 2020-09-25 ENCOUNTER — Telehealth: Payer: Self-pay | Admitting: Physical Therapy

## 2020-09-25 ENCOUNTER — Ambulatory Visit (INDEPENDENT_AMBULATORY_CARE_PROVIDER_SITE_OTHER): Payer: Medicare Other | Admitting: Physical Therapy

## 2020-09-25 ENCOUNTER — Other Ambulatory Visit: Payer: Self-pay

## 2020-09-25 DIAGNOSIS — R2689 Other abnormalities of gait and mobility: Secondary | ICD-10-CM

## 2020-09-25 DIAGNOSIS — M6281 Muscle weakness (generalized): Secondary | ICD-10-CM | POA: Diagnosis not present

## 2020-09-25 DIAGNOSIS — M25661 Stiffness of right knee, not elsewhere classified: Secondary | ICD-10-CM

## 2020-09-25 DIAGNOSIS — R2681 Unsteadiness on feet: Secondary | ICD-10-CM | POA: Diagnosis not present

## 2020-09-25 DIAGNOSIS — M25662 Stiffness of left knee, not elsewhere classified: Secondary | ICD-10-CM

## 2020-09-25 NOTE — Telephone Encounter (Signed)
Yes

## 2020-09-25 NOTE — Therapy (Signed)
[facility] Physical Therapy [address] [place], [place], [address] Phone: [phone]   Fax:  [phone]  Physical Therapy Treatment  Patient Details  Name: Bruce Deleon MRN: [id number] Date of Birth: 1955-12-14 Referring Provider (PT): Jean Rosenthal, MD   Encounter Date: 09/25/2020   PT End of Session - 09/25/20 1510    Visit Number 6    Number of Visits 50    Date for PT Re-Evaluation 11/15/20    Authorization Type UHC Medicare    PT Start Time 1510    PT Stop Time 1555    PT Time Calculation (min) 45 min    Equipment Utilized During Treatment Gait belt    Activity Tolerance Patient tolerated treatment well;Patient limited by fatigue    Behavior During Therapy [facility] for tasks assessed/performed           Past Medical History:  Diagnosis Date  . ALS (amyotrophic lateral sclerosis) ([place]) 01/25/2013  . ALS (amyotrophic lateral sclerosis) ([place])   . Chronic respiratory insufficiency 06/12/2014  . Essential hypertension 01/25/2013    Past Surgical History:  Procedure Laterality Date  . AMPUTATION Left 08/06/2018   Procedure: LEFT BELOW KNEE AMPUTATION;  Surgeon: Mcarthur Rossetti, MD;  [facility];  Service: Orthopedics;  Laterality: Left;  . APPLICATION OF WOUND VAC Left 08/04/2018   Procedure: APPLICATION OF WOUND VAC;  Surgeon: Mcarthur Rossetti, MD;  [facility];  Service: Orthopedics;  Laterality: Left;  . I & D EXTREMITY Left 08/01/2018   Procedure: IRRIGATION AND DEBRIDEMENT foot and ankle amputation first and second rays;  Surgeon: Mcarthur Rossetti, MD;  [facility];  Service: Orthopedics;  Laterality: Left;  . I & D EXTREMITY Left 08/04/2018   Procedure: REPEAT IRRIGATION AND DEBRIDEMENT OF LEFT ANKLE;  Surgeon: Mcarthur Rossetti, MD;  [facility];  Service: Orthopedics;  Laterality: Left;    There were no vitals filed for this visit.   Subjective Assessment - 09/25/20 1510    Subjective He  donnes prosthesis ~1 hour after arising, removes for ~1 hr mid day and then doffes 7-8pm  going to bed at 9pm.    Patient is accompained by: Family member   Bruce Deleon, wife   Pertinent History left TTA, ALS ~2yr with rt side weakness > lt side, HTN,    Patient Stated Goals to use prosthesis to get out of w/c, drive,    Currently in Pain? Yes    Pain Score 4     Pain Location Knee    Pain Orientation Right;Distal;Medial    Pain Descriptors / Indicators Aching;Sharp    Pain Onset 1 to 4 weeks ago    Pain Frequency Intermittent    Aggravating Factors  lifting prosthesis off ground and standing.    Pain Relieving Factors prosthesis supported on ground or taking it off                             [facility] - [facility] 1510      Transfers   Transfers Sit to Stand;Stand to Sit;Squat Pivot Transfers    Sit to Stand 4: Min guard;With upper extremity assist;With armrests;From chair/3-in-1;Other (comment)   toRW   Stand to Sit 4: Min guard;With upper extremity assist;With armrests;To chair/3-in-1;Other (comment)   from RW     Ambulation/Gait   Ambulation/Gait Yes    Ambulation/Gait Assistance 4: Min assist    Ambulation/Gait Assistance Details PT  demo, visual (markers on RW) verbal & tactile cues on proper step width, wt shift over prosthesis in stance & RW distance moved. PT demo & verbal cues to wife on how to safely assist. She verbalized & return demo understanding.    Ambulation Distance (Feet) 70 Feet   70' X 1, 20' X 2   Assistive device Rolling walker;Prosthesis    Ambulation Surface Level;Indoor      Self-Care   Self-Care Other Self-Care Comments    Other Self-Care Comments  PT educated pt & wife on [employer] signal 3 program      Therapeutic Activites    Therapeutic Activities --    Other Therapeutic Activities --      Exercises   Exercises Knee/Hip      Knee/Hip Exercises: Aerobic   Nustep --      Knee/Hip  Exercises: Seated   Other Seated Knee/Hip Exercises --      Manual Therapy   Manual Therapy Other (comment)    Other Manual Therapy --      Prosthetics   Prosthetic Care Comments  scar mobilizations - PT demo & verbal cues on technique. Pt & wife verbalized understanding.    Current prosthetic wear tolerance (days/week)  daily    Current prosthetic wear tolerance (#hours/day)  most of awake hours except 1hr 3x    Residual limb condition  No open areas, prominent tibia with minimal soft tissue, dry distal skin scaly, scar invaginated & adhered at distal tibia,  cylinderical shape    Education Provided Proper wear schedule/adjustment;Proper Donning;Other (comment);Residual limb care   see prosthetic care comments   Person(s) Educated Patient;Spouse    Education Method Explanation;Demonstration;Tactile cues;Verbal cues    Education Method Verbalized understanding;Returned demonstration;Tactile cues required;Verbal cues required;Needs further instruction    Donning Prosthesis Minimal assist                    PT Short Term Goals - 09/25/20 1630      PT SHORT TERM GOAL #1   Title Patient & wife verbalize proper cleaning, skin checking & donning of prosthesis.    Baseline MET 09/19/2020    Time 4    Period Weeks    Status Achieved    Target Date 09/20/20      PT SHORT TERM GOAL #2   Title Patient tolerates prosthesis wear >/= 5 days / wk for >/= 6 hours total / day without skin issues.    Baseline MET 09/19/2020    Time 4    Period Weeks    Status Achieved    Target Date 09/20/20      PT SHORT TERM GOAL #3   Title Patient & wife demonstrate understanding of initial HEP.    Baseline MET 09/19/2020    Time 4    Period Weeks    Status Achieved    Target Date 09/20/20      PT SHORT TERM GOAL #4   Title w/c to level surface mat table scooting transfer using prosthesis with modA    Baseline MET 09/19/2020    Time 4    Period Weeks    Status Achieved    Target Date 09/20/20              PT Short Term Goals - 09/25/20 1633      PT SHORT TERM GOAL #1   Title Patient & wife verbalize how to properly adjust ply socks.    Time 4  Period Weeks    Status New    Target Date 10/18/20      PT SHORT TERM GOAL #2   Title Patient tolerates prosthesis wear daily for >90% of awake hours without skin issues.    Time 4    Period Weeks    Status Revised    Target Date 10/18/20      PT SHORT TERM GOAL #3   Title Patient ambulates 70' with RW & prosthesis with minA    Time 4    Period Weeks    Status New    Target Date 10/18/20             PT Long Term Goals - 09/25/20 1630      PT LONG TERM GOAL #1   Title Patient & wife demonstrate and verbalize understanding of proper prosthetic care to enable safe utililzation of prosthesis.    Time 6    Period Months    Status On-going    Target Date 02/13/21      PT LONG TERM GOAL #2   Title Patient tolerates prosthesis wear >90% of awake hours without skin or limb pain issues to enable function throughout his day.    Time 6    Period Months    Status On-going    Target Date 02/13/21      PT LONG TERM GOAL #3   Title Patient able to transfer w/c to chair or toilet with prosthesis modified independent.    Time 6    Period Months    Status On-going    Target Date 02/13/21      PT LONG TERM GOAL #4   Title Sit to/from stand transfers and standing balance for ADLs with RW support wtih prosthesis modified independent.    Time 6    Period Months    Status Revised    Target Date 02/13/21      PT LONG TERM GOAL #5   Title Patient ambulates 150' with RW & prosthesis with supervision    Time 6    Period Months    Status Revised    Target Date 02/13/21                 Plan - 09/25/20 1511    Clinical Impression Statement PT session worked on prosthetic gait with RW with proper step width & wt shift over prosthesis in stance.  PT also instructed wife in how to safely assist gait for limited  distances between 2 chairs with armrests which she appears to understand.    Personal Factors and Comorbidities Fitness;Time since onset of injury/illness/exacerbation;Comorbidity 2    Comorbidities ALS, HTN    Examination-Activity Limitations Lift;Locomotion Level;Stairs;Stand;Transfers    Examination-Participation Restrictions Community Activity    Stability/Clinical Decision Making Evolving/Moderate complexity    Rehab Potential Good    PT Frequency 2x / week    PT Duration Other (comment)   6 months   PT Treatment/Interventions ADLs/Self Care Home Management;DME Instruction;Gait training;Stair training;Functional mobility training;Therapeutic activities;Therapeutic exercise;Balance training;Neuromuscular re-education;Patient/family education;Prosthetic Training;Manual techniques;Scar mobilization;Passive range of motion    PT Next Visit Plan review prosthetic care, work on standing balance,    Consulted and Agree with Plan of Care Patient;Family member/caregiver    Family Member Consulted wife, Bruce Deleon           Patient will benefit from skilled therapeutic intervention in order to improve the following deficits and impairments:  Abnormal gait,Cardiopulmonary status limiting activity,Decreased activity tolerance,Decreased balance,Decreased endurance,Decreased knowledge of  use of DME,Decreased mobility,Decreased range of motion,Decreased skin integrity,Decreased scar mobility,Decreased strength,Difficulty walking,Impaired flexibility,Impaired UE functional use,Postural dysfunction,Prosthetic Dependency  Visit Diagnosis: Muscle weakness (generalized)  Unsteadiness on feet  Other abnormalities of gait and mobility  Stiffness of right knee, not elsewhere classified  Stiffness of left knee, not elsewhere classified     Problem List Patient Active Problem List   Diagnosis Date Noted  . History of left below knee amputation ([place]) 06/04/2020  . Phantom pain after amputation of  lower extremity ([place]) 04/13/2019  . Moderate protein-calorie malnutrition ([place]) 04/13/2019  . Leg wound, left, subsequent encounter 10/20/2018  . S/P BKA (below knee amputation) unilateral, left ([place]) 08/26/2018  . Osteomyelitis ([place]) 08/01/2018  . Left foot and ankle infection 08/01/2018  . Cellulitis of left foot   . Chronic respiratory insufficiency 06/12/2014  . ALS (amyotrophic lateral sclerosis) ([place]) 01/25/2013  . Essential hypertension 01/25/2013    Jamey Reas, PT, DPT 09/25/2020, 4:33 PM  [facility] Physical Therapy [address] [place], [place], [address] Phone: [phone]   Fax:  [phone]  Name: Bruce Deleon MRN: [id number] Date of Birth: May 28, 1956

## 2020-09-25 NOTE — Telephone Encounter (Signed)
Bruce Deleon is able to walk safely with his wife's assistance using a standard rolling walker & his prosthesis. He only has a rollator walker at home which is too unstable for him. Can you please send orders for rolling walker with fixed 5" wheels to [employer] or another local DME that will bill his insurance? Thank you Vladimir Faster, PT, DPT Physical Therapist Specializing in Prosthetic Rehab [facility] [address] [place], [place] [address] Phone [phone] FAX [phone]

## 2020-09-25 NOTE — Telephone Encounter (Signed)
Ok to order 

## 2020-09-26 ENCOUNTER — Ambulatory Visit (INDEPENDENT_AMBULATORY_CARE_PROVIDER_SITE_OTHER): Payer: Medicare Other | Admitting: Physical Therapy

## 2020-09-26 ENCOUNTER — Encounter: Payer: Self-pay | Admitting: Physical Therapy

## 2020-09-26 DIAGNOSIS — M25661 Stiffness of right knee, not elsewhere classified: Secondary | ICD-10-CM

## 2020-09-26 DIAGNOSIS — M6281 Muscle weakness (generalized): Secondary | ICD-10-CM

## 2020-09-26 DIAGNOSIS — M25662 Stiffness of left knee, not elsewhere classified: Secondary | ICD-10-CM

## 2020-09-26 DIAGNOSIS — R2689 Other abnormalities of gait and mobility: Secondary | ICD-10-CM | POA: Diagnosis not present

## 2020-09-26 DIAGNOSIS — R2681 Unsteadiness on feet: Secondary | ICD-10-CM | POA: Diagnosis not present

## 2020-09-26 NOTE — Telephone Encounter (Signed)
Can you help me with this please  

## 2020-09-26 NOTE — Therapy (Signed)
[facility] [address] [place], [place], [address] Phone: [phone]   Fax:  [phone]  Physical Therapy Treatment  Patient Details  Name: Bruce Deleon MRN: [id number] Date of Birth: [birth year] Referring Provider (PT): Doneen Poisson, MD   Encounter Date: 09/26/2020   PT End of Session - 09/26/20 1422    Visit Number 7    Number of Visits 50    Date for PT Re-Evaluation 11/15/20    Authorization Type U[employer] Medicare    PT Start Time 1425    PT Stop Time 1510    PT Time Calculation (min) 45 min    Equipment Utilized During Treatment Gait belt    Activity Tolerance Patient tolerated treatment well;Patient limited by fatigue    Behavior During Therapy [facility] for tasks assessed/performed           Past Medical History:  Diagnosis Date  . 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 ALS (amyotrophic lateral sclerosis) (HCC)   . Chronic respiratory insufficiency 06/12/2014  . Essential hypertension 01/25/2013    Past Surgical History:  Procedure Laterality Date  . AMPUTATION Left 08/06/2018   Procedure: LEFT BELOW KNEE AMPUTATION;  Surgeon: Kathryne Hitch, MD;  Location: [facility];  Service: Orthopedics;  Laterality: Left;  . APPLICATION OF WOUND VAC Left 08/04/2018   Procedure: APPLICATION OF WOUND VAC;  Surgeon: Kathryne Hitch, MD;  Location: [facility];  Service: Orthopedics;  Laterality: Left;  . I & D EXTREMITY Left 08/01/2018   Procedure: IRRIGATION AND DEBRIDEMENT foot and ankle amputation first and second rays;  Surgeon: Kathryne Hitch, MD;  Location: [facility];  Service: Orthopedics;  Laterality: Left;  . I & D EXTREMITY Left 08/04/2018   Procedure: REPEAT IRRIGATION AND DEBRIDEMENT OF LEFT ANKLE;  Surgeon: Kathryne Hitch, MD;  Location: [facility];  Service: Orthopedics;  Laterality: Left;    There were no vitals filed for this visit.   Subjective Assessment - 09/26/20 1424    Subjective He  saw Judy Pimple, [facility] prior to PT today and he made changes.  Pt reports that it feels better.    Patient is accompained by: Family member   [name], wife   Pertinent History left TTA, ALS ~38yrs with rt side weakness > lt side, HTN,    Patient Stated Goals to use prosthesis to get out of w/c, drive,    Currently in Pain? Yes    Pain Score 4     Pain Location Leg    Pain Orientation Left;Distal    Pain Descriptors / Indicators Sore    Pain Type Other (Comment)   prosthetic   Pain Onset 1 to 4 weeks ago    Pain Frequency Intermittent    Aggravating Factors  standing & weight bearing on prosthesis    Pain Relieving Factors sitting or taking prosthesis off                             [facility] Adult PT Treatment/Exercise - 09/26/20 1430      Transfers   Transfers Sit to Stand;Stand to Sit;Squat Pivot Transfers    Sit to Stand 4: Min guard;With upper extremity assist;With armrests;From chair/3-in-1;Other (comment)   toRW   Stand to Sit 4: Min guard;With upper extremity assist;With armrests;To chair/3-in-1;Other (comment)   from RW     Ambulation/Gait   Ambulation/Gait Yes    Ambulation/Gait Assistance 4: Min assist    Ambulation/Gait  Assistance Details PT demo, visual (markers on RW) verbal & tactile cues on proper step width, wt shift over prosthesis in stance & RW distance moved.    Ambulation Distance (Feet) 70 Feet   70' X 1, 20' X 1   Assistive device Rolling walker;Prosthesis    Ambulation Surface Level;Indoor      Self-Care   Self-Care Other Self-Care Comments    Other Self-Care Comments  PT educated pt & wife on [employer] Dept signal 3 program      Exercises   Exercises Knee/Hip      Knee/Hip Exercises: Stretches   Passive Hamstring Stretch Left;1 rep;60 seconds    Passive Hamstring Stretch Limitations supine with LE extended on leg press with PT light end range stretch      Knee/Hip Exercises: Aerobic   Nustep Seat 11 for first 2 min then Seat  12  level 5 for 8 min      Knee/Hip Exercises: Machines for Strengthening   Cybex Leg Press shuttle leg press BLEs 50# 10 reps 2 sets 1st set back 45* & 2nd set back flat      Prosthetics   Current prosthetic wear tolerance (days/week)  daily    Current prosthetic wear tolerance (#hours/day)  most of awake hours except 1hr 3x    Residual limb condition  No open areas, prominent tibia with minimal soft tissue, dry distal skin scaly, scar invaginated & adhered at distal tibia,  cylinderical shape    Education Provided Proper wear schedule/adjustment;Other (comment);Residual limb care   see prosthetic care comments                   PT Short Term Goals - 09/25/20 1633      PT SHORT TERM GOAL #1   Title Patient & wife verbalize how to properly adjust ply socks.    Time 4    Period Weeks    Status New    Target Date 10/18/20      PT SHORT TERM GOAL #2   Title Patient tolerates prosthesis wear daily for >90% of awake hours without skin issues.    Time 4    Period Weeks    Status Revised    Target Date 10/18/20      PT SHORT TERM GOAL #3   Title Patient ambulates 15' with RW & prosthesis with minA    Time 4    Period Weeks    Status New    Target Date 10/18/20             PT Long Term Goals - 09/25/20 1630      PT LONG TERM GOAL #1   Title Patient & wife demonstrate and verbalize understanding of proper prosthetic care to enable safe utililzation of prosthesis.    Time 6    Period Months    Status On-going    Target Date 02/13/21      PT LONG TERM GOAL #2   Title Patient tolerates prosthesis wear >90% of awake hours without skin or limb pain issues to enable function throughout his day.    Time 6    Period Months    Status On-going    Target Date 02/13/21      PT LONG TERM GOAL #3   Title Patient able to transfer w/c to chair or toilet with prosthesis modified independent.    Time 6    Period Months    Status On-going    Target Date 02/13/21  PT  LONG TERM GOAL #4   Title Sit to/from stand transfers and standing balance for ADLs with RW support wtih prosthesis modified independent.    Time 6    Period Months    Status Revised    Target Date 02/13/21      PT LONG TERM GOAL #5   Title Patient ambulates 150' with RW & prosthesis with supervision    Time 6    Period Months    Status Revised    Target Date 02/13/21                 Plan - 09/26/20 1424    Clinical Impression Statement Prosthetist made changes to alignment & ground / flared areas of discomfort in socket and it appears to have helped. PT session also added BLE leg press to program in clinic.    Personal Factors and Comorbidities Fitness;Time since onset of injury/illness/exacerbation;Comorbidity 2    Comorbidities ALS, HTN    Examination-Activity Limitations Lift;Locomotion Level;Stairs;Stand;Transfers    Examination-Participation Restrictions Community Activity    Stability/Clinical Decision Making Evolving/Moderate complexity    Rehab Potential Good    PT Frequency 2x / week    PT Duration Other (comment)   6 months   PT Treatment/Interventions ADLs/Self Care Home Management;DME Instruction;Gait training;Stair training;Functional mobility training;Therapeutic activities;Therapeutic exercise;Balance training;Neuromuscular re-education;Patient/family education;Prosthetic Training;Manual techniques;Scar mobilization;Passive range of motion    PT Next Visit Plan review prosthetic care, work on standing balance,    Consulted and Agree with Plan of Care Patient;Family member/caregiver    Family Member Consulted wife, Gorman Safi           Patient will benefit from skilled therapeutic intervention in order to improve the following deficits and impairments:  Abnormal gait,Cardiopulmonary status limiting activity,Decreased activity tolerance,Decreased balance,Decreased endurance,Decreased knowledge of use of DME,Decreased mobility,Decreased range of  motion,Decreased skin integrity,Decreased scar mobility,Decreased strength,Difficulty walking,Impaired flexibility,Impaired UE functional use,Postural dysfunction,Prosthetic Dependency  Visit Diagnosis: Muscle weakness (generalized)  Unsteadiness on feet  Other abnormalities of gait and mobility  Stiffness of right knee, not elsewhere classified  Stiffness of left knee, not elsewhere classified     Problem List Patient Active Problem List   Diagnosis Date Noted  . History of left below knee amputation (HCC) 06/04/2020  . 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 Moderate protein-calorie malnutrition (HCC) 04/13/2019  . Leg wound, left, subsequent encounter 10/20/2018  . S/P BKA (below knee amputation) unilateral, left (HCC) 08/26/2018  . Osteomyelitis (HCC) 08/01/2018  . Left foot and ankle infection 08/01/2018  . Cellulitis of left foot   . Chronic respiratory insufficiency 06/12/2014  . 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 Essential hypertension 01/25/2013    Vladimir Faster, PT, DPT 09/26/2020, 3:15 PM  [facility] [address] [place], [place], [address] Phone: [phone]   Fax:  [phone]  Name: Bruce Deleon MRN: [id number] Date of Birth: [birth year]

## 2020-10-01 ENCOUNTER — Encounter: Payer: Medicare Other | Admitting: Physical Therapy

## 2020-10-03 ENCOUNTER — Encounter: Payer: Medicare Other | Admitting: Physical Therapy

## 2020-10-08 ENCOUNTER — Encounter: Payer: Medicare Other | Admitting: Physical Therapy

## 2020-10-09 ENCOUNTER — Ambulatory Visit (INDEPENDENT_AMBULATORY_CARE_PROVIDER_SITE_OTHER): Payer: Medicare Other | Admitting: Physical Therapy

## 2020-10-09 ENCOUNTER — Encounter: Payer: Self-pay | Admitting: Physical Therapy

## 2020-10-09 ENCOUNTER — Other Ambulatory Visit: Payer: Self-pay

## 2020-10-09 DIAGNOSIS — M25661 Stiffness of right knee, not elsewhere classified: Secondary | ICD-10-CM

## 2020-10-09 DIAGNOSIS — R2681 Unsteadiness on feet: Secondary | ICD-10-CM | POA: Diagnosis not present

## 2020-10-09 DIAGNOSIS — M25662 Stiffness of left knee, not elsewhere classified: Secondary | ICD-10-CM

## 2020-10-09 DIAGNOSIS — M6281 Muscle weakness (generalized): Secondary | ICD-10-CM

## 2020-10-09 DIAGNOSIS — R2689 Other abnormalities of gait and mobility: Secondary | ICD-10-CM | POA: Diagnosis not present

## 2020-10-09 NOTE — Therapy (Signed)
[facility] [address] [place], [place], [address] Phone: [phone]   Fax:  [phone]  Physical Therapy Treatment  Patient Details  Name: Bruce Deleon MRN: [id number] Date of Birth: 11-Oct-1955 Referring Provider (PT): Doneen Poisson, MD   Encounter Date: 10/09/2020   PT End of Session - 10/09/20 1600    Visit Number 8    Number of Visits 50    Date for PT Re-Evaluation 11/15/20    Authorization Type UHC Medicare    PT Start Time 1515    PT Stop Time 1600    PT Time Calculation (min) 45 min    Equipment Utilized During Treatment Gait belt    Activity Tolerance Patient tolerated treatment well;Patient limited by fatigue    Behavior During Therapy [facility] for tasks assessed/performed           Past Medical History:  Diagnosis Date  . 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 ALS (amyotrophic lateral sclerosis) (HCC)   . Chronic respiratory insufficiency 06/12/2014  . Essential hypertension 01/25/2013    Past Surgical History:  Procedure Laterality Date  . AMPUTATION Left 08/06/2018   Procedure: LEFT BELOW KNEE AMPUTATION;  Surgeon: Kathryne Hitch, MD;  Location: [facility];  Service: Orthopedics;  Laterality: Left;  . APPLICATION OF WOUND VAC Left 08/04/2018   Procedure: APPLICATION OF WOUND VAC;  Surgeon: Kathryne Hitch, MD;  Location: [facility];  Service: Orthopedics;  Laterality: Left;  . I & D EXTREMITY Left 08/01/2018   Procedure: IRRIGATION AND DEBRIDEMENT foot and ankle amputation first and second rays;  Surgeon: Kathryne Hitch, MD;  Location: [facility];  Service: Orthopedics;  Laterality: Left;  . I & D EXTREMITY Left 08/04/2018   Procedure: REPEAT IRRIGATION AND DEBRIDEMENT OF LEFT ANKLE;  Surgeon: Kathryne Hitch, MD;  Location: [facility];  Service: Orthopedics;  Laterality: Left;    There were no vitals filed for this visit.   Subjective Assessment - 10/09/20 1517    Subjective He  has been having stomach issues which he has had intermittently for 2 years. He wore prosthesis most of awake hours when out of bed even when not feeling well.    Patient is accompained by: Family member   [name], wife   Pertinent History left TTA, ALS ~76yrs with rt side weakness > lt side, HTN,    Patient Stated Goals to use prosthesis to get out of w/c, drive,    Currently in Pain? No/denies    Pain Onset 1 to 4 weeks ago                             [facility] Adult PT Treatment/Exercise - 10/09/20 1515      Transfers   Transfers Sit to Stand;Stand to Sit;Squat Pivot Transfers    Sit to Stand 4: Min guard;5: Supervision;With upper extremity assist;With armrests;From chair/3-in-1;Other (comment)   to RW   Stand to Sit 5: Supervision;4: Min guard;With upper extremity assist;With armrests;To chair/3-in-1;Other (comment)   from RW     Ambulation/Gait   Ambulation/Gait Yes    Ambulation/Gait Assistance 4: Min assist;5: Supervision    Ambulation/Gait Assistance Details tactile & verbal cues on upright posture, step width & wt shift over prosthesis in stance.    Ambulation Distance (Feet) 50 Feet   50' X 2   Assistive device Rolling walker;Prosthesis    Ambulation Surface Level;Indoor    Curb 3: Mod assist  2nd person stand by for safety, RW & TTA prosthesis   Curb Details (indicate cue type and reason) demo, verbal & manual cues on technique.    Gait Comments ONLY when family available for assistance, walking program of short walk (room to room) goal to increase frequency,  long walk (max tolerable distance) 1-2 X/day and medium (bw short & long) 4-6 x/day.  Pt & wife verbalized understanding.      Self-Care   Self-Care --    Other Self-Care Comments  --      Neuro Re-ed    Neuro Re-ed Details  seated on 24" bar stool with Bil feet supported on floor: red theraband alternating UEs & BUEs rows & forward punch 10 reps ea.  PT able to palpate core & LE muscles activated  for stabilization.      Exercises   Exercises Knee/Hip      Knee/Hip Exercises: Stretches   Passive Hamstring Stretch Left;1 rep;60 seconds    Passive Hamstring Stretch Limitations supine with LE extended on leg press with PT light end range stretch      Knee/Hip Exercises: Aerobic   Nustep --      Knee/Hip Exercises: Machines for Strengthening   Cybex Leg Press --      Prosthetics   Current prosthetic wear tolerance (days/week)  daily    Current prosthetic wear tolerance (#hours/day)  most of awake hours except 1hr 3x    Residual limb condition  No open areas, prominent tibia with minimal soft tissue, dry distal skin scaly, scar invaginated & adhered at distal tibia,  cylinderical shape    Education Provided Proper wear schedule/adjustment;Other (comment);Residual limb care   see prosthetic care comments                   PT Short Term Goals - 09/25/20 1633      PT SHORT TERM GOAL #1   Title Patient & wife verbalize how to properly adjust ply socks.    Time 4    Period Weeks    Status New    Target Date 10/18/20      PT SHORT TERM GOAL #2   Title Patient tolerates prosthesis wear daily for >90% of awake hours without skin issues.    Time 4    Period Weeks    Status Revised    Target Date 10/18/20      PT SHORT TERM GOAL #3   Title Patient ambulates 59' with RW & prosthesis with minA    Time 4    Period Weeks    Status New    Target Date 10/18/20             PT Long Term Goals - 09/25/20 1630      PT LONG TERM GOAL #1   Title Patient & wife demonstrate and verbalize understanding of proper prosthetic care to enable safe utililzation of prosthesis.    Time 6    Period Months    Status On-going    Target Date 02/13/21      PT LONG TERM GOAL #2   Title Patient tolerates prosthesis wear >90% of awake hours without skin or limb pain issues to enable function throughout his day.    Time 6    Period Months    Status On-going    Target Date 02/13/21       PT LONG TERM GOAL #3   Title Patient able to transfer w/c to chair or toilet with prosthesis  modified independent.    Time 6    Period Months    Status On-going    Target Date 02/13/21      PT LONG TERM GOAL #4   Title Sit to/from stand transfers and standing balance for ADLs with RW support wtih prosthesis modified independent.    Time 6    Period Months    Status Revised    Target Date 02/13/21      PT LONG TERM GOAL #5   Title Patient ambulates 150' with RW & prosthesis with supervision    Time 6    Period Months    Status Revised    Target Date 02/13/21                 Plan - 10/09/20 1600    Clinical Impression Statement Patient has improved prosthetic gait and is safe to ambulate with family. PT introduced curbs today, he required modA.    Personal Factors and Comorbidities Fitness;Time since onset of injury/illness/exacerbation;Comorbidity 2    Comorbidities ALS, HTN    Examination-Activity Limitations Lift;Locomotion Level;Stairs;Stand;Transfers    Examination-Participation Restrictions Community Activity    Stability/Clinical Decision Making Evolving/Moderate complexity    Rehab Potential Good    PT Frequency 2x / week    PT Duration Other (comment)   6 months   PT Treatment/Interventions ADLs/Self Care Home Management;DME Instruction;Gait training;Stair training;Functional mobility training;Therapeutic activities;Therapeutic exercise;Balance training;Neuromuscular re-education;Patient/family education;Prosthetic Training;Manual techniques;Scar mobilization;Passive range of motion    PT Next Visit Plan review prosthetic care, work on standing balance,    Consulted and Agree with Plan of Care Patient;Family member/caregiver    Family Member Consulted wife, Axxel Gude           Patient will benefit from skilled therapeutic intervention in order to improve the following deficits and impairments:  Abnormal gait,Cardiopulmonary status limiting  activity,Decreased activity tolerance,Decreased balance,Decreased endurance,Decreased knowledge of use of DME,Decreased mobility,Decreased range of motion,Decreased skin integrity,Decreased scar mobility,Decreased strength,Difficulty walking,Impaired flexibility,Impaired UE functional use,Postural dysfunction,Prosthetic Dependency  Visit Diagnosis: Muscle weakness (generalized)  Unsteadiness on feet  Other abnormalities of gait and mobility  Stiffness of right knee, not elsewhere classified  Stiffness of left knee, not elsewhere classified     Problem List Patient Active Problem List   Diagnosis Date Noted  . History of left below knee amputation (HCC) 06/04/2020  . 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 Moderate protein-calorie malnutrition (HCC) 04/13/2019  . Leg wound, left, subsequent encounter 10/20/2018  . S/P BKA (below knee amputation) unilateral, left (HCC) 08/26/2018  . Osteomyelitis (HCC) 08/01/2018  . Left foot and ankle infection 08/01/2018  . Cellulitis of left foot   . Chronic respiratory insufficiency 06/12/2014  . 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 Essential hypertension 01/25/2013    Vladimir Faster, PT, DPT 10/09/2020, 4:17 PM  [facility] [address] [place], [place], [address] Phone: [phone]   Fax:  [phone]  Name: MASSEY RUHLAND MRN: [id number] Date of Birth: Dec 11, 1955

## 2020-10-10 ENCOUNTER — Ambulatory Visit (INDEPENDENT_AMBULATORY_CARE_PROVIDER_SITE_OTHER): Payer: Medicare Other | Admitting: Physical Therapy

## 2020-10-10 ENCOUNTER — Encounter: Payer: Self-pay | Admitting: Physical Therapy

## 2020-10-10 DIAGNOSIS — R2681 Unsteadiness on feet: Secondary | ICD-10-CM | POA: Diagnosis not present

## 2020-10-10 DIAGNOSIS — R2689 Other abnormalities of gait and mobility: Secondary | ICD-10-CM | POA: Diagnosis not present

## 2020-10-10 DIAGNOSIS — M6281 Muscle weakness (generalized): Secondary | ICD-10-CM | POA: Diagnosis not present

## 2020-10-10 DIAGNOSIS — M25662 Stiffness of left knee, not elsewhere classified: Secondary | ICD-10-CM

## 2020-10-10 DIAGNOSIS — M25661 Stiffness of right knee, not elsewhere classified: Secondary | ICD-10-CM | POA: Diagnosis not present

## 2020-10-10 NOTE — Therapy (Signed)
[facility] Physical Therapy [address] [place], [place], [address] Phone: [phone]   Fax:  [phone]  Physical Therapy Treatment  Patient Details  Name: Bruce Deleon MRN: [id number] Date of Birth: May 16, 1956 Referring Provider (PT): Bruce Poisson, MD   Encounter Date: 10/10/2020   PT End of Session - 10/10/20 1431    Visit Number 9    Number of Visits 50    Date for PT Re-Evaluation 11/15/20    Authorization Type [employer] Medicare    PT Start Time 1430    PT Stop Time 1515    PT Time Calculation (min) 45 min    Equipment Utilized During Treatment Gait belt    Activity Tolerance Patient tolerated treatment well;Patient limited by fatigue    Behavior During Therapy [facility] for tasks assessed/performed           Past Medical History:  Diagnosis Date  . 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 ALS (amyotrophic lateral sclerosis) (HCC)   . Chronic respiratory insufficiency 06/12/2014  . Essential hypertension 01/25/2013    Past Surgical History:  Procedure Laterality Date  . AMPUTATION Left 08/06/2018   Procedure: LEFT BELOW KNEE AMPUTATION;  Surgeon: Bruce Hitch, MD;  Location: [facility];  Service: Orthopedics;  Laterality: Left;  . APPLICATION OF WOUND VAC Left 08/04/2018   Procedure: APPLICATION OF WOUND VAC;  Surgeon: Bruce Hitch, MD;  Location: [facility];  Service: Orthopedics;  Laterality: Left;  . I & D EXTREMITY Left 08/01/2018   Procedure: IRRIGATION AND DEBRIDEMENT foot and ankle amputation first and second rays;  Surgeon: Bruce Hitch, MD;  Location: [facility];  Service: Orthopedics;  Laterality: Left;  . I & D EXTREMITY Left 08/04/2018   Procedure: REPEAT IRRIGATION AND DEBRIDEMENT OF LEFT ANKLE;  Surgeon: Bruce Hitch, MD;  Location: [facility];  Service: Orthopedics;  Laterality: Left;    There were no vitals filed for this visit.   Subjective Assessment - 10/10/20 1430    Subjective He  was tired after yesterday a little.  He has some muscle soreness in abdominals.    Patient is accompained by: Family member   Bruce Deleon, wife   Pertinent History left TTA, ALS ~56yrs with rt side weakness > lt side, HTN,    Patient Stated Goals to use prosthesis to get out of w/c, drive,    Currently in Pain? No/denies    Pain Onset 1 to 4 weeks ago                             [facility] Adult PT Treatment/Exercise - 10/10/20 1430      Transfers   Transfers Sit to Stand;Stand to Sit;Squat Pivot Transfers    Sit to Stand 5: Supervision;With upper extremity assist;With armrests;From chair/3-in-1;Other (comment)   to RW   Sit to Stand Details Visual cues for safe use of DME/AE;Verbal cues for sequencing;Visual cues/gestures for sequencing    Stand to Sit 5: Supervision;4: Min guard;With upper extremity assist;With armrests;To chair/3-in-1;Other (comment)   from RW   Stand to Sit Details (indicate cue type and reason) Visual cues for safe use of DME/AE;Verbal cues for sequencing;Verbal cues for technique    Squat Pivot Transfers 5: Supervision;With upper extremity assistance;With armrests   with RW     Ambulation/Gait   Ambulation/Gait Yes    Ambulation/Gait Assistance 5: Supervision    Ambulation/Gait Assistance Details PT demo & verbal cues on how wife  to assist including set up at home.    Ambulation Distance (Feet) 50 Feet   50' X 2   Assistive device Rolling walker;Prosthesis    Ambulation Surface Level;Indoor    Curb --    Gait Comments ONLY when family available for assistance, walking program of short walk (room to room) goal to increase frequency,  long walk (max tolerable distance) 1-2 X/day and medium (bw short & long) 4-6 x/day.  Pt & wife verbalized understanding.      Neuro Re-ed    Neuro Re-ed Details  seated on 24" bar stool with Bil feet supported on floor: red theraband alternating UEs & BUEs rows & forward punch 10 reps ea.  PT able to palpate core & LE  muscles activated for stabilization.      Exercises   Exercises Knee/Hip      Knee/Hip Exercises: Stretches   Passive Hamstring Stretch --    Passive Hamstring Stretch Limitations --      Knee/Hip Exercises: Seated   Sit to Sand 1 set;5 reps;with UE support   to RW cues on technique with close supervision     Prosthetics   Current prosthetic wear tolerance (days/week)  daily    Current prosthetic wear tolerance (#hours/day)  most of awake hours except 1hr 3x    Residual limb condition  No open areas, prominent tibia with minimal soft tissue, dry distal skin scaly, scar invaginated & adhered at distal tibia,  cylinderical shape    Education Provided Proper wear schedule/adjustment;Residual limb care;Proper Donning    Person(s) Educated Patient;Spouse    Education Method Explanation;Verbal cues;Demonstration    Education Method Verbalized understanding;Verbal cues required;Needs further Soil scientist Prosthesis Supervision                    PT Short Term Goals - 09/25/20 1633      PT SHORT TERM GOAL #1   Title Patient & wife verbalize how to properly adjust ply socks.    Time 4    Period Weeks    Status New    Target Date 10/18/20      PT SHORT TERM GOAL #2   Title Patient tolerates prosthesis wear daily for >90% of awake hours without skin issues.    Time 4    Period Weeks    Status Revised    Target Date 10/18/20      PT SHORT TERM GOAL #3   Title Patient ambulates 15' with RW & prosthesis with minA    Time 4    Period Weeks    Status New    Target Date 10/18/20             PT Long Term Goals - 09/25/20 1630      PT LONG TERM GOAL #1   Title Patient & wife demonstrate and verbalize understanding of proper prosthetic care to enable safe utililzation of prosthesis.    Time 6    Period Months    Status On-going    Target Date 02/13/21      PT LONG TERM GOAL #2   Title Patient tolerates prosthesis wear >90% of awake hours without skin or  limb pain issues to enable function throughout his day.    Time 6    Period Months    Status On-going    Target Date 02/13/21      PT LONG TERM GOAL #3   Title Patient able to transfer w/c to chair or  toilet with prosthesis modified independent.    Time 6    Period Months    Status On-going    Target Date 02/13/21      PT LONG TERM GOAL #4   Title Sit to/from stand transfers and standing balance for ADLs with RW support wtih prosthesis modified independent.    Time 6    Period Months    Status Revised    Target Date 02/13/21      PT LONG TERM GOAL #5   Title Patient ambulates 150' with RW & prosthesis with supervision    Time 6    Period Months    Status Revised    Target Date 02/13/21                 Plan - 10/10/20 1432    Clinical Impression Statement Patient improved sit to/from stand chairs with armrests to RW with instruction & repetition.  His wife seems safe to assist with limited distance gait at home.    Personal Factors and Comorbidities Fitness;Time since onset of injury/illness/exacerbation;Comorbidity 2    Comorbidities ALS, HTN    Examination-Activity Limitations Lift;Locomotion Level;Stairs;Stand;Transfers    Examination-Participation Restrictions Community Activity    Stability/Clinical Decision Making Evolving/Moderate complexity    Rehab Potential Good    PT Frequency 2x / week    PT Duration Other (comment)   6 months   PT Treatment/Interventions ADLs/Self Care Home Management;DME Instruction;Gait training;Stair training;Functional mobility training;Therapeutic activities;Therapeutic exercise;Balance training;Neuromuscular re-education;Patient/family education;Prosthetic Training;Manual techniques;Scar mobilization;Passive range of motion    PT Next Visit Plan review prosthetic care, work on standing balance & prosthetic gait, check STGs    Consulted and Agree with Plan of Care Patient;Family member/caregiver    Family Member Consulted wife, Daeton Kluth           Patient will benefit from skilled therapeutic intervention in order to improve the following deficits and impairments:  Abnormal gait,Cardiopulmonary status limiting activity,Decreased activity tolerance,Decreased balance,Decreased endurance,Decreased knowledge of use of DME,Decreased mobility,Decreased range of motion,Decreased skin integrity,Decreased scar mobility,Decreased strength,Difficulty walking,Impaired flexibility,Impaired UE functional use,Postural dysfunction,Prosthetic Dependency  Visit Diagnosis: Muscle weakness (generalized)  Unsteadiness on feet  Other abnormalities of gait and mobility  Stiffness of right knee, not elsewhere classified  Stiffness of left knee, not elsewhere classified     Problem List Patient Active Problem List   Diagnosis Date Noted  . History of left below knee amputation (HCC) 06/04/2020  . 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 Moderate protein-calorie malnutrition (HCC) 04/13/2019  . Leg wound, left, subsequent encounter 10/20/2018  . S/P BKA (below knee amputation) unilateral, left (HCC) 08/26/2018  . Osteomyelitis (HCC) 08/01/2018  . Left foot and ankle infection 08/01/2018  . Cellulitis of left foot   . Chronic respiratory insufficiency 06/12/2014  . 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 Essential hypertension 01/25/2013    Stana Bunting, DPT 10/10/2020, 5:25 PM  [facility] Physical Therapy [address] [place], [place], [address] Phone: [phone]   Fax:  [phone]  Name: Bruce Deleon MRN: [id number] Date of Birth: 1956-05-13

## 2020-10-15 ENCOUNTER — Encounter: Payer: Medicare Other | Admitting: Physical Therapy

## 2020-10-16 ENCOUNTER — Encounter: Payer: Medicare Other | Admitting: Physical Therapy

## 2020-10-17 ENCOUNTER — Encounter: Payer: Medicare Other | Admitting: Physical Therapy

## 2020-10-23 ENCOUNTER — Encounter: Payer: Self-pay | Admitting: Physical Therapy

## 2020-10-23 ENCOUNTER — Other Ambulatory Visit: Payer: Self-pay

## 2020-10-23 ENCOUNTER — Ambulatory Visit (INDEPENDENT_AMBULATORY_CARE_PROVIDER_SITE_OTHER): Payer: Medicare Other | Admitting: Physical Therapy

## 2020-10-23 DIAGNOSIS — R2681 Unsteadiness on feet: Secondary | ICD-10-CM | POA: Diagnosis not present

## 2020-10-23 DIAGNOSIS — R2689 Other abnormalities of gait and mobility: Secondary | ICD-10-CM

## 2020-10-23 DIAGNOSIS — M25661 Stiffness of right knee, not elsewhere classified: Secondary | ICD-10-CM | POA: Diagnosis not present

## 2020-10-23 DIAGNOSIS — M6281 Muscle weakness (generalized): Secondary | ICD-10-CM | POA: Diagnosis not present

## 2020-10-23 DIAGNOSIS — M25662 Stiffness of left knee, not elsewhere classified: Secondary | ICD-10-CM

## 2020-10-23 NOTE — Therapy (Signed)
[facility] Physical Therapy [address] [place], [place], [address] Phone: [phone]   Fax:  [phone]  Physical Therapy Treatment/Progress note Progress Note reporting period date 08/22/20 to 10/23/20  See below for objective and subjective measurements relating to patients progress with PT.   Patient Details  Name: Bruce Deleon MRN: [id number] Date of Birth: 04/24/1956 Referring Provider (PT): Jean Rosenthal, MD   Encounter Date: 10/23/2020   PT End of Session - 10/23/20 1654    Visit Number 10    Number of Visits 50    Date for PT Re-Evaluation 11/15/20    Authorization Type [employer] Medicare    Progress Note Due on Visit 20    PT Start Time 1515    PT Stop Time 1600    PT Time Calculation (min) 45 min    Equipment Utilized During Treatment Gait belt    Activity Tolerance Patient tolerated treatment well;Patient limited by fatigue    Behavior During Therapy WFL for tasks assessed/performed           Past Medical History:  Diagnosis Date  . ALS (amyotrophic lateral sclerosis) ([place]) 01/25/2013  . ALS (amyotrophic lateral sclerosis) ([place])   . Chronic respiratory insufficiency 06/12/2014  . Essential hypertension 01/25/2013    Past Surgical History:  Procedure Laterality Date  . AMPUTATION Left 08/06/2018   Procedure: LEFT BELOW KNEE AMPUTATION;  Surgeon: Mcarthur Rossetti, MD;  [facility];  Service: Orthopedics;  Laterality: Left;  . APPLICATION OF WOUND VAC Left 08/04/2018   Procedure: APPLICATION OF WOUND VAC;  Surgeon: Mcarthur Rossetti, MD;  [facility];  Service: Orthopedics;  Laterality: Left;  . I & D EXTREMITY Left 08/01/2018   Procedure: IRRIGATION AND DEBRIDEMENT foot and ankle amputation first and second rays;  Surgeon: Mcarthur Rossetti, MD;  [facility];  Service: Orthopedics;  Laterality: Left;  . I & D EXTREMITY Left 08/04/2018   Procedure: REPEAT IRRIGATION AND DEBRIDEMENT OF LEFT ANKLE;  Surgeon:  Mcarthur Rossetti, MD;  [facility];  Service: Orthopedics;  Laterality: Left;    There were no vitals filed for this visit.   Subjective Assessment - 10/23/20 1524    Subjective He denies pain, feels he has improved with ability to sit to stand, and stand at sink longer, he is walking some when wife is there to help. He does relay he is still limited with fatigue and pain in his Rt knee.    Patient is accompained by: Family member   [employer], wife   Pertinent History sink    Patient Stated Goals to use prosthesis to get out of w/c, drive,    Pain Onset 1 to 4 weeks ago              [facility] PT Assessment - 10/23/20 0001      Assessment   Medical Diagnosis Left Transtibial Amputation    Referring Provider (PT) Jean Rosenthal, MD    Onset Date/Surgical Date 07/11/20      PROM   Left Knee Extension -15   seated with prosthetic heel resting on floor     Strength   Left Hip Flexion 4/5    Left Hip Extension 4-/5      Transfers   Transfers Sit to Stand;Stand to Sit;Squat Pivot Transfers    Sit to Stand 5: Supervision;With upper extremity assist;With armrests;From chair/3-in-1;Other (comment)    Sit to Stand Details Visual cues for safe use of DME/AE;Verbal cues for sequencing;Visual cues/gestures  for sequencing    Stand to Sit 5: Supervision;4: Min guard;With upper extremity assist;With armrests;To chair/3-in-1;Other (comment)    Stand to Sit Details (indicate cue type and reason) Visual cues for safe use of DME/AE;Verbal cues for sequencing;Verbal cues for technique    Squat Pivot Transfers 5: Supervision;With upper extremity assistance;With armrests      Ambulation/Gait   Ambulation/Gait Yes    Ambulation/Gait Assistance 5: Supervision    Ambulation Distance (Feet) 126 Feet   max tolerated distance   Assistive device Rolling walker;Prosthesis    Ambulation Surface Level;Indoor                         [facility] Adult PT Treatment/Exercise -  10/23/20 0001      Neuro Re-ed    Neuro Re-ed Details  seated on bar stool unilat Rows and chest press X20 bilat with red band. Standing balance with normal BOS no UE support in bars 30 sec X 3 with min guard to min A      Exercises   Exercises Other Exercises    Other Exercises  Sit to stands from bar stool 3X5 reps with as little UE support as possible.      Knee/Hip Exercises: Stretches   Passive Hamstring Stretch Left;3 reps;30 seconds                    PT Short Term Goals - 10/23/20 1658      PT SHORT TERM GOAL #1   Title Patient & wife verbalize how to properly adjust ply socks.    Time 4    Period Weeks    Status Achieved    Target Date 10/18/20      PT SHORT TERM GOAL #2   Title Patient tolerates prosthesis wear daily for >90% of awake hours without skin issues.    Time 4    Period Weeks    Status Achieved    Target Date 10/18/20      PT SHORT TERM GOAL #3   Title Patient ambulates 52' with RW & prosthesis with minA    Baseline walked 126 ft on 4/12    Time 4    Period Weeks    Status Achieved    Target Date 10/18/20      PT SHORT TERM GOAL #4   Title w/c to level surface mat table scooting transfer using prosthesis with modA    Baseline MET 09/19/2020    Status Achieved             PT Long Term Goals - 09/25/20 1630      PT LONG TERM GOAL #1   Title Patient & wife demonstrate and verbalize understanding of proper prosthetic care to enable safe utililzation of prosthesis.    Time 6    Period Months    Status On-going    Target Date 02/13/21      PT LONG TERM GOAL #2   Title Patient tolerates prosthesis wear >90% of awake hours without skin or limb pain issues to enable function throughout his day.    Time 6    Period Months    Status On-going    Target Date 02/13/21      PT LONG TERM GOAL #3   Title Patient able to transfer w/c to chair or toilet with prosthesis modified independent.    Time 6    Period Months    Status On-going     Target  Date 02/13/21      PT LONG TERM GOAL #4   Title Sit to/from stand transfers and standing balance for ADLs with RW support wtih prosthesis modified independent.    Time 6    Period Months    Status Revised    Target Date 02/13/21      PT LONG TERM GOAL #5   Title Patient ambulates 150' with RW & prosthesis with supervision    Time 6    Period Months    Status Revised    Target Date 02/13/21                 Plan - 10/23/20 1656    Clinical Impression Statement Progress note reflects that he has improved with overall activity tolerance and knee extension ROM however still limited with these. He has now met all of short term goals. He will continue to benefit from skilled PT to address his remaining deficits and work toward long term goals.    Personal Factors and Comorbidities Fitness;Time since onset of injury/illness/exacerbation;Comorbidity 2    Comorbidities ALS, HTN    Examination-Activity Limitations Lift;Locomotion Level;Stairs;Stand;Transfers    Examination-Participation Restrictions Community Activity    Stability/Clinical Decision Making Evolving/Moderate complexity    Rehab Potential Good    PT Frequency 2x / week    PT Duration Other (comment)   6 months   PT Treatment/Interventions ADLs/Self Care Home Management;DME Instruction;Gait training;Stair training;Functional mobility training;Therapeutic activities;Therapeutic exercise;Balance training;Neuromuscular re-education;Patient/family education;Prosthetic Training;Manual techniques;Scar mobilization;Passive range of motion    PT Next Visit Plan review prosthetic care, work on standing balance & prosthetic gai    Consulted and Agree with Plan of Care Patient;Family member/caregiver    Family Member Consulted wife, Arron Tetrault           Patient will benefit from skilled therapeutic intervention in order to improve the following deficits and impairments:  Abnormal gait,Cardiopulmonary status limiting  activity,Decreased activity tolerance,Decreased balance,Decreased endurance,Decreased knowledge of use of DME,Decreased mobility,Decreased range of motion,Decreased skin integrity,Decreased scar mobility,Decreased strength,Difficulty walking,Impaired flexibility,Impaired UE functional use,Postural dysfunction,Prosthetic Dependency  Visit Diagnosis: Muscle weakness (generalized)  Unsteadiness on feet  Other abnormalities of gait and mobility  Stiffness of right knee, not elsewhere classified  Stiffness of left knee, not elsewhere classified     Problem List Patient Active Problem List   Diagnosis Date Noted  . History of left below knee amputation ([place]) 06/04/2020  . Phantom pain after amputation of lower extremity ([place]) 04/13/2019  . Moderate protein-calorie malnutrition ([place]) 04/13/2019  . Leg wound, left, subsequent encounter 10/20/2018  . S/P BKA (below knee amputation) unilateral, left ([place]) 08/26/2018  . Osteomyelitis ([place]) 08/01/2018  . Left foot and ankle infection 08/01/2018  . Cellulitis of left foot   . Chronic respiratory insufficiency 06/12/2014  . ALS (amyotrophic lateral sclerosis) ([place]) 01/25/2013  . Essential hypertension 01/25/2013    Debbe Odea 10/23/2020, 4:58 PM  [facility] Physical Therapy [address] [place], [place], [address] Phone: [phone]   Fax:  [phone]  Name: LEE KUANG MRN: [id number] Date of Birth: Dec 06, 1955

## 2020-10-30 ENCOUNTER — Encounter: Payer: Self-pay | Admitting: Physical Therapy

## 2020-10-30 ENCOUNTER — Ambulatory Visit (INDEPENDENT_AMBULATORY_CARE_PROVIDER_SITE_OTHER): Payer: Medicare Other | Admitting: Physical Therapy

## 2020-10-30 ENCOUNTER — Other Ambulatory Visit: Payer: Self-pay

## 2020-10-30 DIAGNOSIS — M6281 Muscle weakness (generalized): Secondary | ICD-10-CM | POA: Diagnosis not present

## 2020-10-30 DIAGNOSIS — R2681 Unsteadiness on feet: Secondary | ICD-10-CM

## 2020-10-30 DIAGNOSIS — R2689 Other abnormalities of gait and mobility: Secondary | ICD-10-CM

## 2020-10-30 DIAGNOSIS — M25662 Stiffness of left knee, not elsewhere classified: Secondary | ICD-10-CM

## 2020-10-30 DIAGNOSIS — M25661 Stiffness of right knee, not elsewhere classified: Secondary | ICD-10-CM | POA: Diagnosis not present

## 2020-10-30 NOTE — Therapy (Signed)
[facility] Physical Therapy [address] [place], [place], [address] Phone: [phone]   Fax:  [phone]  Physical Therapy Treatment  Patient Details  Name: Bruce Deleon MRN: [id number] Date of Birth: [birth year] Referring Provider (PT): Doneen Poisson, MD   Encounter Date: 10/30/2020   PT End of Session - 10/30/20 1526    Visit Number 11    Number of Visits 50    Date for PT Re-Evaluation 11/15/20    Authorization Type [employer] Medicare    Progress Note Due on Visit 20    PT Start Time 1515    PT Stop Time 1602    PT Time Calculation (min) 47 min    Equipment Utilized During Treatment Gait belt    Activity Tolerance Patient tolerated treatment well;Patient limited by fatigue    Behavior During Therapy [facility] for tasks assessed/performed           Past Medical History:  Diagnosis Date  . 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 ALS (amyotrophic lateral sclerosis) (HCC)   . Chronic respiratory insufficiency 06/12/2014  . Essential hypertension 01/25/2013    Past Surgical History:  Procedure Laterality Date  . AMPUTATION Left 08/06/2018   Procedure: LEFT BELOW KNEE AMPUTATION;  Surgeon: Kathryne Hitch, MD;  Location: [facility];  Service: Orthopedics;  Laterality: Left;  . APPLICATION OF WOUND VAC Left 08/04/2018   Procedure: APPLICATION OF WOUND VAC;  Surgeon: Kathryne Hitch, MD;  Location: [facility];  Service: Orthopedics;  Laterality: Left;  . I & D EXTREMITY Left 08/01/2018   Procedure: IRRIGATION AND DEBRIDEMENT foot and ankle amputation first and second rays;  Surgeon: Kathryne Hitch, MD;  Location: [facility];  Service: Orthopedics;  Laterality: Left;  . I & D EXTREMITY Left 08/04/2018   Procedure: REPEAT IRRIGATION AND DEBRIDEMENT OF LEFT ANKLE;  Surgeon: Kathryne Hitch, MD;  Location: [facility];  Service: Orthopedics;  Laterality: Left;    There were no vitals filed for this visit.   Subjective Assessment  - 10/30/20 1515    Subjective He has been wearing prosthesis most of awake hours. He is limited in amount of walking by wife's work schedule & she is carrying for her mother also.    Patient is accompained by: Family member   [name], wife   Pertinent History sink    Patient Stated Goals to use prosthesis to get out of w/c, drive,    Currently in Pain? Yes    Pain Score 6    standing at sink pain 3/10, initial few steps of walking 6/10 then settles to 4/10. sitting is 0/10 after 1-2 minutes   Pain Location Knee    Pain Orientation Right    Pain Descriptors / Indicators Aching    Pain Type Acute pain    Pain Onset 1 to 4 weeks ago    Pain Frequency Intermittent    Aggravating Factors  walking esp initial steps & standing    Pain Relieving Factors sitting                             [facility] Adult PT Treatment/Exercise - 10/30/20 1515      Transfers   Transfers Sit to Stand;Stand to Sit;Squat Pivot Transfers    Sit to Stand 5: Supervision;With upper extremity assist;With armrests;From chair/3-in-1;Other (comment)   to sink   Sit to Stand Details --    Stand to Sit 5: Supervision;With upper extremity  assist;With armrests;To chair/3-in-1   from sink   Stand to Sit Details (indicate cue type and reason) --    Squat Pivot Transfers --      Ambulation/Gait   Ambulation/Gait --    Ambulation/Gait Assistance --    Ambulation Distance (Feet) --    Assistive device --      Neuro Re-ed    Neuro Re-ed Details  see pt instructions for updated HEP      Exercises   Exercises --    Other Exercises  --      Knee/Hip Exercises: Stretches   Active Hamstring Stretch Right;Left;2 reps;30 seconds    Active Hamstring Stretch Limitations seated at edge of chair with leg extended looped sheet to DF    Passive Hamstring Stretch --               Access Code: [email or web address] URL: [email or web address] Date: 10/30/2020 Prepared by: Vladimir Fasterobin  Jevaughn Degollado  Exercises Seated Hamstring Stretch with Chair - 2 x daily - 7 x weekly - 1 sets - 1 reps - 5 minutes hold Seated Hamstring Stretch with Strap - 2 x daily - 7 x weekly - 3 reps - 1 sets - 30 seconds hold Sit to Stand with Counter Support - 1 x daily - 7 x weekly - 1 sets - 10 reps - 5 seconds hold Standing hip abduction alternating legs (modified with RLE as step) - 1 x daily - 7 x weekly - 2-3 sets - 3 reps - 5 seconds hold Standing hip extension alternating legs (modified with RLE as step)  - 1 x daily - 7 x weekly - 2-3 sets - 3 reps - 5 seconds hold  Can do seated at of edge of chair if alone or on 24" bar stool if wife is able to supervise / assist. Seated Hip Flexion Toward Target - 1 x daily - 7 x weekly - 10 reps - 1 sets - 5 seconds hold Seated Eccentric Abdominal Lean Back - 1 x daily - 7 x weekly - 10 reps - 1 sets - 5 seconds hold Seated Sidebending - 1 x daily - 7 x weekly - 10 reps - 1 sets - 5 seconds hold Seated Trunk Rotation with Crossed Arms - 1 x daily - 7 x weekly - 1 sets - 10 reps - 5 seconds hold     PT Education - 10/30/20 1615    Education Details updated HEP  Access Code: [email or web address]    Person(s) Educated Patient;Spouse    Methods Explanation;Demonstration;Tactile cues;Verbal cues;Handout    Comprehension Verbalized understanding;Returned demonstration;Verbal cues required;Tactile cues required;Need further instruction            PT Short Term Goals - 10/30/20 1616      PT SHORT TERM GOAL #1   Title Patient demo donning prosthesis without assistance    Time 4    Period Weeks    Status Revised    Target Date 11/15/20      PT SHORT TERM GOAL #2   Title Patient sit to / from stand chair with armrests to RW with supervision.    Time 4    Period Weeks    Status New    Target Date 11/15/20      PT SHORT TERM GOAL #3   Title Patient ambulates 150' with RW & prosthesis with min guard    Time 4    Period Weeks    Status Revised    Target Date  11/15/20  PT Long Term Goals - 09/25/20 1630      PT LONG TERM GOAL #1   Title Patient & wife demonstrate and verbalize understanding of proper prosthetic care to enable safe utililzation of prosthesis.    Time 6    Period Months    Status On-going    Target Date 02/13/21      PT LONG TERM GOAL #2   Title Patient tolerates prosthesis wear >90% of awake hours without skin or limb pain issues to enable function throughout his day.    Time 6    Period Months    Status On-going    Target Date 02/13/21      PT LONG TERM GOAL #3   Title Patient able to transfer w/c to chair or toilet with prosthesis modified independent.    Time 6    Period Months    Status On-going    Target Date 02/13/21      PT LONG TERM GOAL #4   Title Sit to/from stand transfers and standing balance for ADLs with RW support wtih prosthesis modified independent.    Time 6    Period Months    Status Revised    Target Date 02/13/21      PT LONG TERM GOAL #5   Title Patient ambulates 150' with RW & prosthesis with supervision    Time 6    Period Months    Status Revised    Target Date 02/13/21                 Plan - 10/30/20 1526    Clinical Impression Statement PT updated HEP and he appears to understand the new program.    Personal Factors and Comorbidities Fitness;Time since onset of injury/illness/exacerbation;Comorbidity 2    Comorbidities ALS, HTN    Examination-Activity Limitations Lift;Locomotion Level;Stairs;Stand;Transfers    Examination-Participation Restrictions Community Activity    Stability/Clinical Decision Making Evolving/Moderate complexity    Rehab Potential Good    PT Frequency 2x / week    PT Duration Other (comment)   6 months   PT Treatment/Interventions ADLs/Self Care Home Management;DME Instruction;Gait training;Stair training;Functional mobility training;Therapeutic activities;Therapeutic exercise;Balance training;Neuromuscular re-education;Patient/family  education;Prosthetic Training;Manual techniques;Scar mobilization;Passive range of motion    PT Next Visit Plan work towards updated STGs with target 5/5 (recert at that time).  review prosthetic care, work on standing balance & prosthetic gai    Consulted and Agree with Plan of Care Patient;Family member/caregiver    Family Member Consulted wife, Platon Arocho           Patient will benefit from skilled therapeutic intervention in order to improve the following deficits and impairments:  Abnormal gait,Cardiopulmonary status limiting activity,Decreased activity tolerance,Decreased balance,Decreased endurance,Decreased knowledge of use of DME,Decreased mobility,Decreased range of motion,Decreased skin integrity,Decreased scar mobility,Decreased strength,Difficulty walking,Impaired flexibility,Impaired UE functional use,Postural dysfunction,Prosthetic Dependency  Visit Diagnosis: Muscle weakness (generalized)  Unsteadiness on feet  Other abnormalities of gait and mobility  Stiffness of right knee, not elsewhere classified  Stiffness of left knee, not elsewhere classified     Problem List Patient Active Problem List   Diagnosis Date Noted  . History of left below knee amputation (HCC) 06/04/2020  . 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 Moderate protein-calorie malnutrition (HCC) 04/13/2019  . Leg wound, left, subsequent encounter 10/20/2018  . S/P BKA (below knee amputation) unilateral, left (HCC) 08/26/2018  . Osteomyelitis (HCC) 08/01/2018  . Left foot and ankle infection 08/01/2018  . Cellulitis of left  foot   . Chronic respiratory insufficiency 06/12/2014  . 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 Essential hypertension 01/25/2013    Vladimir Faster, PT, DPT 10/30/2020, 4:23 PM  [facility] Physical Therapy [address] [place], [place], [address] Phone: [phone]   Fax:  [phone]  Name: Bruce Deleon MRN: [id number] Date of Birth: 06/20/1956

## 2020-10-30 NOTE — Patient Instructions (Signed)
Access Code: [email or web address] URL: [email or web address] Date: 10/30/2020 Prepared by: Vladimir Faster  Exercises Seated Hamstring Stretch with Chair - 2 x daily - 7 x weekly - 1 sets - 1 reps - 5 minutes hold Seated Hamstring Stretch with Strap - 2 x daily - 7 x weekly - 3 reps - 1 sets - 30 seconds hold Sit to Stand with Counter Support - 1 x daily - 7 x weekly - 1 sets - 10 reps - 5 seconds hold Standing hip abduction alternating legs - 1 x daily - 7 x weekly - 2-3 sets - 3 reps - 5 seconds hold Standing hip extension alternating legs - 1 x daily - 7 x weekly - 2-3 sets - 3 reps - 5 seconds hold Seated Hip Flexion Toward Target - 1 x daily - 7 x weekly - 10 reps - 1 sets - 5 seconds hold Seated Eccentric Abdominal Lean Back - 1 x daily - 7 x weekly - 10 reps - 1 sets - 5 seconds hold Seated Sidebending - 1 x daily - 7 x weekly - 10 reps - 1 sets - 5 seconds hold Seated Trunk Rotation with Crossed Arms - 1 x daily - 7 x weekly - 1 sets - 10 reps - 5 seconds hold

## 2020-11-06 ENCOUNTER — Ambulatory Visit (INDEPENDENT_AMBULATORY_CARE_PROVIDER_SITE_OTHER): Payer: Medicare Other | Admitting: Physical Therapy

## 2020-11-06 ENCOUNTER — Encounter: Payer: Self-pay | Admitting: Physical Therapy

## 2020-11-06 ENCOUNTER — Other Ambulatory Visit: Payer: Self-pay

## 2020-11-06 DIAGNOSIS — R2681 Unsteadiness on feet: Secondary | ICD-10-CM

## 2020-11-06 DIAGNOSIS — M25661 Stiffness of right knee, not elsewhere classified: Secondary | ICD-10-CM

## 2020-11-06 DIAGNOSIS — R2689 Other abnormalities of gait and mobility: Secondary | ICD-10-CM | POA: Diagnosis not present

## 2020-11-06 DIAGNOSIS — M25662 Stiffness of left knee, not elsewhere classified: Secondary | ICD-10-CM

## 2020-11-06 DIAGNOSIS — M6281 Muscle weakness (generalized): Secondary | ICD-10-CM

## 2020-11-06 DIAGNOSIS — R293 Abnormal posture: Secondary | ICD-10-CM

## 2020-11-06 NOTE — Therapy (Signed)
[facility] Physical Therapy [address] [place], [place], [address] Phone: [phone]   Fax:  [phone]  Physical Therapy Treatment & Recertification  Patient Details  Name: Bruce Deleon MRN: [id number] Date of Birth: [birth year] Referring Provider (PT): Jean Rosenthal, MD   Encounter Date: 11/06/2020   PT End of Session - 11/06/20 1522    Visit Number 12    Number of Visits 36    Date for PT Re-Evaluation 01/31/21    Authorization Type [employer] Medicare    Progress Note Due on Visit 20    PT Start Time 1519    PT Stop Time 1600    PT Time Calculation (min) 41 min    Equipment Utilized During Treatment Gait belt    Activity Tolerance Patient tolerated treatment well;Patient limited by fatigue    Behavior During Therapy [facility] for tasks assessed/performed           Past Medical History:  Diagnosis Date  . ALS (amyotrophic lateral sclerosis) ([place]) 01/25/2013  . ALS (amyotrophic lateral sclerosis) ([place])   . Chronic respiratory insufficiency 06/12/2014  . Essential hypertension 01/25/2013    Past Surgical History:  Procedure Laterality Date  . AMPUTATION Left 08/06/2018   Procedure: LEFT BELOW KNEE AMPUTATION;  Surgeon: Mcarthur Rossetti, MD;  Location: [facility];  Service: Orthopedics;  Laterality: Left;  . APPLICATION OF WOUND VAC Left 08/04/2018   Procedure: APPLICATION OF WOUND VAC;  Surgeon: Mcarthur Rossetti, MD;  Location: [facility];  Service: Orthopedics;  Laterality: Left;  . I & D EXTREMITY Left 08/01/2018   Procedure: IRRIGATION AND DEBRIDEMENT foot and ankle amputation first and second rays;  Surgeon: Mcarthur Rossetti, MD;  Location: [facility];  Service: Orthopedics;  Laterality: Left;  . I & D EXTREMITY Left 08/04/2018   Procedure: REPEAT IRRIGATION AND DEBRIDEMENT OF LEFT ANKLE;  Surgeon: Mcarthur Rossetti, MD;  Location: [facility];  Service: Orthopedics;  Laterality: Left;    There were no vitals filed for this visit.    Subjective Assessment - 11/06/20 1519    Subjective He has been doing his HEP without issues. He was able to walk with wife with RW with only supervision.    Patient is accompained by: Family member   Bruce Deleon, wife   Pertinent History sink    Patient Stated Goals to use prosthesis to get out of w/c, drive,    Currently in Pain? No/denies    Pain Onset 1 to 4 weeks ago                             [facility] Adult PT Treatment/Exercise - 11/06/20 1519      Transfers   Transfers Sit to Stand;Stand to Sit;Squat Pivot Transfers    Sit to Stand 5: Supervision;With upper extremity assist;With armrests;From chair/3-in-1;Other (comment)   to sink   Stand to Sit 5: Supervision;With upper extremity assist;With armrests;To chair/3-in-1   from sink     Ambulation/Gait   Ambulation/Gait Yes    Ambulation/Gait Assistance 5: Supervision    Ambulation Distance (Feet) 50 Feet    Assistive device Rolling walker;Prosthesis    Ambulation Surface Level;Indoor    Stairs Yes    Stairs Assistance 3: Mod assist    Stairs Assistance Details (indicate cue type and reason) PT demo & verbal cues on technique with left rail similar to his home & side stepping and how wife would need to move RW.  Stair Management Technique One rail Left;Step to pattern;Sideways    Number of Stairs 2    Height of Stairs 6    Curb 4: Min assist   RW & TTA prosthesis   Curb Details (indicate cue type and reason) PT demo & verbal cues on technique.  Initially with 4" step & progressed to 6" step.      Neuro Re-ed    Neuro Re-ed Details  --      Knee/Hip Exercises: Stretches   Active Hamstring Stretch --    Active Hamstring Stretch Limitations --      Prosthetics   Prosthetic Care Comments  pt has appt with prosthetist on 4/27 including dynamic alignment.    Current prosthetic wear tolerance (days/week)  daily    Current prosthetic wear tolerance (#hours/day)  most of awake hours except 1hr 3x    Edema  none    Residual limb condition  No open areas, prominent tibia with minimal soft tissue, dry distal skin scaly, scar invaginated & adhered at distal tibia,  cylinderical shape    Donning Prosthesis Supervision                    PT Short Term Goals - 11/06/20 1631      PT SHORT TERM GOAL #1   Title Patient demo donning prosthesis without assistance    Time 4    Period Weeks    Status Achieved    Target Date 11/15/20      PT SHORT TERM GOAL #2   Title Patient sit to / from stand chair with armrests to RW with supervision.    Time 4    Period Weeks    Status Achieved    Target Date 11/15/20      PT SHORT TERM GOAL #3   Title Patient ambulates 150' with RW & prosthesis with min guard    Baseline He has ambulated up to 126' with RW with min guard.    Time 4    Period Weeks    Status Partially Met    Target Date 11/15/20             PT Short Term Goals - 11/06/20 1642      PT SHORT TERM GOAL #1   Title Stand pivot transfer between chairs with armrests using RW & prosthesis with supervision.    Time 4    Period Weeks    Status New    Target Date 12/06/20      PT SHORT TERM GOAL #2   Title Patient negotiates curb with RW & prosthesis with minA.    Time 4    Period Weeks    Status New    Target Date 12/06/20      PT SHORT TERM GOAL #3   Title Patient ambulates 150' with RW & prosthesis with supervision.    Time 4    Period Weeks    Status Revised    Target Date 12/06/20      PT SHORT TERM GOAL #4   Title Patient negotiates 2 steps with left rail sidestepping with minA.    Time 4    Period Weeks    Status New    Target Date 12/06/20             PT Long Term Goals - 11/06/20 1632      PT LONG TERM GOAL #1   Title Patient & wife demonstrate and verbalize understanding of proper prosthetic care  to enable safe utililzation of prosthesis.    Time 3    Period Months    Status On-going    Target Date 01/31/21      PT LONG TERM GOAL #2   Title  Patient tolerates prosthesis wear >90% of awake hours without skin or limb pain issues to enable function throughout his day.    Time 3    Period Months    Status On-going    Target Date 01/31/21      PT LONG TERM GOAL #3   Title Patient able to sit to/from stand from chairs without armrests using UEs to RW and stand pivot transfers with RW modified independent.    Time 3    Period Months    Status New    Target Date 01/31/21      PT LONG TERM GOAL #4   Title Patient ambulates around furniture 29' with RW modified independent for household mobility.    Time 3    Period Months    Status New    Target Date 01/31/21      PT LONG TERM GOAL #5   Title Patient ambulates 200' with RW & prosthesis with supervision to enable community mobility.    Time 3    Period Months    Status Revised    Target Date 01/31/21      Additional Long Term Goals   Additional Long Term Goals Yes      PT LONG TERM GOAL #6   Title Patient negotiates curb / single step with RW & 2 steps with left rail with supervision to enable access to his home ambulating.    Time 3    Period Months    Status New    Target Date 01/31/21                 Plan - 11/06/20 1524    Clinical Impression Statement Patient met or partially met all STGs set for 30 day period.  He is progressing well with mobliity with his prosthesis but could progress more with additional care.  PT introduced negotiating curb & 2 steps with left rail similar to entrance at his home.  He did well for first time but will need more work to be safe performing outside of PT.    Personal Factors and Comorbidities Fitness;Time since onset of injury/illness/exacerbation;Comorbidity 2    Comorbidities ALS, HTN    Examination-Activity Limitations Lift;Locomotion Level;Stairs;Stand;Transfers    Examination-Participation Restrictions Community Activity    Stability/Clinical Decision Making Evolving/Moderate complexity    Rehab Potential Good    PT  Frequency 2x / week    PT Duration Other (comment)   6 months   PT Treatment/Interventions ADLs/Self Care Home Management;DME Instruction;Gait training;Stair training;Functional mobility training;Therapeutic activities;Therapeutic exercise;Balance training;Neuromuscular re-education;Patient/family education;Prosthetic Training;Manual techniques;Scar mobilization;Passive range of motion    PT Next Visit Plan review prosthetic care, work on standing balance & prosthetic gait including curb & 2 steps with left rail. therapuetic exercise for strength, flexibility & balance    Consulted and Agree with Plan of Care Patient;Family member/caregiver    Family Member Consulted wife, Bruce Deleon           Patient will benefit from skilled therapeutic intervention in order to improve the following deficits and impairments:  Abnormal gait,Cardiopulmonary status limiting activity,Decreased activity tolerance,Decreased balance,Decreased endurance,Decreased knowledge of use of DME,Decreased mobility,Decreased range of motion,Decreased skin integrity,Decreased scar mobility,Decreased strength,Difficulty walking,Impaired flexibility,Impaired UE functional use,Postural dysfunction,Prosthetic Dependency  Visit Diagnosis: Muscle  weakness (generalized)  Unsteadiness on feet  Other abnormalities of gait and mobility  Stiffness of right knee, not elsewhere classified  Stiffness of left knee, not elsewhere classified  Abnormal posture     Problem List Patient Active Problem List   Diagnosis Date Noted  . History of left below knee amputation ([place]) 06/04/2020  . Phantom pain after amputation of lower extremity ([place]) 04/13/2019  . Moderate protein-calorie malnutrition ([place]) 04/13/2019  . Leg wound, left, subsequent encounter 10/20/2018  . S/P BKA (below knee amputation) unilateral, left ([place]) 08/26/2018  . Osteomyelitis ([place]) 08/01/2018  . Left foot and ankle infection 08/01/2018  . Cellulitis of left  foot   . Chronic respiratory insufficiency 06/12/2014  . ALS (amyotrophic lateral sclerosis) ([place]) 01/25/2013  . Essential hypertension 01/25/2013    Jamey Reas, PT, DPT 11/06/2020, 4:40 PM  [facility] Physical Therapy [address] [place], [place], [address] Phone: [phone]   Fax:  [phone]  Name: Bruce Deleon MRN: [id number] Date of Birth: June 06, 1956

## 2020-11-13 ENCOUNTER — Ambulatory Visit (INDEPENDENT_AMBULATORY_CARE_PROVIDER_SITE_OTHER): Payer: Medicare Other | Admitting: Physical Therapy

## 2020-11-13 ENCOUNTER — Other Ambulatory Visit: Payer: Self-pay

## 2020-11-13 DIAGNOSIS — M6281 Muscle weakness (generalized): Secondary | ICD-10-CM

## 2020-11-13 DIAGNOSIS — M25661 Stiffness of right knee, not elsewhere classified: Secondary | ICD-10-CM

## 2020-11-13 DIAGNOSIS — R2681 Unsteadiness on feet: Secondary | ICD-10-CM

## 2020-11-13 DIAGNOSIS — R2689 Other abnormalities of gait and mobility: Secondary | ICD-10-CM | POA: Diagnosis not present

## 2020-11-13 DIAGNOSIS — M25662 Stiffness of left knee, not elsewhere classified: Secondary | ICD-10-CM

## 2020-11-13 NOTE — Therapy (Signed)
[facility] [address] [place], [place], [address] Phone: [phone]   Fax:  [phone]  Physical Therapy Treatment  Patient Details  Name: Bruce Deleon MRN: [id number] Date of Birth: April 10, 1956 Referring Provider (PT): Doneen Poisson, MD   Encounter Date: 11/13/2020   PT End of Session - 11/13/20 1545    Visit Number 13    Number of Visits 36    Date for PT Re-Evaluation 01/31/21    Authorization Type [employer] Medicare    Progress Note Due on Visit 20    PT Start Time 1510    PT Stop Time 1550    PT Time Calculation (min) 40 min    Equipment Utilized During Treatment Gait belt    Activity Tolerance Patient tolerated treatment well;Patient limited by fatigue    Behavior During Therapy [facility] for tasks assessed/performed           Past Medical History:  Diagnosis Date  . 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 ALS (amyotrophic lateral sclerosis) (HCC)   . Chronic respiratory insufficiency 06/12/2014  . Essential hypertension 01/25/2013    Past Surgical History:  Procedure Laterality Date  . AMPUTATION Left 08/06/2018   Procedure: LEFT BELOW KNEE AMPUTATION;  Surgeon: Kathryne Hitch, MD;  Location: [facility];  Service: Orthopedics;  Laterality: Left;  . APPLICATION OF WOUND VAC Left 08/04/2018   Procedure: APPLICATION OF WOUND VAC;  Surgeon: Kathryne Hitch, MD;  Location: [facility];  Service: Orthopedics;  Laterality: Left;  . I & D EXTREMITY Left 08/01/2018   Procedure: IRRIGATION AND DEBRIDEMENT foot and ankle amputation first and second rays;  Surgeon: Kathryne Hitch, MD;  Location: [facility];  Service: Orthopedics;  Laterality: Left;  . I & D EXTREMITY Left 08/04/2018   Procedure: REPEAT IRRIGATION AND DEBRIDEMENT OF LEFT ANKLE;  Surgeon: Kathryne Hitch, MD;  Location: [facility];  Service: Orthopedics;  Laterality: Left;    There were no vitals filed for this visit.   Subjective Assessment  - 11/13/20 1530    Subjective He denies pain in his residual limb, he does have some pain on sound side knee (his Rt) from overall increased activity    Patient is accompained by: Family member   Renee Rybicki, wife   Pertinent History sink    Patient Stated Goals to use prosthesis to get out of w/c, drive,    Pain Onset 1 to 4 weeks ago           [facility] Adult PT Treatment/Exercise - 11/13/20 0001      Transfers   Transfers Sit to Stand;Stand to Sit;Squat Pivot Transfers    Sit to Stand 5: Supervision;With upper extremity assist;With armrests;From chair/3-in-1;Other (comment)   with RW   Stand to Sit 5: Supervision;With upper extremity assist;With armrests;To chair/3-in-1      Ambulation/Gait   Ambulation/Gait Yes    Ambulation/Gait Assistance 5: Supervision    Ambulation Distance (Feet) 100 Feet    Assistive device Rolling walker;Prosthesis    Ambulation Surface Level;Indoor    Curb 4: Min assist   for balance and RW management   Curb Details (indicate cue type and reason) demo, verbal & manual cues on technique.      Neuro Re-ed    Neuro Re-ed Details  in bars balance normal BOS no UE support 30 seconds, then head turns X10 reps bilat, then head nods X10, CGA with this      Knee/Hip Exercises: Stretches   Active Hamstring  Stretch Left;3 reps;30 seconds    Active Hamstring Stretch Limitations seated at edge of chair with leg extended using strap      Knee/Hip Exercises: Aerobic   Nustep Seat 12  level 5 for 8 min      Knee/Hip Exercises: Standing   Other Standing Knee Exercises hip 4 way with bilat UE support X10 reps ea red band                    PT Short Term Goals - 11/06/20 1642      PT SHORT TERM GOAL #1   Title Stand pivot transfer between chairs with armrests using RW & prosthesis with supervision.    Time 4    Period Weeks    Status New    Target Date 12/06/20      PT SHORT TERM GOAL #2   Title Patient negotiates curb with RW & prosthesis with  minA.    Time 4    Period Weeks    Status New    Target Date 12/06/20      PT SHORT TERM GOAL #3   Title Patient ambulates 150' with RW & prosthesis with supervision.    Time 4    Period Weeks    Status Revised    Target Date 12/06/20      PT SHORT TERM GOAL #4   Title Patient negotiates 2 steps with left rail sidestepping with minA.    Time 4    Period Weeks    Status New    Target Date 12/06/20             PT Long Term Goals - 11/06/20 1632      PT LONG TERM GOAL #1   Title Patient & wife demonstrate and verbalize understanding of proper prosthetic care to enable safe utililzation of prosthesis.    Time 3    Period Months    Status On-going    Target Date 01/31/21      PT LONG TERM GOAL #2   Title Patient tolerates prosthesis wear >90% of awake hours without skin or limb pain issues to enable function throughout his day.    Time 3    Period Months    Status On-going    Target Date 01/31/21      PT LONG TERM GOAL #3   Title Patient able to sit to/from stand from chairs without armrests using UEs to RW and stand pivot transfers with RW modified independent.    Time 3    Period Months    Status New    Target Date 01/31/21      PT LONG TERM GOAL #4   Title Patient ambulates around furniture 41' with RW modified independent for household mobility.    Time 3    Period Months    Status New    Target Date 01/31/21      PT LONG TERM GOAL #5   Title Patient ambulates 200' with RW & prosthesis with supervision to enable community mobility.    Time 3    Period Months    Status Revised    Target Date 01/31/21      Additional Long Term Goals   Additional Long Term Goals Yes      PT LONG TERM GOAL #6   Title Patient negotiates curb / single step with RW & 2 steps with left rail with supervision to enable access to his home ambulating.    Time 3  Period Months    Status New    Target Date 01/31/21                 Plan - 11/13/20 1546    Clinical  Impression Statement Session focused on gait, standing tolerance, balance, hip/knee strength and overall endurance today to his tolerance without any issues with the prosthesis. Continue POC    Personal Factors and Comorbidities Fitness;Time since onset of injury/illness/exacerbation;Comorbidity 2    Comorbidities ALS, HTN    Examination-Activity Limitations Lift;Locomotion Level;Stairs;Stand;Transfers    Examination-Participation Restrictions Community Activity    Stability/Clinical Decision Making Evolving/Moderate complexity    Rehab Potential Good    PT Frequency 2x / week    PT Duration Other (comment)   6 months   PT Treatment/Interventions ADLs/Self Care Home Management;DME Instruction;Gait training;Stair training;Functional mobility training;Therapeutic activities;Therapeutic exercise;Balance training;Neuromuscular re-education;Patient/family education;Prosthetic Training;Manual techniques;Scar mobilization;Passive range of motion    PT Next Visit Plan review prosthetic care, work on standing balance & prosthetic gait including curb & 2 steps with left rail. therapuetic exercise for strength, flexibility & balance    Consulted and Agree with Plan of Care Patient;Family member/caregiver    Family Member Consulted wife, Farah Benish           Patient will benefit from skilled therapeutic intervention in order to improve the following deficits and impairments:  Abnormal gait,Cardiopulmonary status limiting activity,Decreased activity tolerance,Decreased balance,Decreased endurance,Decreased knowledge of use of DME,Decreased mobility,Decreased range of motion,Decreased skin integrity,Decreased scar mobility,Decreased strength,Difficulty walking,Impaired flexibility,Impaired UE functional use,Postural dysfunction,Prosthetic Dependency  Visit Diagnosis: Muscle weakness (generalized)  Unsteadiness on feet  Other abnormalities of gait and mobility  Stiffness of right knee, not elsewhere  classified  Stiffness of left knee, not elsewhere classified     Problem List Patient Active Problem List   Diagnosis Date Noted  . History of left below knee amputation (HCC) 06/04/2020  . 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 Moderate protein-calorie malnutrition (HCC) 04/13/2019  . Leg wound, left, subsequent encounter 10/20/2018  . S/P BKA (below knee amputation) unilateral, left (HCC) 08/26/2018  . Osteomyelitis (HCC) 08/01/2018  . Left foot and ankle infection 08/01/2018  . Cellulitis of left foot   . Chronic respiratory insufficiency 06/12/2014  . 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 Essential hypertension 01/25/2013    Birdie Riddle 11/13/2020, 3:47 PM  [facility] [address] [place], [place], [address] Phone: [phone]   Fax:  [phone]  Name: DEIVI HUCKINS MRN: [id number] Date of Birth: 05-27-1956

## 2020-11-14 ENCOUNTER — Encounter: Payer: Medicare Other | Admitting: Physical Therapy

## 2020-11-20 ENCOUNTER — Encounter: Payer: Medicare Other | Admitting: Physical Therapy

## 2020-11-21 ENCOUNTER — Encounter: Payer: Medicare Other | Admitting: Physical Therapy

## 2020-11-27 ENCOUNTER — Ambulatory Visit (INDEPENDENT_AMBULATORY_CARE_PROVIDER_SITE_OTHER): Payer: Medicare Other | Admitting: Physical Therapy

## 2020-11-27 ENCOUNTER — Encounter: Payer: Self-pay | Admitting: Physical Therapy

## 2020-11-27 ENCOUNTER — Other Ambulatory Visit: Payer: Self-pay

## 2020-11-27 DIAGNOSIS — M25661 Stiffness of right knee, not elsewhere classified: Secondary | ICD-10-CM

## 2020-11-27 DIAGNOSIS — R2681 Unsteadiness on feet: Secondary | ICD-10-CM | POA: Diagnosis not present

## 2020-11-27 DIAGNOSIS — R293 Abnormal posture: Secondary | ICD-10-CM

## 2020-11-27 DIAGNOSIS — R2689 Other abnormalities of gait and mobility: Secondary | ICD-10-CM | POA: Diagnosis not present

## 2020-11-27 DIAGNOSIS — M6281 Muscle weakness (generalized): Secondary | ICD-10-CM | POA: Diagnosis not present

## 2020-11-27 DIAGNOSIS — M25662 Stiffness of left knee, not elsewhere classified: Secondary | ICD-10-CM

## 2020-11-27 NOTE — Therapy (Signed)
[facility] [address] [place], [place], [address] Phone: [phone]   Fax:  [phone]  Physical Therapy Treatment  Patient Details  Name: Bruce Deleon MRN: [id number] Date of Birth: 09-22-1955 Referring Provider (PT): Doneen Poisson, MD   Encounter Date: 11/27/2020   PT End of Session - 11/27/20 1524    Visit Number 14    Number of Visits 36    Date for PT Re-Evaluation 01/31/21    Authorization Type [employer] Medicare    Progress Note Due on Visit 20    PT Start Time 1515    PT Stop Time 1558    PT Time Calculation (min) 43 min    Equipment Utilized During Treatment Gait belt    Activity Tolerance Patient tolerated treatment well;Patient limited by fatigue    Behavior During Therapy [facility] for tasks assessed/performed           Past Medical History:  Diagnosis Date  . 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 ALS (amyotrophic lateral sclerosis) (HCC)   . Chronic respiratory insufficiency 06/12/2014  . Essential hypertension 01/25/2013    Past Surgical History:  Procedure Laterality Date  . AMPUTATION Left 08/06/2018   Procedure: LEFT BELOW KNEE AMPUTATION;  Surgeon: Kathryne Hitch, MD;  Location: [facility];  Service: Orthopedics;  Laterality: Left;  . APPLICATION OF WOUND VAC Left 08/04/2018   Procedure: APPLICATION OF WOUND VAC;  Surgeon: Kathryne Hitch, MD;  Location: [facility];  Service: Orthopedics;  Laterality: Left;  . I & D EXTREMITY Left 08/01/2018   Procedure: IRRIGATION AND DEBRIDEMENT foot and ankle amputation first and second rays;  Surgeon: Kathryne Hitch, MD;  Location: [facility];  Service: Orthopedics;  Laterality: Left;  . I & D EXTREMITY Left 08/04/2018   Procedure: REPEAT IRRIGATION AND DEBRIDEMENT OF LEFT ANKLE;  Surgeon: Kathryne Hitch, MD;  Location: [facility];  Service: Orthopedics;  Laterality: Left;    There were no vitals filed for this visit.   Subjective Assessment  - 11/27/20 1515    Subjective He has been feeling well.  He has been wearing prosthesis daily most of awake hours.    Patient is accompained by: Family member   Bruce Deleon, wife   Pertinent History sink    Patient Stated Goals to use prosthesis to get out of w/c, drive,    Currently in Pain? No/denies    Pain Onset 1 to 4 weeks ago                             [facility] Adult PT Treatment/Exercise - 11/27/20 1515      Transfers   Transfers Sit to Stand;Stand to Sit;Squat Pivot Transfers    Sit to Stand 5: Supervision;With upper extremity assist;From chair/3-in-1;Other (comment)   with RW   Sit to Stand Details Visual cues for safe use of DME/AE;Verbal cues for technique    Sit to Stand Details (indicate cue type and reason) worked on Production designer, theatre/television/film without armrests with UEs    Stand to Sit 5: Supervision;With upper extremity assist;To chair/3-in-1   with RW   Stand to Sit Details (indicate cue type and reason) Visual cues for safe use of DME/AE;Verbal cues for technique    Stand to Sit Details worked on Production designer, theatre/television/film without armrests with UEs      Ambulation/Gait   Ambulation/Gait Yes    Ambulation/Gait Assistance 5: Supervision    Ambulation/Gait Assistance Details  verbal cues for upright posture.  Fatigued easier today probably due to recent illness    Ambulation Distance (Feet) 40 Feet   40' X 2   Assistive device Rolling walker;Prosthesis    Ramp 4: Min assist   RW & TTA  prosthesis   Ramp Details (indicate cue type and reason) verbal cues on technique    Curb 4: Min assist   RW & TTA  prosthesis   Curb Details (indicate cue type and reason) tactile & verbal cues on technique      Knee/Hip Exercises: Stretches   Active Hamstring Stretch Left;Right;2 reps;30 seconds   AAROM   Active Hamstring Stretch Limitations supine SLR witih strap      Knee/Hip Exercises: Aerobic   Nustep Seat 12  level 7 for 8 min      Knee/Hip Exercises: Machines for Strengthening    Cybex Leg Press shuttle leg press BLEs 50# 10 reps 2 sets 1st set back 45* & 2nd set back flat      Prosthetics   Current prosthetic wear tolerance (days/week)  daily    Current prosthetic wear tolerance (#hours/day)  most of awake hours even when not feeling well    Residual limb condition  reports no issues.                    PT Short Term Goals - 11/06/20 1642      PT SHORT TERM GOAL #1   Title Stand pivot transfer between chairs with armrests using RW & prosthesis with supervision.    Time 4    Period Weeks    Status New    Target Date 12/06/20      PT SHORT TERM GOAL #2   Title Patient negotiates curb with RW & prosthesis with minA.    Time 4    Period Weeks    Status New    Target Date 12/06/20      PT SHORT TERM GOAL #3   Title Patient ambulates 150' with RW & prosthesis with supervision.    Time 4    Period Weeks    Status Revised    Target Date 12/06/20      PT SHORT TERM GOAL #4   Title Patient negotiates 2 steps with left rail sidestepping with minA.    Time 4    Period Weeks    Status New    Target Date 12/06/20             PT Long Term Goals - 11/06/20 1632      PT LONG TERM GOAL #1   Title Patient & wife demonstrate and verbalize understanding of proper prosthetic care to enable safe utililzation of prosthesis.    Time 3    Period Months    Status On-going    Target Date 01/31/21      PT LONG TERM GOAL #2   Title Patient tolerates prosthesis wear >90% of awake hours without skin or limb pain issues to enable function throughout his day.    Time 3    Period Months    Status On-going    Target Date 01/31/21      PT LONG TERM GOAL #3   Title Patient able to sit to/from stand from chairs without armrests using UEs to RW and stand pivot transfers with RW modified independent.    Time 3    Period Months    Status New    Target Date 01/31/21  PT LONG TERM GOAL #4   Title Patient ambulates around furniture 1475' with RW  modified independent for household mobility.    Time 3    Period Months    Status New    Target Date 01/31/21      PT LONG TERM GOAL #5   Title Patient ambulates 200' with RW & prosthesis with supervision to enable community mobility.    Time 3    Period Months    Status Revised    Target Date 01/31/21      Additional Long Term Goals   Additional Long Term Goals Yes      PT LONG TERM GOAL #6   Title Patient negotiates curb / single step with RW & 2 steps with left rail with supervision to enable access to his home ambulating.    Time 3    Period Months    Status New    Target Date 01/31/21                 Plan - 11/27/20 1524    Clinical Impression Statement Patient fatigued quicker today probably due to recent illness.  PT recommended letting his PCP know when he does not feel well to limit complications for something that could be controlled easier if diagnosed sooner.  Pt & wife verbalized understanding. He improved on ability to negotiate ramps & curbs.    Personal Factors and Comorbidities Fitness;Time since onset of injury/illness/exacerbation;Comorbidity 2    Comorbidities ALS, HTN    Examination-Activity Limitations Lift;Locomotion Level;Stairs;Stand;Transfers    Examination-Participation Restrictions Community Activity    Stability/Clinical Decision Making Evolving/Moderate complexity    Rehab Potential Good    PT Frequency 2x / week    PT Duration Other (comment)   6 months   PT Treatment/Interventions ADLs/Self Care Home Management;DME Instruction;Gait training;Stair training;Functional mobility training;Therapeutic activities;Therapeutic exercise;Balance training;Neuromuscular re-education;Patient/family education;Prosthetic Training;Manual techniques;Scar mobilization;Passive range of motion    PT Next Visit Plan review prosthetic care, work on standing balance & prosthetic gait including curb & 2 steps with left rail. therapuetic exercise for strength,  flexibility & balance    Consulted and Agree with Plan of Care Patient;Family member/caregiver    Family Member Consulted wife, Bruce Deleon           Patient will benefit from skilled therapeutic intervention in order to improve the following deficits and impairments:  Abnormal gait,Cardiopulmonary status limiting activity,Decreased activity tolerance,Decreased balance,Decreased endurance,Decreased knowledge of use of DME,Decreased mobility,Decreased range of motion,Decreased skin integrity,Decreased scar mobility,Decreased strength,Difficulty walking,Impaired flexibility,Impaired UE functional use,Postural dysfunction,Prosthetic Dependency  Visit Diagnosis: Muscle weakness (generalized)  Unsteadiness on feet  Other abnormalities of gait and mobility  Stiffness of right knee, not elsewhere classified  Stiffness of left knee, not elsewhere classified  Abnormal posture     Problem List Patient Active Problem List   Diagnosis Date Noted  . History of left below knee amputation (HCC) 06/04/2020  . 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 Moderate protein-calorie malnutrition (HCC) 04/13/2019  . Leg wound, left, subsequent encounter 10/20/2018  . S/P BKA (below knee amputation) unilateral, left (HCC) 08/26/2018  . Osteomyelitis (HCC) 08/01/2018  . Left foot and ankle infection 08/01/2018  . Cellulitis of left foot   . Chronic respiratory insufficiency 06/12/2014  . 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 Essential hypertension 01/25/2013    Vladimir Fasterobin Sharma Lawrance, PT, DPT 11/27/2020, 4:05 PM  [facility] [address] [place], [place], [address] Phone: [phone]  Fax:  [phone]  Name: Bruce Deleon MRN: [id number] Date of Birth: [birth year]

## 2020-11-28 ENCOUNTER — Encounter: Payer: Medicare Other | Admitting: Physical Therapy

## 2020-12-04 ENCOUNTER — Encounter: Payer: Medicare Other | Admitting: Physical Therapy

## 2020-12-05 ENCOUNTER — Encounter: Payer: Medicare Other | Admitting: Physical Therapy

## 2020-12-11 ENCOUNTER — Other Ambulatory Visit: Payer: Self-pay

## 2020-12-11 ENCOUNTER — Encounter: Payer: Self-pay | Admitting: Physical Therapy

## 2020-12-11 ENCOUNTER — Ambulatory Visit (INDEPENDENT_AMBULATORY_CARE_PROVIDER_SITE_OTHER): Payer: Medicare Other | Admitting: Physical Therapy

## 2020-12-11 DIAGNOSIS — R293 Abnormal posture: Secondary | ICD-10-CM

## 2020-12-11 DIAGNOSIS — R2689 Other abnormalities of gait and mobility: Secondary | ICD-10-CM

## 2020-12-11 DIAGNOSIS — R2681 Unsteadiness on feet: Secondary | ICD-10-CM

## 2020-12-11 DIAGNOSIS — M25661 Stiffness of right knee, not elsewhere classified: Secondary | ICD-10-CM

## 2020-12-11 DIAGNOSIS — M6281 Muscle weakness (generalized): Secondary | ICD-10-CM | POA: Diagnosis not present

## 2020-12-11 DIAGNOSIS — M25662 Stiffness of left knee, not elsewhere classified: Secondary | ICD-10-CM

## 2020-12-11 NOTE — Therapy (Signed)
[facility] [address] [place], [place], [address] Phone: [phone]   Fax:  [phone]  Physical Therapy Treatment  Patient Details  Name: Bruce Deleon MRN: [id number] Date of Birth: February 19, 1956 Referring Provider (PT): Jean Rosenthal, MD   Encounter Date: 12/11/2020   PT End of Session - 12/11/20 1529    Visit Number 15    Number of Visits 36    Date for PT Re-Evaluation 01/31/21    Authorization Type [employer] Medicare    Progress Note Due on Visit 20    PT Start Time 1523    PT Stop Time 1603    PT Time Calculation (min) 40 min    Equipment Utilized During Treatment Gait belt    Activity Tolerance Patient tolerated treatment well;Patient limited by fatigue    Behavior During Therapy [facility] for tasks assessed/performed           Past Medical History:  Diagnosis Date  . ALS (amyotrophic lateral sclerosis) ([place]) 01/25/2013  . ALS (amyotrophic lateral sclerosis) ([place])   . Chronic respiratory insufficiency 06/12/2014  . Essential hypertension 01/25/2013    Past Surgical History:  Procedure Laterality Date  . AMPUTATION Left 08/06/2018   Procedure: LEFT BELOW KNEE AMPUTATION;  Surgeon: Mcarthur Rossetti, MD;  Location: [facility];  Service: Orthopedics;  Laterality: Left;  . APPLICATION OF WOUND VAC Left 08/04/2018   Procedure: APPLICATION OF WOUND VAC;  Surgeon: Mcarthur Rossetti, MD;  Location: [facility];  Service: Orthopedics;  Laterality: Left;  . I & D EXTREMITY Left 08/01/2018   Procedure: IRRIGATION AND DEBRIDEMENT foot and ankle amputation first and second rays;  Surgeon: Mcarthur Rossetti, MD;  Location: [facility];  Service: Orthopedics;  Laterality: Left;  . I & D EXTREMITY Left 08/04/2018   Procedure: REPEAT IRRIGATION AND DEBRIDEMENT OF LEFT ANKLE;  Surgeon: Mcarthur Rossetti, MD;  Location: [facility];  Service: Orthopedics;  Laterality: Left;    There were no vitals filed for this visit.   Subjective Assessment  - 12/11/20 1523    Subjective He fell on 11/30/2020 backing up to close fridge door. His right hip & knee were so sore & swollen that he spent 4 days in bed. He was tested for Covid but was negative.    Patient is accompained by: Family member   Renee Schrack, wife   Pertinent History sink    Patient Stated Goals to use prosthesis to get out of w/c, drive,    Currently in Pain? Yes    Pain Score 2     Pain Location Knee    Pain Orientation Right;Anterior;Lateral    Pain Descriptors / Indicators Aching    Pain Type Acute pain    Pain Onset 1 to 4 weeks ago    Pain Frequency Intermittent    Aggravating Factors  from fall, nothing seems to increase it now    Pain Relieving Factors takes tylenol some                             [facility] Adult PT Treatment/Exercise - 12/11/20 1523      Transfers   Transfers Sit to Stand;Stand to Sit;Squat Pivot Transfers    Sit to Stand 5: Supervision;With upper extremity assist;From chair/3-in-1;Other (comment)   with RW   Sit to Stand Details Visual cues for safe use of DME/AE;Verbal cues for technique    Stand to Sit 5: Supervision;With upper extremity assist;To chair/3-in-1  with RW   Stand to Sit Details (indicate cue type and reason) Visual cues for safe use of DME/AE;Verbal cues for technique      Ambulation/Gait   Ambulation/Gait Yes    Ambulation/Gait Assistance 5: Supervision    Ambulation Distance (Feet) 40 Feet    Assistive device Rolling walker;Prosthesis    Ambulation Surface Level;Indoor      High Level Balance   High Level Balance Activities Side stepping;Backward walking;Other (comment)   RW & TTA prosthesis   High Level Balance Comments verbal, demo & tactile cues on technique & wt shift.      Self-Care   ADL's use of walker accessories like walker basket, cup holder or walker bag to transport items from one point to another in home.  PT used internet to introduce items noted.  Pt & wife verbalize understanding.     Other Self-Care Comments  PT also recommended keeping cell phone on his person at all time so he can call for assistance if needed like recent fall.  PT also recommended putting combo hide-a-key outside home so can give EMS access to home if he is unable to answer the door when wife is not home.  Both verbalized understanding.      Neuro Re-ed    Neuro Re-ed Details  standing with RW support - 5 reps ea UE & ea exercise red theraband - rows, forward punch, adduction from lateral to across body, upper reach - tactile & verbal cues for equal LE weight bearing      Knee/Hip Exercises: Aerobic   Nustep Seat 12  level 7 for 5 min      Prosthetics   Current prosthetic wear tolerance (days/week)  daily except 4 days in bed after fall    Current prosthetic wear tolerance (#hours/day)  most of awake hours even when not feeling well    Residual limb condition  reports no issues.                    PT Short Term Goals - 12/11/20 1626      PT SHORT TERM GOAL #1   Title Stand pivot transfer between chairs with armrests using RW & prosthesis with supervision.    Baseline MET 12/11/2020    Time 4    Period Weeks    Status Achieved    Target Date 12/06/20      PT SHORT TERM GOAL #2   Title Patient negotiates curb with RW & prosthesis with minA.    Time 4    Period Weeks    Status On-going    Target Date 12/06/20      PT SHORT TERM GOAL #3   Title Patient ambulates 150' with RW & prosthesis with supervision.    Time 4    Period Weeks    Status On-going    Target Date 12/06/20      PT SHORT TERM GOAL #4   Title Patient negotiates 2 steps with left rail sidestepping with minA.    Time 4    Period Weeks    Status On-going    Target Date 12/06/20             PT Short Term Goals - 12/11/20 1630      PT SHORT TERM GOAL #1   Title Patient able to reach 7" using RW & prosthesis with supervision.    Time 4    Period Weeks    Status New    Target  Date 01/03/21      PT SHORT  TERM GOAL #2   Title Patient negotiates curb with RW & prosthesis with minA.    Time 4    Period Weeks    Status On-going    Target Date 01/03/21      PT SHORT TERM GOAL #3   Title Patient ambulates 150' with RW & prosthesis with supervision.    Time 4    Period Weeks    Status On-going    Target Date 01/03/21      PT SHORT TERM GOAL #4   Title Patient negotiates 2 steps with left rail sidestepping with minA.    Time 4    Period Weeks    Status On-going    Target Date 01/03/21             PT Long Term Goals - 11/06/20 1632      PT LONG TERM GOAL #1   Title Patient & wife demonstrate and verbalize understanding of proper prosthetic care to enable safe utililzation of prosthesis.    Time 3    Period Months    Status On-going    Target Date 01/31/21      PT LONG TERM GOAL #2   Title Patient tolerates prosthesis wear >90% of awake hours without skin or limb pain issues to enable function throughout his day.    Time 3    Period Months    Status On-going    Target Date 01/31/21      PT LONG TERM GOAL #3   Title Patient able to sit to/from stand from chairs without armrests using UEs to RW and stand pivot transfers with RW modified independent.    Time 3    Period Months    Status New    Target Date 01/31/21      PT LONG TERM GOAL #4   Title Patient ambulates around furniture 55' with RW modified independent for household mobility.    Time 3    Period Months    Status New    Target Date 01/31/21      PT LONG TERM GOAL #5   Title Patient ambulates 200' with RW & prosthesis with supervision to enable community mobility.    Time 3    Period Months    Status Revised    Target Date 01/31/21      Additional Long Term Goals   Additional Long Term Goals Yes      PT LONG TERM GOAL #6   Title Patient negotiates curb / single step with RW & 2 steps with left rail with supervision to enable access to his home ambulating.    Time 3    Period Months    Status New     Target Date 01/31/21                 Plan - 12/11/20 1529    Clinical Impression Statement Patient fell at home using RW in kitchen when he was backing up to close fridge door.  PT worked on mobility with RW sidestepping & back stepping and standing balance UE resistive exercises.  He improved with PT instruction.  Pt did not meet STGs set for these 30 days due to illness & fall.    Personal Factors and Comorbidities Fitness;Time since onset of injury/illness/exacerbation;Comorbidity 2    Comorbidities ALS, HTN    Examination-Activity Limitations Lift;Locomotion Level;Stairs;Stand;Transfers    Examination-Participation Restrictions Community Activity    Stability/Clinical Decision  Making Evolving/Moderate complexity    Rehab Potential Good    PT Frequency 2x / week    PT Duration Other (comment)   6 months   PT Treatment/Interventions ADLs/Self Care Home Management;DME Instruction;Gait training;Stair training;Functional mobility training;Therapeutic activities;Therapeutic exercise;Balance training;Neuromuscular re-education;Patient/family education;Prosthetic Training;Manual techniques;Scar mobilization;Passive range of motion    PT Next Visit Plan work towards updated STGs, review prosthetic care, work on standing balance & prosthetic gait including curb & 2 steps with left rail. therapuetic exercise for strength, flexibility & balance    Consulted and Agree with Plan of Care Patient;Family member/caregiver    Family Member Consulted wife, Johanna Stafford           Patient will benefit from skilled therapeutic intervention in order to improve the following deficits and impairments:  Abnormal gait,Cardiopulmonary status limiting activity,Decreased activity tolerance,Decreased balance,Decreased endurance,Decreased knowledge of use of DME,Decreased mobility,Decreased range of motion,Decreased skin integrity,Decreased scar mobility,Decreased strength,Difficulty walking,Impaired  flexibility,Impaired UE functional use,Postural dysfunction,Prosthetic Dependency  Visit Diagnosis: Muscle weakness (generalized)  Unsteadiness on feet  Other abnormalities of gait and mobility  Stiffness of right knee, not elsewhere classified  Stiffness of left knee, not elsewhere classified  Abnormal posture     Problem List Patient Active Problem List   Diagnosis Date Noted  . History of left below knee amputation ([place]) 06/04/2020  . 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 Moderate protein-calorie malnutrition ([place]) 04/13/2019  . Leg wound, left, subsequent encounter 10/20/2018  . S/P BKA (below knee amputation) unilateral, left ([place]) 08/26/2018  . Osteomyelitis ([place]) 08/01/2018  . Left foot and ankle infection 08/01/2018  . Cellulitis of left foot   . Chronic respiratory insufficiency 06/12/2014  . ALS (amyotrophic lateral sclerosis) ([place]) 01/25/2013  . Essential hypertension 01/25/2013    Jamey Reas, PT, DPT 12/11/2020, 4:29 PM  [facility] Physical Therapy [address] [place], [place], [address] Phone: [phone]   Fax:  [phone]  Name: Bruce Deleon MRN: [id number] Date of Birth: November 08, 1955

## 2020-12-12 ENCOUNTER — Encounter: Payer: Medicare Other | Admitting: Physical Therapy

## 2020-12-18 ENCOUNTER — Other Ambulatory Visit: Payer: Self-pay

## 2020-12-18 ENCOUNTER — Encounter: Payer: Self-pay | Admitting: Physical Therapy

## 2020-12-18 ENCOUNTER — Ambulatory Visit (INDEPENDENT_AMBULATORY_CARE_PROVIDER_SITE_OTHER): Payer: Medicare Other | Admitting: Physical Therapy

## 2020-12-18 DIAGNOSIS — M6281 Muscle weakness (generalized): Secondary | ICD-10-CM

## 2020-12-18 DIAGNOSIS — M25661 Stiffness of right knee, not elsewhere classified: Secondary | ICD-10-CM

## 2020-12-18 DIAGNOSIS — R2681 Unsteadiness on feet: Secondary | ICD-10-CM | POA: Diagnosis not present

## 2020-12-18 DIAGNOSIS — M25662 Stiffness of left knee, not elsewhere classified: Secondary | ICD-10-CM

## 2020-12-18 DIAGNOSIS — R293 Abnormal posture: Secondary | ICD-10-CM

## 2020-12-18 DIAGNOSIS — R2689 Other abnormalities of gait and mobility: Secondary | ICD-10-CM | POA: Diagnosis not present

## 2020-12-18 NOTE — Therapy (Signed)
[facility] Therapy [address] [place], [place], [address] Phone: [phone]   Fax:  [phone]  Physical Therapy Treatment  Patient Details  Name: Bruce Deleon MRN: [id number] Date of Birth: May 13, 1956 Referring Provider (PT): Doneen Poisson, MD   Encounter Date: 12/18/2020   PT End of Session - 12/18/20 1524    Visit Number 16    Number of Visits 36    Date for PT Re-Evaluation 01/31/21    Authorization Type [employer]HC Medicare    Progress Note Due on Visit 20    PT Start Time 1517    PT Stop Time 1558    PT Time Calculation (min) 41 min    Equipment Utilized During Treatment Gait belt    Activity Tolerance Patient tolerated treatment well;Patient limited by fatigue    Behavior During Therapy [facility] for tasks assessed/performed           Past Medical History:  Diagnosis Date  . 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 ALS (amyotrophic lateral sclerosis) ([facility])   . Chronic respiratory insufficiency 06/12/2014  . Essential hypertension 01/25/2013    Past Surgical History:  Procedure Laterality Date  . AMPUTATION Left 08/06/2018   Procedure: LEFT BELOW KNEE AMPUTATION;  Surgeon: Kathryne Hitch, MD;  [facility]: [facility];  Service: Orthopedics;  Laterality: Left;  . APPLICATION OF WOUND VAC Left 08/04/2018   Procedure: APPLICATION OF WOUND VAC;  Surgeon: Kathryne Hitch, MD;  Location: [facility];  Service: Orthopedics;  Laterality: Left;  . I & D EXTREMITY Left 08/01/2018   Procedure: IRRIGATION AND DEBRIDEMENT foot and ankle amputation first and second rays;  Surgeon: Kathryne Hitch, MD;  Location: [facility];  Service: Orthopedics;  Laterality: Left;  . I & D EXTREMITY Left 08/04/2018   Procedure: REPEAT IRRIGATION AND DEBRIDEMENT OF LEFT ANKLE;  Surgeon: Kathryne Hitch, MD;  Location: [facility];  Service: Orthopedics;  Laterality: Left;    There were no vitals filed for this visit.   Subjective Assessment  - 12/18/20 1517    Subjective He is having difficulty with intestinal issues. He is wearing prosthesis most of awake hours without issues.  No falls.    Patient is accompained by: Family member   Renee Wohlers, wife   Pertinent History sink    Patient Stated Goals to use prosthesis to get out of w/c, drive,    Currently in Pain? No/denies    Pain Onset 1 to 4 weeks ago                             [facility] Adult PT Treatment/Exercise - 12/18/20 1517      Transfers   Transfers Sit to Stand;Stand to Sit;Squat Pivot Transfers    Sit to Stand 5: Supervision;With upper extremity assist;From chair/3-in-1;Other (comment)   with RW   Sit to Stand Details Visual cues for safe use of DME/AE;Verbal cues for technique    Stand to Sit 5: Supervision;With upper extremity assist;To chair/3-in-1   with RW   Stand to Sit Details (indicate cue type and reason) Visual cues for safe use of DME/AE;Verbal cues for technique      Ambulation/Gait   Ambulation/Gait Yes    Ambulation/Gait Assistance 5: Supervision    Ambulation Distance (Feet) 55 Feet   55' X 2   Assistive device Rolling walker;Prosthesis    Stairs Yes    Stairs Assistance 4: Min assist    Stairs Assistance  Details (indicate cue type and reason) PT demo & verbal cues on sidestepping 2 steps with left rail similar to home, moving RW once pt holding rail & how wife to assist for safety. Wife verbalized understanding.    Stair Management Technique One rail Left;Step to pattern;Sideways   BUEs on left rail.   Number of Stairs 2    Height of Stairs 6      High Level Balance   High Level Balance Activities --    High Level Balance Comments --      Self-Care   ADL's --    Other Self-Care Comments  --      Neuro Re-ed    Neuro Re-ed Details  standing with RW support - 5 reps ea UE & ea exercise red theraband - rows, forward punch, adduction from lateral to across body, upper reach - tactile & verbal cues for equal LE weight  bearing      Knee/Hip Exercises: Stretches   Passive Hamstring Stretch Both;1 rep   5 min ea.   Passive Hamstring Stretch Limitations PT demo & verbal cues how to perform if seated in w/c with contralateral LE in chair or on bed with one LE extended & contralateral over edge of bed. pt & wife verbalized understanding.      Knee/Hip Exercises: Aerobic   Nustep --      Knee/Hip Exercises: Seated   Sit to Sand 1 set;5 reps;without UE support   from 24" bar stool with minA, goal to not touch RW anterior for safety.     Prosthetics   Current prosthetic wear tolerance (days/week)  daily    Current prosthetic wear tolerance (#hours/day)  most of awake hours even when not feeling well    Residual limb condition  reports no issues.                    PT Short Term Goals - 12/11/20 1630      PT SHORT TERM GOAL #1   Title Patient able to reach 7" using RW & prosthesis with supervision.    Time 4    Period Weeks    Status New    Target Date 01/03/21      PT SHORT TERM GOAL #2   Title Patient negotiates curb with RW & prosthesis with minA.    Time 4    Period Weeks    Status On-going    Target Date 01/03/21      PT SHORT TERM GOAL #3   Title Patient ambulates 150' with RW & prosthesis with supervision.    Time 4    Period Weeks    Status On-going    Target Date 01/03/21      PT SHORT TERM GOAL #4   Title Patient negotiates 2 steps with left rail sidestepping with minA.    Time 4    Period Weeks    Status On-going    Target Date 01/03/21             PT Long Term Goals - 11/06/20 1632      PT LONG TERM GOAL #1   Title Patient & wife demonstrate and verbalize understanding of proper prosthetic care to enable safe utililzation of prosthesis.    Time 3    Period Months    Status On-going    Target Date 01/31/21      PT LONG TERM GOAL #2   Title Patient tolerates prosthesis wear >90% of awake hours  without skin or limb pain issues to enable function throughout  his day.    Time 3    Period Months    Status On-going    Target Date 01/31/21      PT LONG TERM GOAL #3   Title Patient able to sit to/from stand from chairs without armrests using UEs to RW and stand pivot transfers with RW modified independent.    Time 3    Period Months    Status New    Target Date 01/31/21      PT LONG TERM GOAL #4   Title Patient ambulates around furniture 27' with RW modified independent for household mobility.    Time 3    Period Months    Status New    Target Date 01/31/21      PT LONG TERM GOAL #5   Title Patient ambulates 200' with RW & prosthesis with supervision to enable community mobility.    Time 3    Period Months    Status Revised    Target Date 01/31/21      Additional Long Term Goals   Additional Long Term Goals Yes      PT LONG TERM GOAL #6   Title Patient negotiates curb / single step with RW & 2 steps with left rail with supervision to enable access to his home ambulating.    Time 3    Period Months    Status New    Target Date 01/31/21                 Plan - 12/18/20 1525    Clinical Impression Statement PT began instruction in negotiating 2 steps with left rail similar to his home.  He did well for first time and wife appears to have basic understanding how to assist.    Personal Factors and Comorbidities Fitness;Time since onset of injury/illness/exacerbation;Comorbidity 2    Comorbidities ALS, HTN    Examination-Activity Limitations Lift;Locomotion Level;Stairs;Stand;Transfers    Examination-Participation Restrictions Community Activity    Stability/Clinical Decision Making Evolving/Moderate complexity    Rehab Potential Good    PT Frequency 2x / week    PT Duration Other (comment)   6 months   PT Treatment/Interventions ADLs/Self Care Home Management;DME Instruction;Gait training;Stair training;Functional mobility training;Therapeutic activities;Therapeutic exercise;Balance training;Neuromuscular  re-education;Patient/family education;Prosthetic Training;Manual techniques;Scar mobilization;Passive range of motion    PT Next Visit Plan work towards updated STGs, review prosthetic care, work on standing balance & prosthetic gait including curb & 2 steps with left rail. therapuetic exercise for strength, flexibility & balance    Consulted and Agree with Plan of Care Patient;Family member/caregiver    Family Member Consulted wife, Migel Hannis           Patient will benefit from skilled therapeutic intervention in order to improve the following deficits and impairments:  Abnormal gait,Cardiopulmonary status limiting activity,Decreased activity tolerance,Decreased balance,Decreased endurance,Decreased knowledge of use of DME,Decreased mobility,Decreased range of motion,Decreased skin integrity,Decreased scar mobility,Decreased strength,Difficulty walking,Impaired flexibility,Impaired UE functional use,Postural dysfunction,Prosthetic Dependency  Visit Diagnosis: Muscle weakness (generalized)  Unsteadiness on feet  Other abnormalities of gait and mobility  Stiffness of right knee, not elsewhere classified  Stiffness of left knee, not elsewhere classified  Abnormal posture     Problem List Patient Active Problem List   Diagnosis Date Noted  . History of left below knee amputation ([facility]) 06/04/2020  . 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 Moderate protein-calorie malnutrition ([facility]) 04/13/2019  . Leg  wound, left, subsequent encounter 10/20/2018  . S/P BKA (below knee amputation) unilateral, left ([facility]) 08/26/2018  . Osteomyelitis ([facility]) 08/01/2018  . Left foot and ankle infection 08/01/2018  . Cellulitis of left foot   . Chronic respiratory insufficiency 06/12/2014  . 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 Essential hypertension 01/25/2013    Vladimir Faster, PT, DPT 12/18/2020, 4:03 PM  [facility] Physical Therapy [address] [place], [place], [address] Phone: [phone]   Fax:  [phone]  Name: Bruce Deleon MRN: [id number] Date of Birth: Nov 29, 1955

## 2020-12-19 ENCOUNTER — Encounter: Payer: Medicare Other | Admitting: Physical Therapy

## 2020-12-25 ENCOUNTER — Other Ambulatory Visit: Payer: Self-pay

## 2020-12-25 ENCOUNTER — Ambulatory Visit (INDEPENDENT_AMBULATORY_CARE_PROVIDER_SITE_OTHER): Payer: Medicare Other | Admitting: Physical Therapy

## 2020-12-25 ENCOUNTER — Encounter: Payer: Self-pay | Admitting: Physical Therapy

## 2020-12-25 DIAGNOSIS — M6281 Muscle weakness (generalized): Secondary | ICD-10-CM | POA: Diagnosis not present

## 2020-12-25 DIAGNOSIS — R2681 Unsteadiness on feet: Secondary | ICD-10-CM

## 2020-12-25 DIAGNOSIS — R2689 Other abnormalities of gait and mobility: Secondary | ICD-10-CM

## 2020-12-25 DIAGNOSIS — M25661 Stiffness of right knee, not elsewhere classified: Secondary | ICD-10-CM

## 2020-12-25 DIAGNOSIS — M25662 Stiffness of left knee, not elsewhere classified: Secondary | ICD-10-CM

## 2020-12-25 DIAGNOSIS — R293 Abnormal posture: Secondary | ICD-10-CM

## 2020-12-25 NOTE — Therapy (Signed)
[facility] [address] [place], [place], [address] Phone: [phone]   Fax:  [phone]  Physical Therapy Treatment  Patient Details  Name: Bruce Deleon MRN: [id number] Date of Birth: Feb 14, 1956 Referring Provider (PT): Doneen Poissonhristopher Blackman, MD   Encounter Date: 12/25/2020   PT End of Session - 12/25/20 1521     Visit Number 17    Number of Visits 36    Date for PT Re-Evaluation 01/31/21    Authorization Type [employer] Medicare    Progress Note Due on Visit 20    PT Start Time 1516    PT Stop Time 1555    PT Time Calculation (min) 39 min    Equipment Utilized During Treatment Gait belt    Activity Tolerance Patient tolerated treatment well;Patient limited by fatigue    Behavior [facility]/performed             Past Medical History:  Diagnosis Date   ALS (amyotrophic lateral sclerosis) (HCC) 01/25/2013   ALS (amyotrophic lateral sclerosis) (HCC)    Chronic respiratory insufficiency 06/12/2014   Essential hypertension 01/25/2013    Past Surgical History:  Procedure Laterality Date   AMPUTATION Left 08/06/2018   Procedure: LEFT BELOW KNEE AMPUTATION;  Surgeon: Kathryne HitchBlackman, Christopher Y, MD;  Location: [facility];  Service: Orthopedics;  Laterality: Left;   APPLICATION OF WOUND VAC Left 08/04/2018   Procedure: APPLICATION OF WOUND VAC;  Surgeon: Kathryne HitchBlackman, Christopher Y, MD;  Location: [facility];  Service: Orthopedics;  Laterality: Left;   I & D EXTREMITY Left 08/01/2018   Procedure: IRRIGATION AND DEBRIDEMENT foot and ankle amputation first and second rays;  Surgeon: Kathryne HitchBlackman, Christopher Y, MD;  Location: [facility];  Service: Orthopedics;  Laterality: Left;   I & D EXTREMITY Left 08/04/2018   Procedure: REPEAT IRRIGATION AND DEBRIDEMENT OF LEFT ANKLE;  Surgeon: Kathryne HitchBlackman, Christopher Y, MD;  Location: [facility];  Service: Orthopedics;  Laterality: Left;    There were no vitals filed for this visit.   Subjective Assessment -  12/25/20 1516     Subjective He is having alternate days of stomach issues and daily fatigue. No falls. He got basket for walker to carry items in home.    Patient is accompained by: Family member   Renee Yanke, wife   Pertinent History sink    Patient Stated Goals to use prosthesis to get out of w/c, drive,    Currently in Pain? No/denies    Pain Onset 1 to 4 weeks ago                               [facility] - 12/25/20 1516       Transfers   Transfers Sit to Stand;Stand to Sit;Squat Pivot Transfers    Sit to Stand 5: Supervision;With upper extremity assist;From chair/3-in-1;Other (comment)   with RW   Sit to Stand Details Visual cues for safe use of DME/AE;Verbal cues for technique    Stand to Sit 5: Supervision;With upper extremity assist;To chair/3-in-1   with RW   Stand to Sit Details (indicate cue type and reason) Visual cues for safe use of DME/AE;Verbal cues for technique      Ambulation/Gait   Ambulation/Gait Yes    Ambulation/Gait Assistance 5: Supervision    Ambulation Distance (Feet) 55 Feet   55' X 2   Assistive device Rolling walker;Prosthesis    Stairs Yes  Stairs Assistance 4: Min assist;2: Max assist   left knee buckled one step ascending with maxA to prevent fall   Stairs Assistance Details (indicate cue type and reason) verbal cues on technique with left rail similar to his home & side stepping    Stair Management Technique One rail Left;Step to pattern;Sideways   BUEs on left rail.   Number of Stairs 2    Height of Stairs 6    Ramp 4: Min assist   RW & TTA prosthesis   Ramp Details (indicate cue type and reason) tactile & verbal cues on technique    Curb 4: Min assist   RW & TTA prosthesis   Curb Details (indicate cue type and reason) verbal cues on technique      Neuro Re-ed    Neuro Re-ed Details  standing with RW support - 5 reps ea UE & ea exercise red theraband - rows, forward punch, adduction from lateral  to across body, upper reach - tactile & verbal cues for equal LE weight bearing      Knee/Hip Exercises: Stretches   Active Hamstring Stretch Both;2 reps;30 seconds    Active Hamstring Stretch Limitations AAROM supine SLR with strap      Knee/Hip Exercises: Machines for Strengthening   Cybex Leg Press shuttle leg press BLEs 50# 10 reps 2 sets 1st set back 45* & 2nd set back flat      Knee/Hip Exercises: Seated   Sit to Sand 1 set;5 reps;without UE support   from 24" bar stool with minA, goal to not touch RW anterior for safety.     Prosthetics   Current prosthetic wear tolerance (days/week)  daily    Current prosthetic wear tolerance (#hours/day)  most of awake hours even when not feeling well    Residual limb condition  reports no issues.                      PT Short Term Goals - 12/11/20 1630       PT SHORT TERM GOAL #1   Title Patient able to reach 7" using RW & prosthesis with supervision.    Time 4    Period Weeks    Status New    Target Date 01/03/21      PT SHORT TERM GOAL #2   Title Patient negotiates curb with RW & prosthesis with minA.    Time 4    Period Weeks    Status On-going    Target Date 01/03/21      PT SHORT TERM GOAL #3   Title Patient ambulates 150' with RW & prosthesis with supervision.    Time 4    Period Weeks    Status On-going    Target Date 01/03/21      PT SHORT TERM GOAL #4   Title Patient negotiates 2 steps with left rail sidestepping with minA.    Time 4    Period Weeks    Status On-going    Target Date 01/03/21               PT Long Term Goals - 11/06/20 1632       PT LONG TERM GOAL #1   Title Patient & wife demonstrate and verbalize understanding of proper prosthetic care to enable safe utililzation of prosthesis.    Time 3    Period Months    Status On-going    Target Date 01/31/21      PT LONG  TERM GOAL #2   Title Patient tolerates prosthesis wear >90% of awake hours without skin or limb pain issues to  enable function throughout his day.    Time 3    Period Months    Status On-going    Target Date 01/31/21      PT LONG TERM GOAL #3   Title Patient able to sit to/from stand from chairs without armrests using UEs to RW and stand pivot transfers with RW modified independent.    Time 3    Period Months    Status New    Target Date 01/31/21      PT LONG TERM GOAL #4   Title Patient ambulates around furniture 78' with RW modified independent for household mobility.    Time 3    Period Months    Status New    Target Date 01/31/21      PT LONG TERM GOAL #5   Title Patient ambulates 200' with RW & prosthesis with supervision to enable community mobility.    Time 3    Period Months    Status Revised    Target Date 01/31/21      Additional Long Term Goals   Additional Long Term Goals Yes      PT LONG TERM GOAL #6   Title Patient negotiates curb / single step with RW & 2 steps with left rail with supervision to enable access to his home ambulating.    Time 3    Period Months    Status New    Target Date 01/31/21                   Plan - 12/25/20 1521     Clinical Impression Statement Patient is improving with prosthetic gait with RW.  PT session included strength, flexibility and balance activities. He is on target to meet STGs next week.    Personal Factors and Comorbidities Fitness;Time since onset of injury/illness/exacerbation;Comorbidity 2    Comorbidities ALS, HTN    Examination-Activity Limitations Lift;Locomotion Level;Stairs;Stand;Transfers    Examination-Participation Restrictions Community Activity    Stability/Clinical Decision Making Evolving/Moderate complexity    Rehab Potential Good    PT Frequency 2x / week    PT Duration Other (comment)   6 months   PT Treatment/Interventions ADLs/Self Care Home Management;DME Instruction;Gait training;Stair training;Functional mobility training;Therapeutic activities;Therapeutic exercise;Balance  training;Neuromuscular re-education;Patient/family education;Prosthetic Training;Manual techniques;Scar mobilization;Passive range of motion    PT Next Visit Plan work towards updated STGs, review prosthetic care, work on standing balance & prosthetic gait including curb & 2 steps with left rail. therapuetic exercise for strength, flexibility & balance    Consulted and Agree with Plan of Care Patient;Family member/caregiver    Family Member Consulted wife, Owen Pratte             Patient will benefit from skilled therapeutic intervention in order to improve the following deficits and impairments:  Abnormal gait, Cardiopulmonary status limiting activity, Decreased activity tolerance, Decreased balance, Decreased endurance, Decreased knowledge of use of DME, Decreased mobility, Decreased range of motion, Decreased skin integrity, Decreased scar mobility, Decreased strength, Difficulty walking, Impaired flexibility, Impaired UE functional use, Postural dysfunction, Prosthetic Dependency  Visit Diagnosis: Unsteadiness on feet  Muscle weakness (generalized)  Other abnormalities of gait and mobility  Stiffness of right knee, not elsewhere classified  Stiffness of left knee, not elsewhere classified  Abnormal posture     Problem List Patient Active Problem List   Diagnosis Date Noted  History of left below knee amputation (HCC) 06/04/2020   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   Moderate protein-calorie malnutrition (HCC) 04/13/2019   Leg wound, left, subsequent encounter 10/20/2018   S/P BKA (below knee amputation) unilateral, left (HCC) 08/26/2018   Osteomyelitis (HCC) 08/01/2018   Left foot and ankle infection 08/01/2018   Cellulitis of left foot    Chronic respiratory insufficiency 06/12/2014   ALS (amyotrophic lateral sclerosis) (HCC) 01/25/2013   Essential hypertension 01/25/2013    Vladimir Faster, PT, DPT 12/25/2020, 3:58 PM  [facility] Physical Therapy [address] [place], [place], [address] Phone: [phone]   Fax:  [phone]  Name: Bruce Deleon MRN: [id number] Date of Birth: [birth year]

## 2020-12-26 ENCOUNTER — Encounter: Payer: Self-pay | Admitting: Physical Therapy

## 2020-12-26 ENCOUNTER — Ambulatory Visit (INDEPENDENT_AMBULATORY_CARE_PROVIDER_SITE_OTHER): Payer: Medicare Other | Admitting: Physical Therapy

## 2020-12-26 DIAGNOSIS — R2681 Unsteadiness on feet: Secondary | ICD-10-CM

## 2020-12-26 DIAGNOSIS — M6281 Muscle weakness (generalized): Secondary | ICD-10-CM | POA: Diagnosis not present

## 2020-12-26 DIAGNOSIS — M25662 Stiffness of left knee, not elsewhere classified: Secondary | ICD-10-CM

## 2020-12-26 DIAGNOSIS — M25661 Stiffness of right knee, not elsewhere classified: Secondary | ICD-10-CM | POA: Diagnosis not present

## 2020-12-26 DIAGNOSIS — R2689 Other abnormalities of gait and mobility: Secondary | ICD-10-CM

## 2020-12-26 DIAGNOSIS — R293 Abnormal posture: Secondary | ICD-10-CM

## 2020-12-26 NOTE — Therapy (Signed)
[facility] Therapy [address] [place], [place], [address] Phone: [phone]   Fax:  [phone]  Physical Therapy Treatment  Patient Details  Name: Bruce Deleon MRN: [id number] Date of Birth: 03-01-1956 Referring Provider (PT): Doneen Poisson, MD   Encounter Date: 12/26/2020   PT End of Session - 12/26/20 1304     Visit Number 18    Number of Visits 36    Date for PT Re-Evaluation 01/31/21    Authorization Type [employer] Medicare    Progress Note Due on Visit 20    PT Start Time 1258    PT Stop Time 1336    PT Time Calculation (min) 38 min    Equipment Utilized During Treatment Gait belt    Activity Tolerance Patient tolerated treatment well;Patient limited by fatigue    Behavior During Therapy [facility] for tasks assessed/performed             Past Medical History:  Diagnosis Date   ALS (amyotrophic lateral sclerosis) (HCC) 01/25/2013   ALS (amyotrophic lateral sclerosis) (HCC)    Chronic respiratory insufficiency 06/12/2014   Essential hypertension 01/25/2013    Past Surgical History:  Procedure Laterality Date   AMPUTATION Left 08/06/2018   Procedure: LEFT BELOW KNEE AMPUTATION;  Surgeon: Kathryne Hitch, MD;  Location: [facility];  Service: Orthopedics;  Laterality: Left;   APPLICATION OF WOUND VAC Left 08/04/2018   Procedure: APPLICATION OF WOUND VAC;  Surgeon: Kathryne Hitch, MD;  Location: [facility];  Service: Orthopedics;  Laterality: Left;   I & D EXTREMITY Left 08/01/2018   Procedure: IRRIGATION AND DEBRIDEMENT foot and ankle amputation first and second rays;  Surgeon: Kathryne Hitch, MD;  Location: [facility];  Service: Orthopedics;  Laterality: Left;   I & D EXTREMITY Left 08/04/2018   Procedure: REPEAT IRRIGATION AND DEBRIDEMENT OF LEFT ANKLE;  Surgeon: Kathryne Hitch, MD;  Location: [facility];  Service: Orthopedics;  Laterality: Left;    There were no vitals filed for this visit.   Subjective Assessment -  12/26/20 1300     Subjective His right knee was sore after PT yesterday.    Patient is accompained by: Family member   Bruce Deleon, wife   Pertinent History sink    Patient Stated Goals to use prosthesis to get out of w/c, drive,    Currently in Pain? No/denies    Pain Onset 1 to 4 weeks ago                               [facility] Adult PT Treatment/Exercise - 12/26/20 1300       Transfers   Transfers Sit to Stand;Stand to Sit;Squat Pivot Transfers    Sit to Stand 5: Supervision;With upper extremity assist;From chair/3-in-1;Other (comment)   with RW   Sit to Stand Details Visual cues for safe use of DME/AE;Verbal cues for technique    Stand to Sit 5: Supervision;With upper extremity assist;To chair/3-in-1   with RW   Stand to Sit Details (indicate cue type and reason) Visual cues for safe use of DME/AE;Verbal cues for technique      Ambulation/Gait   Ambulation/Gait Yes    Ambulation/Gait Assistance 5: Supervision    Ambulation Distance (Feet) 207 Feet   max tolerable distance   Assistive device Rolling walker;Prosthesis    Gait Comments PT instructed pt & wife in 1) increasing frequency of short walks / room to room in home  and 2) 1x /day when wife available following with w/c walk max tolerable distance.      Neuro Re-ed    Neuro Re-ed Details  standing with RW support - 5 reps ea UE & ea exercise red theraband - rows, forward punch, adduction from lateral to across body, upper reach - tactile & verbal cues for equal LE weight bearing      Knee/Hip Exercises: Stretches   Active Hamstring Stretch Both;2 reps;30 seconds    Active Hamstring Stretch Limitations AAROM supine SLR with strap      Knee/Hip Exercises: Machines for Strengthening   Cybex Leg Press shuttle leg press BLEs 50# 10 reps 2 sets 1st set back 45* & 2nd set back flat      Prosthetics   Current prosthetic wear tolerance (days/week)  daily    Current prosthetic wear tolerance (#hours/day)  most  of awake hours even when not feeling well    Residual limb condition  reports no issues.                      PT Short Term Goals - 12/11/20 1630       PT SHORT TERM GOAL #1   Title Patient able to reach 7" using RW & prosthesis with supervision.    Time 4    Period Weeks    Status New    Target Date 01/03/21      PT SHORT TERM GOAL #2   Title Patient negotiates curb with RW & prosthesis with minA.    Time 4    Period Weeks    Status On-going    Target Date 01/03/21      PT SHORT TERM GOAL #3   Title Patient ambulates 150' with RW & prosthesis with supervision.    Time 4    Period Weeks    Status On-going    Target Date 01/03/21      PT SHORT TERM GOAL #4   Title Patient negotiates 2 steps with left rail sidestepping with minA.    Time 4    Period Weeks    Status On-going    Target Date 01/03/21               PT Long Term Goals - 11/06/20 1632       PT LONG TERM GOAL #1   Title Patient & wife demonstrate and verbalize understanding of proper prosthetic care to enable safe utililzation of prosthesis.    Time 3    Period Months    Status On-going    Target Date 01/31/21      PT LONG TERM GOAL #2   Title Patient tolerates prosthesis wear >90% of awake hours without skin or limb pain issues to enable function throughout his day.    Time 3    Period Months    Status On-going    Target Date 01/31/21      PT LONG TERM GOAL #3   Title Patient able to sit to/from stand from chairs without armrests using UEs to RW and stand pivot transfers with RW modified independent.    Time 3    Period Months    Status New    Target Date 01/31/21      PT LONG TERM GOAL #4   Title Patient ambulates around furniture 34' with RW modified independent for household mobility.    Time 3    Period Months    Status New    Target  Date 01/31/21      PT LONG TERM GOAL #5   Title Patient ambulates 200' with RW & prosthesis with supervision to enable community  mobility.    Time 3    Period Months    Status Revised    Target Date 01/31/21      Additional Long Term Goals   Additional Long Term Goals Yes      PT LONG TERM GOAL #6   Title Patient negotiates curb / single step with RW & 2 steps with left rail with supervision to enable access to his home ambulating.    Time 3    Period Months    Status New    Target Date 01/31/21                   Plan - 12/26/20 1305     Clinical Impression Statement Patient's knees are sore with stretches in new range.  Patient improved his max tolerable distance for gait.    Personal Factors and Comorbidities Fitness;Time since onset of injury/illness/exacerbation;Comorbidity 2    Comorbidities ALS, HTN    Examination-Activity Limitations Lift;Locomotion Level;Stairs;Stand;Transfers    Examination-Participation Restrictions Community Activity    Stability/Clinical Decision Making Evolving/Moderate complexity    Rehab Potential Good    PT Frequency 2x / week    PT Duration Other (comment)   6 months   PT Treatment/Interventions ADLs/Self Care Home Management;DME Instruction;Gait training;Stair training;Functional mobility training;Therapeutic activities;Therapeutic exercise;Balance training;Neuromuscular re-education;Patient/family education;Prosthetic Training;Manual techniques;Scar mobilization;Passive range of motion    PT Next Visit Plan check updated STGs, review prosthetic care, work on standing balance & prosthetic gait including curb & 2 steps with left rail. therapuetic exercise for strength, flexibility & balance    Consulted and Agree with Plan of Care Patient;Family member/caregiver    Family Member Consulted wife, Bruce Deleon             Patient will benefit from skilled therapeutic intervention in order to improve the following deficits and impairments:  Abnormal gait, Cardiopulmonary status limiting activity, Decreased activity tolerance, Decreased balance, Decreased  endurance, Decreased knowledge of use of DME, Decreased mobility, Decreased range of motion, Decreased skin integrity, Decreased scar mobility, Decreased strength, Difficulty walking, Impaired flexibility, Impaired UE functional use, Postural dysfunction, Prosthetic Dependency  Visit Diagnosis: Unsteadiness on feet  Muscle weakness (generalized)  Other abnormalities of gait and mobility  Stiffness of right knee, not elsewhere classified  Stiffness of left knee, not elsewhere classified  Abnormal posture     Problem List Patient Active Problem List   Diagnosis Date Noted   History of left below knee amputation (HCC) 06/04/2020   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 [place], [place], [address] Phone: [phone]   Fax:  [phone]  Name: Bruce Deleon MRN: [id number] Date of Birth: 25-Jan-1956

## 2021-01-01 ENCOUNTER — Encounter: Payer: Medicare Other | Admitting: Physical Therapy

## 2021-01-02 ENCOUNTER — Encounter: Payer: Medicare Other | Admitting: Physical Therapy

## 2021-01-08 ENCOUNTER — Encounter: Payer: Medicare Other | Admitting: Physical Therapy

## 2021-01-09 ENCOUNTER — Encounter: Payer: Self-pay | Admitting: Physician Assistant

## 2021-01-09 ENCOUNTER — Encounter: Payer: Medicare Other | Admitting: Physical Therapy

## 2021-01-09 ENCOUNTER — Ambulatory Visit: Payer: Self-pay

## 2021-01-09 ENCOUNTER — Ambulatory Visit: Payer: Medicare Other | Admitting: Physician Assistant

## 2021-01-09 DIAGNOSIS — Z89512 Acquired absence of left leg below knee: Secondary | ICD-10-CM | POA: Diagnosis not present

## 2021-01-09 NOTE — Progress Notes (Addendum)
HPI: Mr. Tudisco returns today due to increasing left leg pain.  He underwent a left below-knee amputation on 08/06/2018.  He has done well until recently.  This past Saturday he started having some pain around the incision site.  He is in his prosthesis is removed and working with physical therapy.  He has had no new injury.  He has had no fevers chills.  Physical exam: Left below-knee amputation stump wound is well-healed.  He does have some slight bony prominence over the distal tibial portion.  There is tenderness in this area but no skin breakdown.  He has near full extension and has full flexion of the knee.  Radiographs left knee 2 views: Knee is well located.  No acute fractures.  There is slight bony osteophytes off the distal tibia.  No bony lesions or evidence of osteomyelitis.  Impression: Status post left below-knee amputation  Plan: He will work on scar tissue mobilization.  Discussed with him the use of wearing a well-padded sock.  I did give him this stump shrinker that has padding over the distal end.  We will see how he does with this.  If he has continued pain or any wound breakdown he will follow-up with [place].  Otherwise follow-up as needed.  Questions were encouraged and answered at length.  Addendum: Patient would benefit from a socket revision with suction suspension without an umbrella in liner to decrease contact with his skin.We we will fax over a prescription to [facility] for socket revision with suction suspension.

## 2021-01-21 ENCOUNTER — Ambulatory Visit (INDEPENDENT_AMBULATORY_CARE_PROVIDER_SITE_OTHER): Payer: Medicare Other | Admitting: Physical Therapy

## 2021-01-21 ENCOUNTER — Telehealth: Payer: Self-pay | Admitting: Physical Therapy

## 2021-01-21 ENCOUNTER — Other Ambulatory Visit: Payer: Self-pay

## 2021-01-21 ENCOUNTER — Encounter: Payer: Self-pay | Admitting: Orthopaedic Surgery

## 2021-01-21 ENCOUNTER — Encounter: Payer: Self-pay | Admitting: Physical Therapy

## 2021-01-21 DIAGNOSIS — M25662 Stiffness of left knee, not elsewhere classified: Secondary | ICD-10-CM

## 2021-01-21 DIAGNOSIS — R2681 Unsteadiness on feet: Secondary | ICD-10-CM | POA: Diagnosis not present

## 2021-01-21 DIAGNOSIS — R2689 Other abnormalities of gait and mobility: Secondary | ICD-10-CM

## 2021-01-21 DIAGNOSIS — M25661 Stiffness of right knee, not elsewhere classified: Secondary | ICD-10-CM

## 2021-01-21 DIAGNOSIS — M6281 Muscle weakness (generalized): Secondary | ICD-10-CM

## 2021-01-21 DIAGNOSIS — R293 Abnormal posture: Secondary | ICD-10-CM

## 2021-01-21 NOTE — Telephone Encounter (Signed)
Please advise 

## 2021-01-21 NOTE — Therapy (Addendum)
[facility] Physical Therapy [address] [place], [place], [address] Phone: [phone]   Fax:  [phone]   PHYSICAL THERAPY DISCHARGE SUMMARY on 04/17/2021 Patient has not returned since this PT appointment He is working getting new socket for his prosthesis.  Due to length of time since last seen by PT he will need to be re-evaluated. Please send a new referral to PT at that time.     Visits from Start of Care: 19  Current functional level related to goals / functional outcomes: See below   Remaining deficits: See below   Education / Equipment: Prosthetic care & HEP   Patient agrees to discharge. Patient goals were not met. Patient is being discharged due to  getting new prosthetic socket was placed on hold but has not been seen in almost 3 months.     Physical Therapy Treatment  Patient Details  Name: Bruce Deleon MRN: [id number] Date of Birth: 05-29-1956 Referring Provider (PT): Jean Rosenthal, MD   Encounter Date: 01/21/2021   PT End of Session - 01/21/21 1130     Visit Number 19    Number of Visits 36    Date for PT Re-Evaluation 01/31/21    Authorization Type [employer] Medicare    Progress Note Due on Visit 20    PT Start Time 1130    PT Stop Time 1215    PT Time Calculation (min) 45 min    Equipment Utilized During Treatment Gait belt    Activity Tolerance Patient tolerated treatment well;Patient limited by fatigue    Behavior During Therapy [facility] for tasks assessed/performed             Past Medical History:  Diagnosis Date   ALS (amyotrophic lateral sclerosis) ([place]) 01/25/2013   ALS (amyotrophic lateral sclerosis) ([place])    Chronic respiratory insufficiency 06/12/2014   Essential hypertension 01/25/2013    Past Surgical History:  Procedure Laterality Date   AMPUTATION Left 08/06/2018   Procedure: LEFT BELOW KNEE AMPUTATION;  Surgeon: Mcarthur Rossetti, MD;  Location: [facility];  Service: Orthopedics;  Laterality: Left;    APPLICATION OF WOUND VAC Left 08/04/2018   Procedure: APPLICATION OF WOUND VAC;  Surgeon: Mcarthur Rossetti, MD;  Location: [facility];  Service: Orthopedics;  Laterality: Left;   I & D EXTREMITY Left 08/01/2018   Procedure: IRRIGATION AND DEBRIDEMENT foot and ankle amputation first and second rays;  Surgeon: Mcarthur Rossetti, MD;  Location: [facility];  Service: Orthopedics;  Laterality: Left;   I & D EXTREMITY Left 08/04/2018   Procedure: REPEAT IRRIGATION AND DEBRIDEMENT OF LEFT ANKLE;  Surgeon: Mcarthur Rossetti, MD;  Location: [facility];  Service: Orthopedics;  Laterality: Left;    There were no vitals filed for this visit.   Subjective Assessment - 01/21/21 1130     Subjective He saw Benita Stabile, PA on 6/29 and Xray showed some bone spurs on distal tibia.  He has tried the [employer] under liner as recommended. But it does not help pain & causes prosthesis to slide off.  He has not worn prosthesis for over week as it hurts too much.    Patient is accompained by: Family member   Renee Howley, wife   Pertinent History sink    Patient Stated Goals to use prosthesis to get out of w/c, drive,    Currently in Pain? Yes    Pain Score 5    5/10 sitting with prosthesis on limb & 10/10 with any weight bearing  with prosthesis   Pain Location Leg    Pain Orientation Left;Distal    Pain Descriptors / Indicators Sharp    Pain Type Other (Comment)   prosthetic   Pain Onset 1 to 4 weeks ago    Pain Frequency Constant    Aggravating Factors  wearing prosthesis & any weight bearing    Pain Relieving Factors taking prosthesis off.                               [facility] Adult PT Treatment/Exercise - 01/21/21 1130       Transfers   Transfers Lateral/Scoot Transfers    Lateral/Scoot Transfers 5: Supervision;With armrests removed      Knee/Hip Exercises: Aerobic   Nustep Seat 12  level 2 for 8 min      Prosthetics   Prosthetic Care Comments  Patient received his  prosthesis on 08/06/2020 and should be eligible for a socket revision at 6 months with proper documentation.  PT recommended not using [employer] shrinker under liner as he does not have a wound and it does not provide enough padding. PT recommending new system that does not have an umbrella in liner in direct contact with distal tibia.  PT spoke with prosthetist who will work on new system.  Continue scar mobilizations.   Wear up to 2 hrs esp when going out of house.    Current prosthetic wear tolerance (days/week)  only 1-2 days over last 2 weeks due to pain.    Current prosthetic wear tolerance (#hours/day)  2hrs at time since increased distal tibia pain.    Residual limb condition  no open areas, scar adhered to distal tibia, bony conical shape,    Education Provided Residual limb care;Proper wear schedule/adjustment;Other (comment)   see prosthetic care comments   Person(s) Educated Patient;Spouse    Education Method Explanation;Verbal cues    Education Method Verbalized understanding    Donning Prosthesis Supervision                      PT Short Term Goals - 01/21/21 1242       PT SHORT TERM GOAL #1   Title Patient able to reach 7" using RW & prosthesis with supervision.    Baseline NOT MET 01/21/2021 as residual limb pain limiting wear, standing & gait with prosthesis.    Time 4    Period Weeks    Status Not Met    Target Date 01/03/21      PT SHORT TERM GOAL #2   Title Patient negotiates curb with RW & prosthesis with minA.    Baseline NOT MET 01/21/2021 as residual limb pain limiting wear, standing & gait with prosthesis.    Time 4    Period Weeks    Status Not Met    Target Date 01/03/21      PT SHORT TERM GOAL #3   Title Patient ambulates 150' with RW & prosthesis with supervision.    Baseline NOT MET 01/21/2021 as residual limb pain limiting wear, standing & gait with prosthesis.    Time 4    Period Weeks    Status Not Met    Target Date 01/03/21      PT  SHORT TERM GOAL #4   Title Patient negotiates 2 steps with left rail sidestepping with minA.    Baseline NOT MET 01/21/2021 as residual limb pain limiting wear, standing & gait with  prosthesis.    Time 4    Period Weeks    Status Not Met    Target Date 01/03/21               PT Long Term Goals - 11/06/20 1632       PT LONG TERM GOAL #1   Title Patient & wife demonstrate and verbalize understanding of proper prosthetic care to enable safe utililzation of prosthesis.    Time 3    Period Months    Status On-going    Target Date 01/31/21      PT LONG TERM GOAL #2   Title Patient tolerates prosthesis wear >90% of awake hours without skin or limb pain issues to enable function throughout his day.    Time 3    Period Months    Status On-going    Target Date 01/31/21      PT LONG TERM GOAL #3   Title Patient able to sit to/from stand from chairs without armrests using UEs to RW and stand pivot transfers with RW modified independent.    Time 3    Period Months    Status New    Target Date 01/31/21      PT LONG TERM GOAL #4   Title Patient ambulates around furniture 20' with RW modified independent for household mobility.    Time 3    Period Months    Status New    Target Date 01/31/21      PT LONG TERM GOAL #5   Title Patient ambulates 200' with RW & prosthesis with supervision to enable community mobility.    Time 3    Period Months    Status Revised    Target Date 01/31/21      Additional Long Term Goals   Additional Long Term Goals Yes      PT LONG TERM GOAL #6   Title Patient negotiates curb / single step with RW & 2 steps with left rail with supervision to enable access to his home ambulating.    Time 3    Period Months    Status New    Target Date 01/31/21                   Plan - 01/21/21 1130     Clinical Impression Statement Patient's residual limb pain at distal tibia has increased to point that he can not tolerate standing or gait.  PT  recommending socket revision with suction suspension that would not put an umbrella in contact with distal tibia.  PT recommending to hold PT until he receives new socket as pain would limit his progress significantly.    Personal Factors and Comorbidities Fitness;Time since onset of injury/illness/exacerbation;Comorbidity 2    Comorbidities ALS, HTN    Examination-Activity Limitations Lift;Locomotion Level;Stairs;Stand;Transfers    Examination-Participation Restrictions Community Activity    Stability/Clinical Decision Making Evolving/Moderate complexity    Rehab Potential Good    PT Frequency 2x / week    PT Duration Other (comment)   6 months   PT Treatment/Interventions ADLs/Self Care Home Management;DME Instruction;Gait training;Stair training;Functional mobility training;Therapeutic activities;Therapeutic exercise;Balance training;Neuromuscular re-education;Patient/family education;Prosthetic Training;Manual techniques;Scar mobilization;Passive range of motion    PT Next Visit Plan hold PT until he receives socket revision, then assess & recertify for additional PT.    Consulted and Agree with Plan of Care Patient;Family member/caregiver    Family Member Consulted wife, Dinnis Rog  Patient will benefit from skilled therapeutic intervention in order to improve the following deficits and impairments:  Abnormal gait, Cardiopulmonary status limiting activity, Decreased activity tolerance, Decreased balance, Decreased endurance, Decreased knowledge of use of DME, Decreased mobility, Decreased range of motion, Decreased skin integrity, Decreased scar mobility, Decreased strength, Difficulty walking, Impaired flexibility, Impaired UE functional use, Postural dysfunction, Prosthetic Dependency  Visit Diagnosis: Muscle weakness (generalized)  Unsteadiness on feet  Other abnormalities of gait and mobility  Stiffness of right knee, not elsewhere classified  Stiffness of left  knee, not elsewhere classified  Abnormal posture     Problem List Patient Active Problem List   Diagnosis Date Noted   History of left below knee amputation ([place]) 06/04/2020   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 [place], [place], [address] Phone: [phone]   Fax:  [phone]  Name: Bruce Deleon MRN: [id number] Date of Birth: 27-Jan-1956

## 2021-01-21 NOTE — Telephone Encounter (Signed)
Bruce Deleon tried the [employer] under the liner like you recommended on office visit on 01/09/2021.  It does not help with his pain and causes liner & prosthesis to slide off his limb which is a safety concern.  He received his prosthesis on 08/06/2020 and would be eligible for a socket revision at 6 months which in 02/03/2021.  I think that the hard end piece (umbrella) in the liner is not tolerated by him. He has a bony limb and the bone spurs make this system painful for him.  I am recommending that we switch socket designs to one without an umbrella in liner in direct contact with his limb.   In order for insurance to cover a new socket, he will need documentation of an office visit for "face to face" discussion of need for socket revision with suction suspension.  Can you please addend your office note on 01/09/2021 to document this need? Please FAX this note & a prescription for socket revision with suction suspension to [facility] at [phone]. Please let me know if you have questions, Zella Ball

## 2021-01-23 NOTE — Telephone Encounter (Signed)
I printed letter to our area. Can you put this at the front desk please?

## 2021-01-23 NOTE — Telephone Encounter (Signed)
Letter completed.

## 2021-01-28 ENCOUNTER — Encounter: Payer: Medicare Other | Admitting: Physical Therapy

## 2021-01-30 ENCOUNTER — Encounter: Payer: Medicare Other | Admitting: Physical Therapy

## 2021-02-04 ENCOUNTER — Encounter: Payer: Medicare Other | Admitting: Physical Therapy

## 2021-02-06 ENCOUNTER — Encounter: Payer: Medicare Other | Admitting: Physical Therapy

## 2021-02-27 NOTE — Telephone Encounter (Signed)
Pt wife called and is confused about the message gil left and she would ike a phone call.   CB [phone]

## 2021-06-05 ENCOUNTER — Telehealth: Payer: Self-pay | Admitting: Orthopaedic Surgery

## 2021-06-05 ENCOUNTER — Other Ambulatory Visit: Payer: Self-pay

## 2021-06-05 DIAGNOSIS — Z89512 Acquired absence of left leg below knee: Secondary | ICD-10-CM

## 2021-06-05 NOTE — Telephone Encounter (Signed)
Sent referral 

## 2021-06-05 NOTE — Telephone Encounter (Signed)
Patient's wife called. She would like a new referral for physical therapy to see Robin.

## 2021-06-19 ENCOUNTER — Encounter: Payer: Medicare Other | Admitting: Physical Therapy

## 2021-07-02 ENCOUNTER — Encounter: Payer: Self-pay | Admitting: Physical Therapy

## 2021-07-02 ENCOUNTER — Other Ambulatory Visit: Payer: Self-pay

## 2021-07-02 ENCOUNTER — Ambulatory Visit (INDEPENDENT_AMBULATORY_CARE_PROVIDER_SITE_OTHER): Payer: Medicare Other | Admitting: Physical Therapy

## 2021-07-02 DIAGNOSIS — R2689 Other abnormalities of gait and mobility: Secondary | ICD-10-CM

## 2021-07-02 DIAGNOSIS — M6281 Muscle weakness (generalized): Secondary | ICD-10-CM | POA: Diagnosis not present

## 2021-07-02 DIAGNOSIS — R2681 Unsteadiness on feet: Secondary | ICD-10-CM | POA: Diagnosis not present

## 2021-07-02 DIAGNOSIS — M25662 Stiffness of left knee, not elsewhere classified: Secondary | ICD-10-CM

## 2021-07-02 DIAGNOSIS — R293 Abnormal posture: Secondary | ICD-10-CM

## 2021-07-02 DIAGNOSIS — M25661 Stiffness of right knee, not elsewhere classified: Secondary | ICD-10-CM | POA: Diagnosis not present

## 2021-07-02 NOTE — Therapy (Addendum)
[facility] Physical Therapy [address] [place], [place], [address] Phone: [phone]   Fax:  [phone]  PHYSICAL THERAPY DISCHARGE SUMMARY  Visits from Start of Care: 1  Evaluation only  Current functional level related to goals / functional outcomes: No change as patient decided he did not want to do PT    Remaining deficits: Same as evaluation   Education / Equipment: Basic difference with prosthetic care with new prosthesis   Patient agrees to discharge. Patient goals were not met. Patient is being discharged due to the patient's request.  08/07/2021 Discharge Summary Jamey Reas, PT, DPT Physical Therapist Specializing in Prosthetic Rehab [facility]. [place], [place] [address] Phone [phone] FAX [phone]     Physical Therapy Evaluation  Patient Details  Name: Bruce Deleon MRN: [id number] Date of Birth: Dec 25, 1955 Referring Provider (PT): Jean Rosenthal, MD   Encounter Date: 07/02/2021   PT End of Session - 07/02/21 1650     Visit Number 1    Number of Visits 25    Date for PT Re-Evaluation 09/26/21    Authorization Type [employer] Medicare    Progress Note Due on Visit 10    PT Start Time 1434    PT Stop Time 1512    PT Time Calculation (min) 38 min    Equipment Utilized During Treatment Gait belt    Activity Tolerance Patient tolerated treatment well;Patient limited by fatigue    Behavior During Therapy [facility] for tasks assessed/performed;Flat affect             Past Medical History:  Diagnosis Date   ALS (amyotrophic lateral sclerosis) ([place]) 01/25/2013   ALS (amyotrophic lateral sclerosis) ([place])    Chronic respiratory insufficiency 06/12/2014   Essential hypertension 01/25/2013    Past Surgical History:  Procedure Laterality Date   AMPUTATION Left 08/06/2018   Procedure: LEFT BELOW KNEE AMPUTATION;  Surgeon: Mcarthur Rossetti, MD;  Location: [facility];  Service:  Orthopedics;  Laterality: Left;   APPLICATION OF WOUND VAC Left 08/04/2018   Procedure: APPLICATION OF WOUND VAC;  Surgeon: Mcarthur Rossetti, MD;  Location: [facility];  Service: Orthopedics;  Laterality: Left;   I & D EXTREMITY Left 08/01/2018   Procedure: IRRIGATION AND DEBRIDEMENT foot and ankle amputation first and second rays;  Surgeon: Mcarthur Rossetti, MD;  Location: [facility];  Service: Orthopedics;  Laterality: Left;   I & D EXTREMITY Left 08/04/2018   Procedure: REPEAT IRRIGATION AND DEBRIDEMENT OF LEFT ANKLE;  Surgeon: Mcarthur Rossetti, MD;  Location: [facility];  Service: Orthopedics;  Laterality: Left;    There were no vitals filed for this visit.    Subjective Assessment - 07/02/21 1439     Subjective This 65yo male was referred on 06/05/2021 to PT by Jean Rosenthal, MD with [phone] (ICD-10-CM) - History of left below knee amputation ([place]). He underwent a left BKA on 08/06/2018. He saw PT for 19 visits 08/22/2020-01/21/2021 with significant progress but was limited by residual limb pain.  He received a socket revision with change in design & suspension on 05/09/2021. Patient reports he feels weaker like his ALS may be progressing. He plans to make appt in [place] [facility] in new year.  His wife is working 35hrs /wk outside of home and she is carrying for her disable mother so he has limited time to do things that require assistance.    Patient is accompained by: Family member    Pertinent  History left TTA, ALS ~37yr with rt side weakness > lt side, HTN,    Limitations Walking;House hold activities;Standing    Patient Stated Goals To use prosthesis to move in apt    Currently in Pain? No/denies                [facility] - 07/02/21 1434       Assessment   Medical Diagnosis Z89.512 (ICD-10-CM) - History of left below knee amputation    Referring Provider (PT) CJean Rosenthal MD    Onset Date/Surgical Date 06/05/21   MD referral to PT   Hand  Dominance Right    Prior Therapy 19 visits 08/22/2020-01/21/2021      Precautions   Precautions Fall      Balance Screen   Has the patient fallen in the past 6 months No    Has the patient had a decrease in activity level because of a fear of falling?  Yes    Is the patient reluctant to leave their home because of a fear of falling?  Yes      [place]    Living Arrangements Spouse/significant other    Type of [place] level    [place]- 4 wheels;Cane - single point;Bedside commode;Wheelchair - manual      Prior Function   Level of Independence Independent;Independent with household mobility without device;Independent with community mobility with device    Vocation On disability      Posture/Postural Control   Posture/Postural Control Postural limitations    Postural Limitations Flexed trunk;Weight shift right   crouched posture with flexed hips & knees     ROM / Strength   AROM / PROM / Strength PROM;Strength;AROM      AROM   Overall AROM Comments standing with RW support bil hips ~ -45* ext.  stands with crouched positon Bil. hips & knees      PROM   PROM Assessment Site Knee;Hip    Right/Left Hip Left;Right    Right/Left Knee Right;Left    Right Knee Extension -27   seated   Left Knee Extension -26   seated     Strength   Overall Strength Comments gross & functional strength bil. hip flex, ext & abd 3-/5,  knee ext 3-/5      Transfers   Transfers Sit to Stand;Stand to Sit    Sit to Stand 4: Min assist;With upper extremity assist;With armrests;From chair/3-in-1;From elevated surface;Other (comment)   requires RW support for stabilization   Stand to Sit 4: Min guard;With upper extremity assist;With armrests;To chair/3-in-1;To elevated surface;Other (comment)   requires external support of RW     Ambulation/Gait   Ambulation/Gait Yes    Ambulation/Gait  Assistance 3: Mod assist    Ambulation/Gait Assistance Details excessive BUE weight bearing on RW, partial weight on prosthesis    Ambulation Distance (Feet) 10 Feet    Assistive device Rolling walker;Prosthesis    Gait Pattern Step-to pattern;Decreased stance time - left;Decreased step length - right;Decreased weight shift to left;Left circumduction;Left hip hike;Right flexed knee in stance;Left flexed knee in stance;Antalgic;Lateral hip instability;Trunk flexed;Poor foot clearance - right    Ambulation Surface Level;Indoor      Balance   Balance Assessed Yes      Static Standing Balance   Static Standing - Balance Support Bilateral upper extremity supported    Static  Standing - Level of Assistance 4: Min assist   once PT positioned with weight over feet   Static Standing - Comment/# of Minutes 1      Dynamic Standing Balance   Dynamic Standing - Balance Support Bilateral upper extremity supported;Right upper extremity supported;Left upper extremity supported   RW support   Dynamic Standing - Level of Assistance 3: Mod assist;4: Min assist   modA reaching and minA head turns to scan   Dynamic Standing - Comments reaches with either UE just anterior to RW (within length of his arm).  Scans right/left and up/down with head movements only (no weight shift noted)             Prosthetics Assessment - 07/02/21 1434       Prosthetics   Prosthetic Care Dependent with Skin check;Residual limb care;Prosthetic cleaning;Correct ply sock adjustment;Proper wear schedule/adjustment;Proper weight-bearing schedule/adjustment    Donning prosthesis  Min assist   maximal cues   Doffing prosthesis  Mod assist    Current prosthetic wear tolerance (days/week)  reports wear 1-2 times in last month    Current prosthetic wear tolerance (#hours/day)  up to 1 hour    Current prosthetic weight-bearing tolerance (hours/day)  Pt reports no limb pain with partial weight on prosthesis in stance or gait.    Edema  none    Residual limb condition  no open areas, scar adhered to distal tibia, bony conical shape,    Prosthesis Description interface is silicon liner with no pin in interface, suction pin lock in flexible inner socket imbedded into suction sleeve, PTB socket with supracondylar design,                       Objective measurements completed on examination: See above findings.       [place] Adult PT Treatment/Exercise - 07/02/21 1434       Prosthetics   Prosthetic Care Comments  PT instructed in donning technique and doffing with boot jack to assist.  PT recommended daily wear up to 2hrs 2x/day but cautioned not to wear if someone not available to help doffe until he is independent.    Education Provided Proper Donning;Proper Doffing;Proper wear schedule/adjustment;Other (comment)   see prosthetic care comments   Person(s) Educated Patient;Spouse    Education Method Explanation;Demonstration;Tactile cues;Verbal cues    Education Method Verbalized understanding;Tactile cues required;Verbal cues required;Needs further instruction                       PT Short Term Goals - 07/02/21 1702       PT SHORT TERM GOAL #1   Title Patient able to donne & doffe prosthesis with verbal cues only.    Time 4    Period Weeks    Status New    Target Date 08/01/21      PT SHORT TERM GOAL #2   Title Sit to/from stand chair with armrests to RW with min guard.    Time 4    Period Weeks    Status New    Target Date 08/01/21      PT SHORT TERM GOAL #3   Title Patient ambulates 79' with RW & prosthesis with minA.    Time 4    Period Weeks    Status New    Target Date 08/01/21               PT Long Term Goals - 07/02/21 1700  PT LONG TERM GOAL #1   Title Patient & wife demonstrate and verbalize understanding of proper prosthetic care to enable safe utililzation of prosthesis.    Time 3    Period Months    Status New    Target Date 09/26/21      PT LONG  TERM GOAL #2   Title Patient tolerates prosthesis wear >50% of awake hours without skin or limb pain issues to enable function throughout his day.    Time 3    Period Months    Status New    Target Date 09/26/21      PT LONG TERM GOAL #3   Title Patient able to sit to/from stand from chairs without armrests using UEs to RW and stand pivot transfers with RW modified independent.    Time 3    Period Months    Status New    Target Date 09/26/21      PT LONG TERM GOAL #4   Title Patient ambulates around furniture 57' with RW with supervision for household mobility.    Time 3    Period Months    Status New    Target Date 09/26/21                    Plan - 07/02/21 1652     Clinical Impression Statement This 65yo male was referred to PT for prosthetic training.  He was progressing with function with his prosthesis except pain in residual limb limited him significantly.  He got new socket that he reports does not hurt his limb however he has worn it very limited time. He has flexion contractures in BLEs.  He has significant weakness which is even worse than when this PT saw him 5 months ago.  Patient plans to go back to [place] clinic for reevaluation.  He is dependent in sit to/from stand transfers and standing balance even with BUE support on RW.  His prosthetic gait with RW requires assistance and has gait deviations impairing safety.  He would benefit from skilled PT to improve his function & safety.    Personal Factors and Comorbidities Comorbidity 3+;Time since onset of injury/illness/exacerbation;Fitness    Comorbidities left TTA, ALS ~64yr with rt side weakness > lt side, HTN,    Examination-Activity Limitations Locomotion Level;Stand;Stairs;Transfers    Examination-Participation Restrictions Community Activity    Stability/Clinical Decision Making Evolving/Moderate complexity    Clinical Decision Making Moderate    Rehab Potential Good    PT Frequency 2x / week    PT  Duration 12 weeks    PT Treatment/Interventions ADLs/Self Care Home Management;DME Instruction;Gait training;Stair training;Functional mobility training;Therapeutic activities;Therapeutic exercise;Balance training;Neuromuscular re-education;Patient/family education;Prosthetic Training;Manual techniques;Passive range of motion;Scar mobilization    PT Next Visit Plan review prosthetic care working on donning & doffing,  HEP focus on flexibility & strength, standing balance & gait with prosthesis    Consulted and Agree with Plan of Care Patient;Family member/caregiver    Family Member Consulted wife, RKarman Veney            Patient will benefit from skilled therapeutic intervention in order to improve the following deficits and impairments:  Abnormal gait, Decreased activity tolerance, Decreased balance, Decreased endurance, Decreased knowledge of use of DME, Decreased mobility, Decreased range of motion, Decreased scar mobility, Decreased strength, Difficulty walking, Impaired flexibility, Postural dysfunction, Prosthetic Dependency  Visit Diagnosis: Muscle weakness (generalized)  Unsteadiness on feet  Other abnormalities of gait and mobility  Stiffness of right  knee, not elsewhere classified  Stiffness of left knee, not elsewhere classified  Abnormal posture     Problem List Patient Active Problem List   Diagnosis Date Noted   History of left below knee amputation ([place]) 06/04/2020   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 [place], [place], [address] Phone: [phone]   Fax:  [phone]  Name: Bruce Deleon MRN: [id number] Date of Birth: [birth year]

## 2021-07-17 ENCOUNTER — Encounter: Payer: Medicare Other | Admitting: Physical Therapy

## 2021-07-22 ENCOUNTER — Encounter: Payer: Medicare Other | Admitting: Physical Therapy

## 2021-07-24 ENCOUNTER — Encounter: Payer: Medicare Other | Admitting: Physical Therapy

## 2021-07-30 ENCOUNTER — Encounter: Payer: Medicare Other | Admitting: Physical Therapy

## 2021-08-01 ENCOUNTER — Encounter: Payer: Medicare Other | Admitting: Physical Therapy

## 2021-08-06 ENCOUNTER — Encounter: Payer: Medicare Other | Admitting: Physical Therapy

## 2021-08-07 ENCOUNTER — Encounter: Payer: Medicare Other | Admitting: Physical Therapy

## 2021-08-12 ENCOUNTER — Encounter: Payer: Medicare Other | Admitting: Physical Therapy

## 2021-08-14 ENCOUNTER — Encounter: Payer: Medicare Other | Admitting: Physical Therapy

## 2021-08-20 ENCOUNTER — Encounter: Payer: Medicare Other | Admitting: Physical Therapy

## 2021-08-21 ENCOUNTER — Encounter: Payer: Medicare Other | Admitting: Physical Therapy

## 2023-01-05 ENCOUNTER — Encounter: Payer: Self-pay | Admitting: Physician Assistant

## 2023-01-05 ENCOUNTER — Ambulatory Visit: Payer: Medicare Other | Admitting: Physician Assistant

## 2023-01-05 ENCOUNTER — Other Ambulatory Visit (INDEPENDENT_AMBULATORY_CARE_PROVIDER_SITE_OTHER): Payer: Medicare Other

## 2023-01-05 DIAGNOSIS — Z89512 Acquired absence of left leg below knee: Secondary | ICD-10-CM

## 2023-01-05 MED ORDER — DOXYCYCLINE HYCLATE 100 MG PO TABS: 100.0000 mg | ORAL_TABLET | Freq: Two times a day (BID) | ORAL | 0 refills | Status: DC

## 2023-01-05 NOTE — Progress Notes (Signed)
Office Visit Note   Patient: Bruce Deleon           Date of Birth: 1956/04/09           MRN: [id number] Visit Date: 01/05/2023              Requested by: No referring provider defined for this encounter. PCP: Pcp, No   Assessment & Plan: Visit Diagnoses:  1. History of left below knee amputation (HCC)     Plan: He will wash the incision area with Dial soap daily try it and then placed the shrinker that was supplied to him today on the leg.  He will remove this before going to bed at night.  He is also placed on doxycycline.  Will work on setting him up for possible new prosthesis for the left leg in the near future.  Questions were encouraged and answered at length.  Will see him back in 1 week for wound check.  Follow-Up Instructions: Return in about 1 week (around 01/12/2023) for wound check.   Orders:  Orders Placed This Encounter  Procedures   XR Knee 1-2 Views Left   Meds ordered this encounter  Medications   doxycycline (VIBRA-TABS) 100 MG tablet    Sig: Take 1 tablet (100 mg total) by mouth 2 (two) times daily for 14 days.    Dispense:  28 tablet    Refill:  0      Procedures: No procedures performed   Clinical Data: No additional findings.   Subjective: Chief Complaint  Patient presents with   Left Knee - Pain    HPI Patient 67 year old male comes in today due to drainage at the incision line from his below-knee amputation left leg that was performed by Dr. Raye Sorrow on 08/06/2018.  He states he has had drainage and pain for the last 2 weeks.  States that the drainage can be clear milky white.  He has had no fevers or chills.  He has not worn a prosthesis in over 6 months he states that the last prosthesis that was made did not fit him well.  He is currently not seen his primary care physician is asking for referral to a new physician.  Patient does have a history of hypertension and is no longer taking his hypertension medication or any other medications due to  the fact that he does not see his primary care doctor.  Again he has ALS, chronic respiratory insufficiency and a history of osteomyelitis. Review of Systems   Objective: Vital Signs: There were no vitals taken for this visit.  Physical Exam Constitutional:      Appearance: He is underweight. He is not ill-appearing, toxic-appearing or diaphoretic.  Neurological:     Mental Status: He is alert.     Ortho Exam Left leg: Has slight flexion contracture at the knee.  No abnormal warmth erythema.  There is scab over the incision line from the below-knee amputation this is removed.  There is small pinhole no expressible drainage.  No malodor.  There is no tunneling. Specialty Comments:  No specialty comments available.  Imaging: XR Knee 1-2 Views Left  Result Date: 01/05/2023 Left knee 2 views: No acute findings.  No acute fracture.  No evidence of osteomyelitis.    PMFS History: Patient Active Problem List   Diagnosis Date Noted   History of left below knee amputation (HCC) 06/04/2020   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   Moderate protein-calorie malnutrition (  HCC) 04/13/2019   Leg wound, left, subsequent encounter 10/20/2018   S/P BKA (below knee amputation) unilateral, left (HCC) 08/26/2018   Osteomyelitis (HCC) 08/01/2018   Left foot and ankle infection 08/01/2018   Cellulitis of left foot    Chronic respiratory insufficiency 06/12/2014   ALS (amyotrophic lateral sclerosis) (HCC) 01/25/2013   Essential hypertension 01/25/2013   Past Medical History:  Diagnosis Date   ALS (amyotrophic lateral sclerosis) (HCC) 01/25/2013   ALS (amyotrophic lateral sclerosis) (HCC)    Chronic respiratory insufficiency 06/12/2014   Essential hypertension 01/25/2013    History reviewed. No pertinent family history.  Past Surgical History:  Procedure Laterality Date   AMPUTATION Left 08/06/2018   Procedure: LEFT BELOW KNEE AMPUTATION;  Surgeon: Kathryne Hitch, MD;  [facility];  Service: Orthopedics;  Laterality: Left;   APPLICATION OF WOUND VAC Left 08/04/2018   Procedure: APPLICATION OF WOUND VAC;  Surgeon: Kathryne Hitch, MD;  [facility];  Service: Orthopedics;  Laterality: Left;   I & D EXTREMITY Left 08/01/2018   Procedure: IRRIGATION AND DEBRIDEMENT foot and ankle amputation first and second rays;  Surgeon: Kathryne Hitch, MD;  [facility];  Service: Orthopedics;  Laterality: Left;   I & D EXTREMITY Left 08/04/2018   Procedure: REPEAT IRRIGATION AND DEBRIDEMENT OF LEFT ANKLE;  Surgeon: Kathryne Hitch, MD;  [facility];  Service: Orthopedics;  Laterality: Left;   Social History   Occupational History   Not on file  Tobacco Use   Smoking status: Former   Smokeless tobacco: Never  Substance and Sexual Activity   Alcohol use: Not on file   Drug use: Not on file   Sexual activity: Not on file

## 2023-01-06 ENCOUNTER — Telehealth: Payer: Self-pay | Admitting: Physician Assistant

## 2023-01-06 ENCOUNTER — Other Ambulatory Visit: Payer: Self-pay

## 2023-01-06 MED ORDER — DOXYCYCLINE HYCLATE 100 MG PO TABS: 100.0000 mg | ORAL_TABLET | Freq: Two times a day (BID) | ORAL | 0 refills | Status: AC

## 2023-01-06 NOTE — Telephone Encounter (Signed)
[name] wife Luster Landsberg called requesting for pt script to go to [facility]. Please call pt when sent at [phone].

## 2023-01-12 ENCOUNTER — Ambulatory Visit: Payer: Medicare Other | Admitting: Physician Assistant

## 2023-01-12 ENCOUNTER — Encounter: Payer: Self-pay | Admitting: Physician Assistant

## 2023-01-12 DIAGNOSIS — Z89512 Acquired absence of left leg below knee: Secondary | ICD-10-CM | POA: Diagnosis not present

## 2023-01-12 NOTE — Progress Notes (Signed)
HPI: Bruce Deleon returns today for evaluation of the left below-knee incision.  He has been using a Duda  sleeve, washing the with an antibacterial soap.  He is also been using doxycycline.  Left below-knee amputation site: Healing well.  There is no signs of infection.  The left knee has flexion contracture of.  Impression: Status post left below-knee amputation 08/06/2018  Plan: He is given a prescription to be seen at [facility].  Chris with [facility] one of the orthotist just happened to be in the office today.  He did have a change to look at the patient today and said that he would see him in the [place] clinic to work on getting him fitted with a left leg prosthesis.  Patient was told to bring his old prosthesis with them.  He was given a prescription for [facility] to evaluate and treat him today.  He will follow-up with [place] as needed.  Questions encouraged and answered

## 2023-01-27 ENCOUNTER — Encounter: Payer: Self-pay | Admitting: Family Medicine

## 2023-01-27 ENCOUNTER — Ambulatory Visit: Payer: Medicare Other | Admitting: Family Medicine

## 2023-01-27 VITALS — BP 160/84 | HR 84 | Ht 72.0 in | Wt 158.0 lb

## 2023-01-27 DIAGNOSIS — T8789 Other complications of amputation stump: Secondary | ICD-10-CM | POA: Diagnosis not present

## 2023-01-27 DIAGNOSIS — Z89512 Acquired absence of left leg below knee: Secondary | ICD-10-CM

## 2023-01-27 DIAGNOSIS — G1221 Amyotrophic lateral sclerosis: Secondary | ICD-10-CM

## 2023-01-27 DIAGNOSIS — I10 Essential (primary) hypertension: Secondary | ICD-10-CM | POA: Diagnosis not present

## 2023-01-27 DIAGNOSIS — M79609 Pain in unspecified limb: Secondary | ICD-10-CM

## 2023-01-27 MED ORDER — AMLODIPINE-VALSARTAN-HCTZ 5-160-12.5 MG PO TABS
1.0000 | ORAL_TABLET | Freq: Every day | ORAL | 0 refills | Status: DC
Start: 2023-01-27 — End: 2023-04-27

## 2023-01-27 NOTE — Progress Notes (Signed)
Assessment/Plan:   Problem List Items Addressed This Visit       Cardiovascular and Mediastinum   Essential hypertension    Hypertension: Uncontrolled with systolic readings in the 160s-180s. No current antihypertensive medication use. No symptoms of hypertensive urgency/emergency. -Start Exforge (amlodipine/valsartan/HCTZ) 5/160/12.5mg  daily. -Order routine blood work including kidney function, cholesterol, thyroid function, and blood sugar. -Check blood pressure at home and keep a log. -Follow up in 2 weeks for blood pressure check.       Relevant Medications   amLODIPine-Valsartan-HCTZ (EXFORGE HCT) 5-160-12.5 MG TABS     Nervous and Auditory   ALS (amyotrophic lateral sclerosis) (HCC)    Amyotrophic Lateral Sclerosis (ALS): Established diagnosis, currently followed by Dr. Zannie Kehr at the ALS clinic in [place]. -Continue current management plan with Dr. Zannie Kehr.  General Health Maintenance:  -Offered Hepatitis C and HIV screening. Patient declined.  Follow-up: -Return in 2 weeks for blood pressure check and review of lab results.              Other   History of below-knee amputation of left lower extremity (HCC) - Primary   Amputation stump pain (HCC)    Chronic Pain: Persistent pain in the area of previous amputation. Described as burning, likely neuropathic in nature. -Consider over-the-counter lidocaine patches or topical Voltaren gel as needed for pain.       Medications Discontinued During This Encounter  Medication Reason   amLODipine (NORVASC) 5 MG tablet     Return in about 2 weeks (around 02/10/2023) for BP.    Subjective:   Encounter date: 01/27/2023  Bruce Deleon is a 67 y.o. male who has Osteomyelitis (HCC); Left foot and ankle infection; Cellulitis of left foot; ALS (amyotrophic lateral sclerosis) (HCC); Chronic respiratory insufficiency; Essential hypertension; S/P BKA (below knee amputation) unilateral, left (HCC); Leg wound, left,  subsequent encounter; Phantom pain after amputation of lower extremity (HCC); Moderate protein-calorie malnutrition (HCC); History of below-knee amputation of left lower extremity (HCC); and Amputation stump pain (HCC) on their problem list..   He  has a past medical history of ALS (amyotrophic lateral sclerosis) (HCC) (01/25/2013), ALS (amyotrophic lateral sclerosis) (HCC), Chronic respiratory insufficiency (06/12/2014), and Essential hypertension (01/25/2013).Marland Kitchen   He presents with chief complaint of Establish Care (Refills ) .   Discussed the use of AI scribe software for clinical note transcription with the patient, who gave verbal consent to proceed.  History of Present Illness   Bruce Deleon, a patient with a history of ALS and hypertension, presents to establish care and refill albuterol. He is currently followed by Dr. Zannie Kehr at the ALS clinic in [place]. He recently saw orthopedics for evaluation of his left below-knee amputation and is scheduled to receive a new prosthesis.  The patient's blood pressure has been running high, with readings around 188/84, repeat 160/82, but he denies any associated symptoms such as chest pain, shortness of breath, headaches, blurry vision, or leg swelling. He has been off his blood pressure medication, amlodipine, for several years and does not regularly check his blood pressure at home. He denies any history of stroke or heart disease.  Bruce Deleon also reports persistent, intense pain in the bone of his amputated limb, describing it as a burning sensation localized to one area. This pain is particularly bothersome at night. His amputation was performed several years ago, and he has some scab formation at the site but no open ulcers.  The patient is primarily using a wheelchair until he receives his new  prosthesis. He is followed by [employer] for his prosthesis needs. He has no significant nutritional issues and maintains a healthy diet. He has not had any recent  routine blood work.           01/27/2023    1:38 PM 09/29/2018    2:52 PM  Depression screen PHQ 2/9  Decreased Interest 0 0  Down, Depressed, Hopeless 0 0  PHQ - 2 Score 0 0  Altered sleeping 3   Tired, decreased energy 0   Change in appetite 0   Feeling bad or failure about yourself  0   Trouble concentrating 0   Moving slowly or fidgety/restless 0   Suicidal thoughts 0   PHQ-9 Score 3   Difficult doing work/chores Not difficult at all       01/27/2023    1:38 PM  GAD 7 : Generalized Anxiety Score  Nervous, Anxious, on Edge 0  Control/stop worrying 0  Worry too much - different things 0  Trouble relaxing 3  Restless 0  Easily annoyed or irritable 0  Afraid - awful might happen 0  Total GAD 7 Score 3  Anxiety Difficulty Not difficult at all      Review of Systems  Constitutional:  Negative for chills, diaphoresis, fever, malaise/fatigue and weight loss.  HENT:  Negative for congestion, ear discharge, ear pain and hearing loss.   Eyes:  Negative for blurred vision, double vision, photophobia, pain, discharge and redness.  Respiratory:  Negative for cough, sputum production, shortness of breath and wheezing.   Cardiovascular:  Negative for chest pain and palpitations.  Gastrointestinal:  Negative for abdominal pain, blood in stool, constipation, diarrhea, heartburn, melena, nausea and vomiting.  Genitourinary:  Negative for dysuria, flank pain, frequency, hematuria and urgency.  Musculoskeletal:  Positive for joint pain. Negative for myalgias.  Skin:  Negative for itching and rash.  Neurological:  Positive for weakness. Negative for dizziness, tingling, tremors, speech change, seizures, loss of consciousness and headaches.  Psychiatric/Behavioral:  Negative for depression, hallucinations, memory loss, substance abuse and suicidal ideas. The patient does not have insomnia.   All other systems reviewed and are negative.   Past Surgical History:  Procedure Laterality  Date   AMPUTATION Left 08/06/2018   Procedure: LEFT BELOW KNEE AMPUTATION;  Surgeon: Kathryne Hitch, MD;  Location: [facility];  Service: Orthopedics;  Laterality: Left;   APPLICATION OF WOUND VAC Left 08/04/2018   Procedure: APPLICATION OF WOUND VAC;  Surgeon: Kathryne Hitch, MD;  Location: [facility];  Service: Orthopedics;  Laterality: Left;   I & D EXTREMITY Left 08/01/2018   Procedure: IRRIGATION AND DEBRIDEMENT foot and ankle amputation first and second rays;  Surgeon: Kathryne Hitch, MD;  Location: [facility];  Service: Orthopedics;  Laterality: Left;   I & D EXTREMITY Left 08/04/2018   Procedure: REPEAT IRRIGATION AND DEBRIDEMENT OF LEFT ANKLE;  Surgeon: Kathryne Hitch, MD;  Location: [facility];  Service: Orthopedics;  Laterality: Left;    Outpatient Medications Prior to Visit  Medication Sig Dispense Refill   albuterol (VENTOLIN HFA) 108 (90 Base) MCG/ACT inhaler Inhale 1 puff into the lungs every 4 (four) hours as needed for wheezing or shortness of breath.     amLODipine (NORVASC) 5 MG tablet TAKE 1 TABLET BY MOUTH EVERY DAY 30 tablet 0   No facility-administered medications prior to visit.    No family history on file.  Social History   Socioeconomic History   Marital status: Married  Spouse name: Not on file   Number of children: Not on file   Years of education: Not on file   Highest education level: Not on file  Occupational History   Not on file  Tobacco Use   Smoking status: Some Days    Types: Cigarettes   Smokeless tobacco: Never  Substance and Sexual Activity   Alcohol use: Not on file   Drug use: Not on file   Sexual activity: Not on file  Other Topics Concern   Not on file  Social History Narrative   Not on file   Social Determinants of Health   Financial Resource Strain: Not on file  Food Insecurity: Not on file  Transportation Needs: Not on file  Physical Activity: Not on file  Stress: Not on file  Social Connections: Not  on file  Intimate Partner Violence: Not on file                                                                                                  Objective:  Physical Exam: BP (!) 160/84 (BP Location: Left Arm, Patient Position: Sitting, Cuff Size: Large)   Pulse 84   Ht 6' (1.829 m)   Wt 158 lb (71.7 kg)   SpO2 96%   BMI 21.43 kg/m    Physical Exam Constitutional:      Appearance: Normal appearance.  HENT:     Head: Normocephalic and atraumatic.     Right Ear: Hearing normal.     Left Ear: Hearing normal.     Nose: Nose normal.  Eyes:     General: No scleral icterus.       Right eye: No discharge.        Left eye: No discharge.     Extraocular Movements: Extraocular movements intact.  Cardiovascular:     Rate and Rhythm: Normal rate and regular rhythm.     Heart sounds: Normal heart sounds.  Pulmonary:     Effort: Pulmonary effort is normal.     Breath sounds: Normal breath sounds.  Abdominal:     Palpations: Abdomen is soft.     Tenderness: There is no abdominal tenderness.  Musculoskeletal:     Left Lower Extremity: Left leg is amputated below knee.  Feet:     Left foot:     Skin integrity: No ulcer or skin breakdown.  Skin:    General: Skin is warm.     Findings: No rash.  Neurological:     General: No focal deficit present.     Mental Status: He is alert.     Gait: Gait abnormal (uses wheel chair).  Psychiatric:        Mood and Affect: Mood normal.        Behavior: Behavior normal.        Thought Content: Thought content normal.        Judgment: Judgment normal.     XR Knee 1-2 Views Left  Result Date: 01/05/2023 Left knee 2 views: No acute findings.  No acute fracture.  No evidence of osteomyelitis.   No results  found for this or any previous visit (from the past 2160 hour(s)).      Garner Nash, MD, MS

## 2023-01-27 NOTE — Assessment & Plan Note (Addendum)
Bruce Deleon (ALS): Established diagnosis, currently followed by Dr. Zannie Kehr at the ALS clinic in [place]. -Continue current management plan with Dr. Zannie Kehr.  General Health Maintenance:  -Offered Hepatitis C and HIV screening. Patient declined.  Follow-up: -Return in 2 weeks for blood pressure check and review of lab results.

## 2023-01-27 NOTE — Assessment & Plan Note (Addendum)
Hypertension: Uncontrolled with systolic readings in the 160s-180s. No current antihypertensive medication use. No symptoms of hypertensive urgency/emergency. -Start Exforge (amlodipine/valsartan/HCTZ) 5/160/12.5mg  daily. -Order routine blood work including kidney function, cholesterol, thyroid function, and blood sugar. -Check blood pressure at home and keep a log. -Follow up in 2 weeks for blood pressure check.

## 2023-01-27 NOTE — Assessment & Plan Note (Signed)
Chronic Pain: Persistent pain in the area of previous amputation. Described as burning, likely neuropathic in nature. -Consider over-the-counter lidocaine patches or topical Voltaren gel as needed for pain.

## 2023-01-27 NOTE — Patient Instructions (Signed)
Medication: Start taking Exforge (amlodipine, valsartan, HCTZ) 5/160/4.5 mg once daily to manage your high blood pressure. Blood Pressure Monitoring: Check your blood pressure at home daily and keep a detailed log of your readings. Follow-Up: Schedule a follow-up appointment in 2 weeks to recheck your blood pressure and review your progress. Blood Work: Get routine blood work done, including kidney function, cholesterol levels, thyroid function, and optional Hepatitis C screening. Pain Management: You may try over-the-counter lidocaine patches or a topical pain relief cream for your persistent neuropathic pain.

## 2023-02-04 ENCOUNTER — Encounter: Payer: Self-pay | Admitting: Family Medicine

## 2023-02-04 DIAGNOSIS — Z89512 Acquired absence of left leg below knee: Secondary | ICD-10-CM

## 2023-02-16 ENCOUNTER — Ambulatory Visit: Payer: Medicare Other | Admitting: Family Medicine

## 2023-02-21 ENCOUNTER — Ambulatory Visit
Admission: RE | Admit: 2023-02-21 | Discharge: 2023-02-21 | Disposition: A | Discharge status: Home / Self Care | Payer: Medicare Other | Source: Ambulatory Visit | Attending: Orthopaedic Surgery | Admitting: Orthopaedic Surgery

## 2023-02-21 DIAGNOSIS — Z89512 Acquired absence of left leg below knee: Secondary | ICD-10-CM

## 2023-03-02 NOTE — Telephone Encounter (Signed)
Ok I just called for report to be read

## 2023-03-03 ENCOUNTER — Telehealth: Payer: Self-pay | Admitting: Orthopaedic Surgery

## 2023-03-03 NOTE — Telephone Encounter (Signed)
Patient's wife Bruce Deleon called to schedule an appointment with Dr. Steward Drone. Renee asked if patient can be worked in sooner if possible? Renee advised patient is in so much pain in his left knee and left stump. The number to contact Bruce Deleon is

## 2023-03-25 ENCOUNTER — Ambulatory Visit (HOSPITAL_BASED_OUTPATIENT_CLINIC_OR_DEPARTMENT_OTHER): Payer: Medicare Other | Admitting: Orthopaedic Surgery

## 2023-03-25 ENCOUNTER — Other Ambulatory Visit (HOSPITAL_BASED_OUTPATIENT_CLINIC_OR_DEPARTMENT_OTHER): Payer: Self-pay

## 2023-03-25 DIAGNOSIS — Z89512 Acquired absence of left leg below knee: Secondary | ICD-10-CM

## 2023-03-25 MED ORDER — TRAMADOL HCL 50 MG PO TABS
50.0000 mg | ORAL_TABLET | Freq: Four times a day (QID) | ORAL | 2 refills | Status: AC | PRN
Start: 2023-03-25 — End: ?
  Filled 2023-03-25: qty 30, 8d supply, fill #0
  Filled 2023-04-07: qty 30, 8d supply, fill #1
  Filled 2023-04-21: qty 30, 8d supply, fill #2

## 2023-03-25 NOTE — Progress Notes (Signed)
Chief Complaint: Left knee pain     History of Present Illness:    Bruce Deleon is a 67 y.o. male presents today with left knee pain in the setting of a previous below-knee amputation by my partner Dr. Allie Bossier 4 years prior.  He states that initially he was doing quite well very hopeful about getting into an prosthesis although unfortunately he has had some wound healing issues and persistent stump pain.  He has been going to the wound care center with some results although he does have several areas of nonhealing about the knee.  He is here today as a referral from Dr. Magnus Ivan to see if he would be a candidate for knee arthroscopy and meniscal debridement with lysis of adhesions    Surgical History:   As above  PMH/PSH/Family History/Social History/Meds/Allergies:    Past Medical History:  Diagnosis Date   ALS (amyotrophic lateral sclerosis) (HCC) 01/25/2013   ALS (amyotrophic lateral sclerosis) (HCC)    Chronic respiratory insufficiency 06/12/2014   Essential hypertension 01/25/2013   Past Surgical History:  Procedure Laterality Date   AMPUTATION Left 08/06/2018   Procedure: LEFT BELOW KNEE AMPUTATION;  Surgeon: Kathryne Hitch, MD;  Location: [facility];  Service: Orthopedics;  Laterality: Left;   APPLICATION OF WOUND VAC Left 08/04/2018   Procedure: APPLICATION OF WOUND VAC;  Surgeon: Kathryne Hitch, MD;  Location: [facility];  Service: Orthopedics;  Laterality: Left;   I & D EXTREMITY Left 08/01/2018   Procedure: IRRIGATION AND DEBRIDEMENT foot and ankle amputation first and second rays;  Surgeon: Kathryne Hitch, MD;  Location: [facility];  Service: Orthopedics;  Laterality: Left;   I & D EXTREMITY Left 08/04/2018   Procedure: REPEAT IRRIGATION AND DEBRIDEMENT OF LEFT ANKLE;  Surgeon: Kathryne Hitch, MD;  Location: [facility];  Service: Orthopedics;  Laterality: Left;   Social History   Socioeconomic History    Marital status: Married    Spouse name: Not on file   Number of children: Not on file   Years of education: Not on file   Highest education level: Not on file  Occupational History   Not on file  Tobacco Use   Smoking status: Some Days    Types: Cigarettes   Smokeless tobacco: Never  Substance and Sexual Activity   Alcohol use: Not on file   Drug use: Not on file   Sexual activity: Not on file  Other Topics Concern   Not on file  Social History Narrative   Not on file   Social Determinants of Health   Financial Resource Strain: Not on file  Food Insecurity: Not on file  Transportation Needs: Not on file  Physical Activity: Not on file  Stress: Not on file  Social Connections: Not on file   No family history on file. No Known Allergies Current Outpatient Medications  Medication Sig Dispense Refill   traMADol (ULTRAM) 50 MG tablet Take 1 tablet (50 mg total) by mouth every 6 (six) hours as needed. 30 tablet 2   albuterol (VENTOLIN HFA) 108 (90 Base) MCG/ACT inhaler Inhale 1 puff into the lungs every 4 (four) hours as needed for wheezing or shortness of breath.     amLODIPine-Valsartan-HCTZ (EXFORGE HCT) 5-160-12.5 MG TABS Take 1 tablet by mouth daily at 12 noon. 30  tablet 0   No current facility-administered medications for this visit.   No results found.  Review of Systems:   A ROS was performed including pertinent positives and negatives as documented in the HPI.  Physical Exam :   Constitutional: NAD and appears stated age Neurological: Alert and oriented Psych: Appropriate affect and cooperative There were no vitals taken for this visit.   Comprehensive Musculoskeletal Exam:    He does have good length on his remaining tibia with intact posterior gastroc flap.  There are several areas of nonhealing around the incision although no evidence of infection with drainage or redness and swelling.  His hamstring tendons are contracted without ability to actively or  passively extend beyond 30 degrees short of full extension.  He does have some mild lateral joint line tenderness  Imaging:   Xray (4 views left knee): Status post below-knee amputation  MRI (left knee): Undersurface lateral meniscal tear  I personally reviewed and interpreted the radiographs.   Assessment:   67 y.o. male with a left knee below-knee amputation done by Dr. Magnus Ivan 4 years prior for rapidly spreading infection.  At today's visit I do believe that his limited extension is likely more related to hamstring contracture as he has been sitting in a wheelchair without a prosthesis now for 4 years.  Given this I did discuss that I believe a lysis of adhesions would be less successful for giving him the ability to achieve full extension.  He is still having some pain around the amputation site as well as nonhealing wounds.  Given this I do believe it would probably be beneficial for him to first consult with Dr. Lajoyce Corners to see if he would be a candidate for shortening the amputation versus a through knee versus an above-knee amputation.  I do think that what ever strategy would be to address his hamstrings as I believe these contractures are quite significantly limiting his ability to go into extension  Plan :    -Plan for initial referral to Dr. Lajoyce Corners for assessment on the amputation and he may follow back up with me if knee arthroscopy is deemed necessary     I personally saw and evaluated the patient, and participated in the management and treatment plan.  Huel Cote, MD Attending Physician, Orthopedic Surgery  This document was dictated using Dragon voice recognition software. A reasonable attempt at proof reading has been made to minimize errors.

## 2023-04-06 ENCOUNTER — Ambulatory Visit: Payer: Medicare Other | Admitting: Orthopedic Surgery

## 2023-04-07 ENCOUNTER — Other Ambulatory Visit: Payer: Self-pay

## 2023-04-20 ENCOUNTER — Encounter: Payer: Self-pay | Admitting: Orthopedic Surgery

## 2023-04-20 ENCOUNTER — Ambulatory Visit: Payer: Medicare Other | Admitting: Orthopedic Surgery

## 2023-04-20 DIAGNOSIS — I70262 Atherosclerosis of native arteries of extremities with gangrene, left leg: Secondary | ICD-10-CM | POA: Diagnosis not present

## 2023-04-20 DIAGNOSIS — Z89512 Acquired absence of left leg below knee: Secondary | ICD-10-CM

## 2023-04-21 ENCOUNTER — Encounter: Payer: Self-pay | Admitting: Orthopedic Surgery

## 2023-04-21 NOTE — Progress Notes (Signed)
Office Visit Note   Patient: Bruce Deleon           Date of Birth: 1956/05/30           MRN: [id number] Visit Date: 04/20/2023              Requested by: Garnette Gunner, MD [address] [place],  [place] [address] PCP: Garnette Gunner, MD  Chief Complaint  Patient presents with   Left Leg - Pain    HX BKA with CB 2020      HPI: Patient is a 67 year old gentleman who is status post left below-knee amputation who is seen for initial evaluation for ischemic pain and ulceration.  Patient complains of constant ischemic pain in his left transtibial amputation.  Past medical history positive for tobacco.  Assessment & Plan: Visit Diagnoses:  1. Atherosclerosis of native arteries of extremities with gangrene, left leg (HCC)   2. History of left below knee amputation (HCC)     Plan: Recommended patient proceed with a left above-the-knee amputation.  With the fixed flexion contracture of the left below-knee amputation and ischemic changes patient is not a candidate for revision of the below-knee amputation..  Patient states he understands wishes to proceed with above-knee amputation he will call when he is ready to proceed with surgery.  Follow-Up Instructions: No follow-ups on file.   Ortho Exam  Patient is alert, oriented, no adenopathy, well-dressed, normal affect, normal respiratory effort. Examination patient has pain to light touch for the left transtibial amputation with ischemic pain there is a fixed flexion contracture of the knee.  Patient is unable to wear his prosthesis secondary to this contracture.  Patient has had an MRI scan which shows ulceration down to bone.  Imaging: No results found. No images are attached to the encounter.  Labs: Lab Results  Component Value Date   REPTSTATUS 08/07/2018 FINAL 08/01/2018   GRAMSTAIN NO ORGANISMS SEEN MODERATE GRAM POSITIVE COCCI  08/01/2018   CULT  08/01/2018    MODERATE STAPHYLOCOCCUS AUREUS NO ANAEROBES  ISOLATED Performed at [facility], [address], [place], [place] [address]    [facility] STAPHYLOCOCCUS AUREUS 08/01/2018     Lab Results  Component Value Date   ALBUMIN 2.1 (L) 08/04/2018   ALBUMIN 2.1 (L) 08/03/2018   ALBUMIN 3.0 (L) 08/01/2018    Lab Results  Component Value Date   MG 2.1 08/06/2018   MG 2.1 08/05/2018   MG 2.4 08/04/2018   No results found for: "VD25OH"  No results found for: "PREALBUMIN"    Latest Ref Rng & Units 08/10/2018    3:55 AM 08/09/2018    3:42 AM 08/08/2018    3:42 AM  CBC EXTENDED  WBC 4.0 - 10.5 K/uL 16.7  14.3  15.3   RBC 4.22 - 5.81 MIL/uL 4.35  4.19  3.94   Hemoglobin 13.0 - 17.0 g/dL 86.5  78.4  69.6   HCT 39.0 - 52.0 % 40.9  39.5  36.7   Platelets 150 - 400 K/uL 381  415  454      There is no height or weight on file to calculate BMI.  Orders:  No orders of the defined types were placed in this encounter.  No orders of the defined types were placed in this encounter.    Procedures: No procedures performed  Clinical Data: No additional findings.  ROS:  All other systems negative, except as noted in the HPI. Review of Systems  Objective: Vital Signs: There were no vitals taken for this visit.  Specialty Comments:  No specialty comments available.  PMFS History: Patient Active Problem List   Diagnosis Date Noted   Amputation stump pain (HCC) 01/27/2023   History of below-knee amputation of left lower extremity (HCC) 06/04/2020   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   Moderate protein-calorie malnutrition (HCC) 04/13/2019   Leg wound, left, subsequent encounter 10/20/2018   S/P BKA (below knee amputation) unilateral, left (HCC) 08/26/2018   Osteomyelitis (HCC) 08/01/2018   Left foot and ankle infection 08/01/2018   Cellulitis of left foot    Chronic respiratory insufficiency 06/12/2014   ALS (amyotrophic lateral sclerosis) (HCC) 01/25/2013   Essential hypertension 01/25/2013    Past Medical History:  Diagnosis Date   ALS (amyotrophic lateral sclerosis) (HCC) 01/25/2013   ALS (amyotrophic lateral sclerosis) (HCC)    Chronic respiratory insufficiency 06/12/2014   Essential hypertension 01/25/2013    History reviewed. No pertinent family history.  Past Surgical History:  Procedure Laterality Date   AMPUTATION Left 08/06/2018   Procedure: LEFT BELOW KNEE AMPUTATION;  Surgeon: Kathryne Hitch, MD;  Location: [facility];  Service: Orthopedics;  Laterality: Left;   APPLICATION OF WOUND VAC Left 08/04/2018   Procedure: APPLICATION OF WOUND VAC;  Surgeon: Kathryne Hitch, MD;  Location: [facility];  Service: Orthopedics;  Laterality: Left;   I & D EXTREMITY Left 08/01/2018   Procedure: IRRIGATION AND DEBRIDEMENT foot and ankle amputation first and second rays;  Surgeon: Kathryne Hitch, MD;  Location: [facility];  Service: Orthopedics;  Laterality: Left;   I & D EXTREMITY Left 08/04/2018   Procedure: REPEAT IRRIGATION AND DEBRIDEMENT OF LEFT ANKLE;  Surgeon: Kathryne Hitch, MD;  Location: [facility];  Service: Orthopedics;  Laterality: Left;   Social History   Occupational History   Not on file  Tobacco Use   Smoking status: Some Days    Types: Cigarettes   Smokeless tobacco: Never  Substance and Sexual Activity   Alcohol use: Not on file   Drug use: Not on file   Sexual activity: Not on file

## 2023-04-22 ENCOUNTER — Other Ambulatory Visit: Payer: Self-pay

## 2023-04-30 ENCOUNTER — Telehealth: Payer: Self-pay | Admitting: Orthopedic Surgery

## 2023-05-05 ENCOUNTER — Encounter (HOSPITAL_COMMUNITY): Payer: Self-pay | Admitting: Orthopedic Surgery

## 2023-05-05 ENCOUNTER — Other Ambulatory Visit: Payer: Self-pay

## 2023-05-05 NOTE — Progress Notes (Signed)
SDW CALL  Patient was given pre-op instructions over the phone. The opportunity was given for the patient to ask questions. No further questions asked. Patient verbalized understanding of instructions given.   PCP - Griffith Citron Cardiologist - denies Neurologist - Dr. [facility]- Pt has not seen him since 2016.  PPM/ICD - denies Device Orders -  Rep Notified -   Chest x-ray -  EKG - DOS Stress Test - denies ECHO - denies Cardiac Cath - denies  Sleep Study - denies CPAP -   Fasting Blood Sugar - na Checks Blood Sugar _____ times a day  Blood Thinner Instructions:na Aspirin Instructions:na  ERAS Protcol -clears until 0630 PRE-SURGERY Ensure or G2- no  COVID TEST- na   Anesthesia review: yes Chronic respiratory insufficiency,ALS,HTN  Patient denies shortness of breath, fever, cough and chest pain over the phone call    Surgical Instructions    Your procedure is scheduled on October 23  Report to [facility] Main Entrance "A" at 0700 A.M., then check in with the Admitting office.  Call this number if you have problems the morning of surgery:  [phone]    Remember:  Do not eat after midnight the night before your surgery  You may drink clear liquids until 0630 the morning of your surgery.   Clear liquids allowed are: Water, Non-Citrus Juices (without pulp), Carbonated Beverages, Clear Tea, Black Coffee ONLY (NO MILK, CREAM OR POWDERED CREAMER of any kind), and Gatorade   Take these medicines the morning of surgery with A SIP OF WATER: Tramadol if needed  As of today, STOP taking any Aspirin (unless otherwise instructed by your surgeon) Aleve, Naproxen, Ibuprofen, Motrin, Advil, Goody's, BC's, all herbal medications, fish oil, and all vitamins.  [facility] is not responsible for any belongings or valuables. .   Do NOT Smoke (Tobacco/Vaping)  24 hours prior to your procedure  If you use a CPAP at night, you may bring your mask for your  overnight stay.   Contacts, glasses, hearing aids, dentures or partials may not be worn into surgery, please bring cases for these belongings   Patients discharged the day of surgery will not be allowed to drive home, and someone needs to stay with them for 24 hours.   Special instructions:    Oral Hygiene is also important to reduce your risk of infection.  Remember - BRUSH YOUR TEETH THE MORNING OF SURGERY WITH YOUR REGULAR TOOTHPASTE   Day of Surgery:  Take a shower the day of or night before with antibacterial soap. Wear Clean/Comfortable clothing the morning of surgery Do not apply any deodorants/lotions.   Do not wear jewelry or makeup Do not wear lotions, powders, perfumes/colognes, or deodorant. Do not shave 48 hours prior to surgery.  Men may shave face and neck. Do not bring valuables to the hospital. Do not wear nail polish, gel polish, artificial nails, or any other type of covering on natural nails (fingers and toes) If you have artificial nails or gel coating that need to be removed by a nail salon, please have this removed prior to surgery. Artificial nails or gel coating may interfere with anesthesia's ability to adequately monitor your vital signs. Remember to brush your teeth WITH YOUR REGULAR TOOTHPASTE.

## 2023-05-05 NOTE — Progress Notes (Signed)
Anesthesia Chart Review: Bruce Deleon  Case: [id number] Date/Time: 05/06/23 1041   Procedure: LEFT ABOVE KNEE AMPUTATION (Left: Knee)   Anesthesia type: Choice   Pre-op diagnosis: Gangrene Left Leg   [facility]: [facility] ROOM 04 / [facility] OR   Surgeons: Nadara Mustard, Bruce Deleon       DISCUSSION: Patient is a 67 year old male scheduled for the above procedure. 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 He has had persistent pain at the amputation site and developed a fixed flexion contracture of the knee and unable to wear a prosthesis. Conversion of left BKA to left AKA planned. Marland Kitchen   History includes smoking, amyotrophic lateral sclerosis (noted progressive right weakness starting in 2009, ALS diagnosis ~ 01/2013), chronic respiratory insufficiency, HTN, asthma, LLE necrotizing fascitis (s/p left BKA 08/07/18)  Neurologist is listed as Dr. Zannie Deleon at the [facility], although last office visit noted was on 11/07/14. He arms and legs were a little weaker then and walking with a walker to reduce falls. Swallowing was slightly worse. Discussed option of non-invasive ventilation (Trilogy) trial. (Care Everywhere note provides additional details about the Trilogy device.) He reported that he had not seen neurology since 2016 and dose not use Trilogy, home O2, or other respiratory assistance. 4.0 LMA used for left BKA on 08/06/18.   He is a same day work-up. Anesthesia team to evaluate on the day of surgery.    VS: Exforge 5/160/12.5 mg daily prescribed 01/27/23 for BP 160/84, although it is not currently listed on his medication list.   BP Readings from Last 3 Encounters:  01/27/23 (!) 160/84  02/02/20 (!) 143/94  04/13/19 (!) 184/108   Pulse Readings from Last 3 Encounters:  01/27/23 84  02/02/20 98  04/13/19 89  .  PROVIDERS: Bruce Gunner, Bruce Deleon Bruce Alanis, Bruce Deleon is documented as his neurologist, although last visit with [facility] was on  11/07/14.   LABS: For day of surgery as indicated.    IMAGES: Xray left knee 01/05/23: Left knee 2 views: No acute findings.  No acute fracture.  No evidence of  osteomyelitis.    EKG: None available for review.    CV: N/A  Past Medical History:  Diagnosis Date   ALS (amyotrophic lateral sclerosis) (HCC) 01/25/2013   Asthma    Chronic respiratory insufficiency 06/12/2014   Essential hypertension 01/25/2013    Past Surgical History:  Procedure Laterality Date   AMPUTATION Left 08/06/2018   Procedure: LEFT BELOW KNEE AMPUTATION;  Surgeon: Bruce Hitch, Bruce Deleon;  Location: [facility];  Service: Orthopedics;  Laterality: Left;   APPLICATION OF WOUND VAC Left 08/04/2018   Procedure: APPLICATION OF WOUND VAC;  Surgeon: Bruce Hitch, Bruce Deleon;  Location: [facility];  Service: Orthopedics;  Laterality: Left;   BACK SURGERY     EYE SURGERY Bilateral 2010   cataract   I & D EXTREMITY Left 08/01/2018   Procedure: IRRIGATION AND DEBRIDEMENT foot and ankle amputation first and second rays;  Surgeon: Bruce Hitch, Bruce Deleon;  Location: [facility];  Service: Orthopedics;  Laterality: Left;   I & D EXTREMITY Left 08/04/2018   Procedure: REPEAT IRRIGATION AND DEBRIDEMENT OF LEFT ANKLE;  Surgeon: Bruce Hitch, Bruce Deleon;  Location: [facility];  Service: Orthopedics;  Laterality: Left;    MEDICATIONS: No current facility-administered medications for this encounter.    traMADol (ULTRAM) 50 MG tablet    Bruce Chock, Bruce Deleon Surgical Short Stay/Anesthesiology [facility] Phone [phone])  [phone] [facility] Phone [phone] 05/05/2023 4:58 PM

## 2023-05-05 NOTE — Anesthesia Preprocedure Evaluation (Signed)
Anesthesia Evaluation  Patient identified by MRN, date of birth, ID band Patient awake    Reviewed: Allergy & Precautions, H&P , NPO status , Patient's Chart, lab work & pertinent test results  Airway Mallampati: II  TM Distance: >3 FB Neck ROM: Full    Dental no notable dental hx.    Pulmonary neg pulmonary ROS, Current Smoker   Pulmonary exam normal breath sounds clear to auscultation       Cardiovascular hypertension, + Peripheral Vascular Disease  Normal cardiovascular exam Rhythm:Regular Rate:Normal     Neuro/Psych ALS  Neuromuscular disease  negative psych ROS   GI/Hepatic negative GI ROS, Neg liver ROS,,,  Endo/Other  negative endocrine ROS    Renal/GU negative Renal ROS  negative genitourinary   Musculoskeletal negative musculoskeletal ROS (+)    Abdominal   Peds negative pediatric ROS (+)  Hematology negative hematology ROS (+)   Anesthesia Other Findings   Reproductive/Obstetrics negative OB ROS                              Anesthesia Physical Anesthesia Plan  ASA: 4  Anesthesia Plan: General   Post-op Pain Management: Ketamine IV*   Induction: Intravenous  PONV Risk Score and Plan: 1 and Ondansetron, Dexamethasone and Treatment may vary due to age or medical condition  Airway Management Planned: LMA  Additional Equipment:   Intra-op Plan:   Post-operative Plan: Extubation in OR  Informed Consent: I have reviewed the patients History and Physical, chart, labs and discussed the procedure including the risks, benefits and alternatives for the proposed anesthesia with the patient or authorized representative who has indicated his/her understanding and acceptance.     Dental advisory given  Plan Discussed with: CRNA and Surgeon  Anesthesia Plan Comments: (PAT note written 05/05/2023 by Shonna Chock, PA-C. History of ALS.    )        Anesthesia Quick  Evaluation

## 2023-05-06 ENCOUNTER — Other Ambulatory Visit: Payer: Self-pay

## 2023-05-06 ENCOUNTER — Encounter (HOSPITAL_COMMUNITY)
Admission: RE | Disposition: A | Discharge status: Home / Self Care | Payer: Self-pay | Source: Home / Self Care | Attending: Orthopedic Surgery

## 2023-05-06 ENCOUNTER — Inpatient Hospital Stay (HOSPITAL_COMMUNITY)
Admission: RE | Admit: 2023-05-06 | Discharge: 2023-05-09 | DRG: 240 | Disposition: A | Discharge status: Home / Self Care | Payer: Medicare Other | Attending: Orthopedic Surgery | Admitting: Orthopedic Surgery

## 2023-05-06 ENCOUNTER — Inpatient Hospital Stay (HOSPITAL_COMMUNITY): Payer: Medicare Other | Admitting: Vascular Surgery

## 2023-05-06 DIAGNOSIS — S78112A Complete traumatic amputation at level between left hip and knee, initial encounter: Secondary | ICD-10-CM

## 2023-05-06 DIAGNOSIS — M24562 Contracture, left knee: Secondary | ICD-10-CM | POA: Diagnosis present

## 2023-05-06 DIAGNOSIS — F17210 Nicotine dependence, cigarettes, uncomplicated: Secondary | ICD-10-CM | POA: Diagnosis present

## 2023-05-06 DIAGNOSIS — J45909 Unspecified asthma, uncomplicated: Secondary | ICD-10-CM | POA: Diagnosis present

## 2023-05-06 DIAGNOSIS — I739 Peripheral vascular disease, unspecified: Secondary | ICD-10-CM | POA: Diagnosis not present

## 2023-05-06 DIAGNOSIS — I10 Essential (primary) hypertension: Secondary | ICD-10-CM | POA: Diagnosis present

## 2023-05-06 DIAGNOSIS — M86262 Subacute osteomyelitis, left tibia and fibula: Secondary | ICD-10-CM | POA: Diagnosis not present

## 2023-05-06 DIAGNOSIS — G1221 Amyotrophic lateral sclerosis: Secondary | ICD-10-CM | POA: Diagnosis present

## 2023-05-06 DIAGNOSIS — Z89612 Acquired absence of left leg above knee: Secondary | ICD-10-CM

## 2023-05-06 DIAGNOSIS — I70262 Atherosclerosis of native arteries of extremities with gangrene, left leg: Secondary | ICD-10-CM | POA: Diagnosis present

## 2023-05-06 DIAGNOSIS — I96 Gangrene, not elsewhere classified: Secondary | ICD-10-CM

## 2023-05-06 DIAGNOSIS — Z01818 Encounter for other preprocedural examination: Principal | ICD-10-CM

## 2023-05-06 HISTORY — DX: Unspecified asthma, uncomplicated: J45.909

## 2023-05-06 HISTORY — PX: AMPUTATION: SHX166

## 2023-05-06 LAB — CBC WITH DIFFERENTIAL/PLATELET
Abs Immature Granulocytes: 0.02 10*3/uL (ref 0.00–0.07)
Basophils Absolute: 0.1 10*3/uL (ref 0.0–0.1)
Basophils Relative: 1 %
Eosinophils Absolute: 0.2 10*3/uL (ref 0.0–0.5)
Eosinophils Relative: 2 %
HCT: 48.9 % (ref 39.0–52.0)
Hemoglobin: 16.2 g/dL (ref 13.0–17.0)
Immature Granulocytes: 0 %
Lymphocytes Relative: 13 %
Lymphs Abs: 0.9 10*3/uL (ref 0.7–4.0)
MCH: 30.9 pg (ref 26.0–34.0)
MCHC: 33.1 g/dL (ref 30.0–36.0)
MCV: 93.1 fL (ref 80.0–100.0)
Monocytes Absolute: 0.6 10*3/uL (ref 0.1–1.0)
Monocytes Relative: 8 %
Neutro Abs: 5.4 10*3/uL (ref 1.7–7.7)
Neutrophils Relative %: 76 %
Platelets: 199 10*3/uL (ref 150–400)
RBC: 5.25 MIL/uL (ref 4.22–5.81)
RDW: 13.2 % (ref 11.5–15.5)
WBC: 7.1 10*3/uL (ref 4.0–10.5)
nRBC: 0 % (ref 0.0–0.2)

## 2023-05-06 LAB — COMPREHENSIVE METABOLIC PANEL
ALT: 19 U/L (ref 0–44)
AST: 20 U/L (ref 15–41)
Albumin: 3.5 g/dL (ref 3.5–5.0)
Alkaline Phosphatase: 63 U/L (ref 38–126)
Anion gap: 10 (ref 5–15)
BUN: 9 mg/dL (ref 8–23)
CO2: 26 mmol/L (ref 22–32)
Calcium: 8.9 mg/dL (ref 8.9–10.3)
Chloride: 98 mmol/L (ref 98–111)
Creatinine, Ser: 0.59 mg/dL — ABNORMAL LOW (ref 0.61–1.24)
GFR, Estimated: >60 mL/min (ref >60)
Glucose, Bld: 98 mg/dL (ref 70–99)
Potassium: 3.9 mmol/L (ref 3.5–5.1)
Sodium: 134 mmol/L — ABNORMAL LOW (ref 135–145)
Total Bilirubin: 0.5 mg/dL (ref 0.3–1.2)
Total Protein: 6.7 g/dL (ref 6.5–8.1)

## 2023-05-06 LAB — GLUCOSE, CAPILLARY
Glucose-Capillary: 121 mg/dL — ABNORMAL HIGH (ref 70–99)
Glucose-Capillary: 108 mg/dL — ABNORMAL HIGH (ref 70–99)

## 2023-05-06 LAB — PREALBUMIN: Prealbumin: 17 mg/dL — ABNORMAL LOW (ref 18–38)

## 2023-05-06 SURGERY — AMPUTATION, ABOVE KNEE
Anesthesia: General | Site: Knee | Laterality: Left

## 2023-05-06 MED ORDER — 0.9 % SODIUM CHLORIDE (POUR BTL) OPTIME
TOPICAL | Status: DC | PRN
Start: 2023-05-06 — End: 2023-05-06
  Administered 2023-05-06: 1000 mL

## 2023-05-06 MED ORDER — PANTOPRAZOLE SODIUM 40 MG PO TBEC
40.0000 mg | DELAYED_RELEASE_TABLET | Freq: Every day | ORAL | Status: DC
Start: 2023-05-06 — End: 2023-05-09
  Administered 2023-05-06 – 2023-05-09 (×4): 40 mg via ORAL
  Filled 2023-05-06 (×4): qty 1

## 2023-05-06 MED ORDER — MAGNESIUM SULFATE 2 GM/50ML IV SOLN: 2.0000 g | Freq: Every day | INTRAVENOUS | Status: DC | PRN

## 2023-05-06 MED ORDER — OXYCODONE HCL 5 MG PO TABS
5.0000 mg | ORAL_TABLET | ORAL | Status: DC | PRN
  Administered 2023-05-06: 5 mg via ORAL
  Filled 2023-05-06: qty 1

## 2023-05-06 MED ORDER — ORAL CARE MOUTH RINSE: 15.0000 mL | Freq: Once | OROMUCOSAL | Status: AC

## 2023-05-06 MED ORDER — ZINC SULFATE 220 (50 ZN) MG PO CAPS
220.0000 mg | ORAL_CAPSULE | Freq: Every day | ORAL | Status: DC
  Administered 2023-05-06 – 2023-05-09 (×4): 220 mg via ORAL
  Filled 2023-05-06 (×4): qty 1

## 2023-05-06 MED ORDER — METOPROLOL TARTRATE 5 MG/5ML IV SOLN: 2.0000 mg | INTRAVENOUS | Status: DC | PRN

## 2023-05-06 MED ORDER — ACETAMINOPHEN 10 MG/ML IV SOLN
1000.0000 mg | Freq: Once | INTRAVENOUS | Status: AC
  Administered 2023-05-06: 1000 mg via INTRAVENOUS

## 2023-05-06 MED ORDER — HYDROMORPHONE HCL 1 MG/ML IJ SOLN
0.2500 mg | INTRAMUSCULAR | Status: DC | PRN
  Administered 2023-05-06 (×4): 0.5 mg via INTRAVENOUS

## 2023-05-06 MED ORDER — DOCUSATE SODIUM 100 MG PO CAPS
100.0000 mg | ORAL_CAPSULE | Freq: Every day | ORAL | Status: DC
  Administered 2023-05-07 – 2023-05-09 (×3): 100 mg via ORAL
  Filled 2023-05-06 (×3): qty 1

## 2023-05-06 MED ORDER — ONDANSETRON HCL 4 MG/2ML IJ SOLN
INTRAMUSCULAR | Status: AC
  Filled 2023-05-06: qty 2

## 2023-05-06 MED ORDER — SODIUM CHLORIDE 0.9 % IV SOLN: INTRAVENOUS | Status: DC

## 2023-05-06 MED ORDER — HYDRALAZINE HCL 20 MG/ML IJ SOLN: 5.0000 mg | INTRAMUSCULAR | Status: DC | PRN

## 2023-05-06 MED ORDER — CEFAZOLIN SODIUM-DEXTROSE 2-4 GM/100ML-% IV SOLN
2.0000 g | INTRAVENOUS | Status: AC
  Administered 2023-05-06: 2 g via INTRAVENOUS
  Filled 2023-05-06: qty 100

## 2023-05-06 MED ORDER — FENTANYL CITRATE (PF) 250 MCG/5ML IJ SOLN
INTRAMUSCULAR | Status: DC | PRN
  Administered 2023-05-06: 50 ug via INTRAVENOUS
  Administered 2023-05-06: 100 ug via INTRAVENOUS

## 2023-05-06 MED ORDER — PHENYLEPHRINE 80 MCG/ML (10ML) SYRINGE FOR IV PUSH (FOR BLOOD PRESSURE SUPPORT)
PREFILLED_SYRINGE | INTRAVENOUS | Status: DC | PRN
  Administered 2023-05-06: 240 ug via INTRAVENOUS

## 2023-05-06 MED ORDER — CEFAZOLIN SODIUM-DEXTROSE 2-4 GM/100ML-% IV SOLN
2.0000 g | Freq: Three times a day (TID) | INTRAVENOUS | Status: AC
  Administered 2023-05-06 – 2023-05-07 (×2): 2 g via INTRAVENOUS
  Filled 2023-05-06 (×2): qty 100

## 2023-05-06 MED ORDER — CHLORHEXIDINE GLUCONATE 0.12 % MT SOLN
15.0000 mL | Freq: Once | OROMUCOSAL | Status: AC
  Administered 2023-05-06: 15 mL via OROMUCOSAL
  Filled 2023-05-06: qty 15

## 2023-05-06 MED ORDER — LABETALOL HCL 5 MG/ML IV SOLN
10.0000 mg | INTRAVENOUS | Status: DC | PRN
  Administered 2023-05-06 – 2023-05-07 (×2): 10 mg via INTRAVENOUS
  Filled 2023-05-06 (×2): qty 4

## 2023-05-06 MED ORDER — MAGNESIUM CITRATE PO SOLN: 1.0000 | Freq: Once | ORAL | Status: DC | PRN

## 2023-05-06 MED ORDER — OXYCODONE HCL 5 MG PO TABS
5.0000 mg | ORAL_TABLET | Freq: Once | ORAL | Status: AC | PRN
  Administered 2023-05-06: 5 mg via ORAL

## 2023-05-06 MED ORDER — OXYCODONE HCL 5 MG PO TABS
ORAL_TABLET | ORAL | Status: AC
  Filled 2023-05-06: qty 1

## 2023-05-06 MED ORDER — ACETAMINOPHEN 325 MG PO TABS: 325.0000 mg | ORAL_TABLET | Freq: Four times a day (QID) | ORAL | Status: DC | PRN

## 2023-05-06 MED ORDER — POTASSIUM CHLORIDE CRYS ER 20 MEQ PO TBCR: 20.0000 meq | EXTENDED_RELEASE_TABLET | Freq: Every day | ORAL | Status: DC | PRN

## 2023-05-06 MED ORDER — SODIUM CHLORIDE 0.9 % IV SOLN: INTRAVENOUS | Status: DC | PRN

## 2023-05-06 MED ORDER — POLYETHYLENE GLYCOL 3350 17 G PO PACK: 17.0000 g | PACK | Freq: Every day | ORAL | Status: DC | PRN

## 2023-05-06 MED ORDER — HYDROMORPHONE HCL 1 MG/ML IJ SOLN
INTRAMUSCULAR | Status: AC
  Filled 2023-05-06: qty 1

## 2023-05-06 MED ORDER — VITAMIN C 500 MG PO TABS
1000.0000 mg | ORAL_TABLET | Freq: Every day | ORAL | Status: DC
  Administered 2023-05-06 – 2023-05-09 (×4): 1000 mg via ORAL
  Filled 2023-05-06 (×4): qty 2

## 2023-05-06 MED ORDER — FENTANYL CITRATE (PF) 250 MCG/5ML IJ SOLN
INTRAMUSCULAR | Status: AC
  Filled 2023-05-06: qty 5

## 2023-05-06 MED ORDER — ALUM & MAG HYDROXIDE-SIMETH 200-200-20 MG/5ML PO SUSP
15.0000 mL | ORAL | Status: DC | PRN
Start: 2023-05-06 — End: 2023-05-09

## 2023-05-06 MED ORDER — OXYCODONE HCL 5 MG/5ML PO SOLN: 5.0000 mg | Freq: Once | ORAL | Status: AC | PRN

## 2023-05-06 MED ORDER — ACETAMINOPHEN 10 MG/ML IV SOLN
INTRAVENOUS | Status: AC
  Filled 2023-05-06: qty 100

## 2023-05-06 MED ORDER — ONDANSETRON HCL 4 MG/2ML IJ SOLN
INTRAMUSCULAR | Status: DC | PRN
  Administered 2023-05-06: 4 mg via INTRAVENOUS

## 2023-05-06 MED ORDER — ONDANSETRON HCL 4 MG/2ML IJ SOLN: 4.0000 mg | Freq: Four times a day (QID) | INTRAMUSCULAR | Status: DC | PRN

## 2023-05-06 MED ORDER — TRANEXAMIC ACID-NACL 1000-0.7 MG/100ML-% IV SOLN
1000.0000 mg | INTRAVENOUS | Status: AC
  Administered 2023-05-06: 1000 mg via INTRAVENOUS
  Filled 2023-05-06: qty 100

## 2023-05-06 MED ORDER — TRANEXAMIC ACID 1000 MG/10ML IV SOLN
2000.0000 mg | INTRAVENOUS | Status: DC
  Filled 2023-05-06: qty 20

## 2023-05-06 MED ORDER — PROPOFOL 10 MG/ML IV BOLUS
INTRAVENOUS | Status: DC | PRN
  Administered 2023-05-06: 150 mg via INTRAVENOUS

## 2023-05-06 MED ORDER — HYDROMORPHONE HCL 1 MG/ML IJ SOLN
0.5000 mg | INTRAMUSCULAR | Status: DC | PRN
Start: 2023-05-06 — End: 2023-05-09
  Administered 2023-05-06 – 2023-05-07 (×2): 1 mg via INTRAVENOUS
  Filled 2023-05-06 (×2): qty 1

## 2023-05-06 MED ORDER — LIDOCAINE 2% (20 MG/ML) 5 ML SYRINGE
INTRAMUSCULAR | Status: DC | PRN
  Administered 2023-05-06: 60 mg via INTRAVENOUS

## 2023-05-06 MED ORDER — ONDANSETRON HCL 4 MG/2ML IJ SOLN: 4.0000 mg | Freq: Once | INTRAMUSCULAR | Status: DC | PRN

## 2023-05-06 MED ORDER — SODIUM CHLORIDE 0.9 % IV SOLN: INTRAVENOUS | Status: AC

## 2023-05-06 MED ORDER — JUVEN PO PACK
1.0000 | PACK | Freq: Two times a day (BID) | ORAL | Status: DC
Start: 2023-05-06 — End: 2023-05-09
  Administered 2023-05-06 – 2023-05-08 (×2): 1 via ORAL
  Filled 2023-05-06 (×5): qty 1

## 2023-05-06 MED ORDER — PHENOL 1.4 % MT LIQD: 1.0000 | OROMUCOSAL | Status: DC | PRN

## 2023-05-06 MED ORDER — OXYCODONE HCL 5 MG PO TABS
10.0000 mg | ORAL_TABLET | ORAL | Status: DC | PRN
  Administered 2023-05-07 – 2023-05-09 (×7): 15 mg via ORAL
  Filled 2023-05-06 (×7): qty 3

## 2023-05-06 MED ORDER — BISACODYL 5 MG PO TBEC: 5.0000 mg | DELAYED_RELEASE_TABLET | Freq: Every day | ORAL | Status: DC | PRN

## 2023-05-06 MED ORDER — GUAIFENESIN-DM 100-10 MG/5ML PO SYRP: 15.0000 mL | ORAL_SOLUTION | ORAL | Status: DC | PRN

## 2023-05-06 SURGICAL SUPPLY — 42 items
BAG COUNTER SPONGE SURGICOUNT (BAG) IMPLANT
BAG SPNG CNTER NS LX DISP (BAG)
BLADE SAW RECIP 87.9 MT (BLADE) ×1 IMPLANT
BLADE SURG 21 STRL SS (BLADE) ×1 IMPLANT
BNDG CMPR 5X6 CHSV STRCH STRL (GAUZE/BANDAGES/DRESSINGS) ×1
BNDG COHESIVE 6X5 TAN ST LF (GAUZE/BANDAGES/DRESSINGS) ×1 IMPLANT
CANISTER WOUND CARE 500ML ATS (WOUND CARE) IMPLANT
COVER SURGICAL LIGHT HANDLE (MISCELLANEOUS) ×1 IMPLANT
CUFF TOURN SGL QUICK 34 (TOURNIQUET CUFF)
CUFF TRNQT CYL 34X4.125X (TOURNIQUET CUFF) IMPLANT
DRAPE DERMATAC (DRAPES) IMPLANT
DRAPE INCISE IOBAN 66X45 STRL (DRAPES) ×2 IMPLANT
DRAPE U-SHAPE 47X51 STRL (DRAPES) ×1 IMPLANT
DRESSING PREVENA PLUS CUSTOM (GAUZE/BANDAGES/DRESSINGS) ×1 IMPLANT
DRSG PREVENA PLUS CUSTOM (GAUZE/BANDAGES/DRESSINGS) ×1
DURAPREP 26ML APPLICATOR (WOUND CARE) ×1 IMPLANT
ELECT REM PT RETURN 9FT ADLT (ELECTROSURGICAL) ×1
ELECTRODE REM PT RTRN 9FT ADLT (ELECTROSURGICAL) ×1 IMPLANT
GLOVE BIOGEL PI IND STRL 7.5 (GLOVE) ×1 IMPLANT
GLOVE BIOGEL PI IND STRL 9 (GLOVE) ×1 IMPLANT
GLOVE SURG ORTHO 9.0 STRL STRW (GLOVE) ×1 IMPLANT
GLOVE SURG SS PI 6.5 STRL IVOR (GLOVE) ×1 IMPLANT
GOWN STRL REUS W/ TWL LRG LVL3 (GOWN DISPOSABLE) ×1 IMPLANT
GOWN STRL REUS W/ TWL XL LVL3 (GOWN DISPOSABLE) ×2 IMPLANT
GOWN STRL REUS W/TWL LRG LVL3 (GOWN DISPOSABLE) ×1
GOWN STRL REUS W/TWL XL LVL3 (GOWN DISPOSABLE) ×2
GRAFT SKIN WND MICRO 38 (Tissue) IMPLANT
KIT BASIN OR (CUSTOM PROCEDURE TRAY) ×1 IMPLANT
KIT TURNOVER KIT B (KITS) ×1 IMPLANT
MANIFOLD NEPTUNE II (INSTRUMENTS) ×1 IMPLANT
NS IRRIG 1000ML POUR BTL (IV SOLUTION) ×1 IMPLANT
PACK ORTHO EXTREMITY (CUSTOM PROCEDURE TRAY) ×1 IMPLANT
PAD ARMBOARD 7.5X6 YLW CONV (MISCELLANEOUS) ×1 IMPLANT
PREVENA RESTOR ARTHOFORM 46X30 (CANNISTER) ×1 IMPLANT
STAPLER VISISTAT 35W (STAPLE) IMPLANT
STOCKINETTE IMPERVIOUS LG (DRAPES) IMPLANT
SUT ETHILON 2 0 PSLX (SUTURE) ×2 IMPLANT
SUT SILK 2 0 (SUTURE) ×1
SUT SILK 2-0 18XBRD TIE 12 (SUTURE) ×1 IMPLANT
TOWEL GREEN STERILE FF (TOWEL DISPOSABLE) ×1 IMPLANT
TUBE CONNECTING 20X1/4 (TUBING) ×1 IMPLANT
YANKAUER SUCT BULB TIP NO VENT (SUCTIONS) ×1 IMPLANT

## 2023-05-06 NOTE — Anesthesia Postprocedure Evaluation (Signed)
Anesthesia Post Note  Patient: Bruce Deleon Aday  Procedure(s) Performed: LEFT ABOVE KNEE AMPUTATION (Left: Knee)     Patient location during evaluation: PACU Anesthesia Type: General Level of consciousness: awake and alert Pain management: pain level controlled Vital Signs Assessment: post-procedure vital signs reviewed and stable Respiratory status: spontaneous breathing, nonlabored ventilation, respiratory function stable and patient connected to nasal cannula oxygen Cardiovascular status: blood pressure returned to baseline and stable Postop Assessment: no apparent nausea or vomiting Anesthetic complications: no  No notable events documented.  Last Vitals:  Vitals:   05/06/23 1030 05/06/23 1045  BP: (!) 143/65 117/63  Pulse: (!) 55 (!) 56  Resp: 11 12  Temp:    SpO2: 96% 93%    Last Pain:  Vitals:   05/06/23 1045  TempSrc:   PainSc: 9                  Janiya Millirons S

## 2023-05-06 NOTE — Interval H&P Note (Signed)
History and Physical Interval Note:  05/06/2023 9:10 AM  Bruce Deleon  has presented today for surgery, with the diagnosis of Gangrene Left Leg.  The various methods of treatment have been discussed with the patient and family. After consideration of risks, benefits and other options for treatment, the patient has consented to  Procedure(s): LEFT ABOVE KNEE AMPUTATION (Left) as a surgical intervention.  The patient's history has been reviewed, patient examined, no change in status, stable for surgery.  I have reviewed the patient's chart and labs.  Questions were answered to the patient's satisfaction.     Nadara Mustard

## 2023-05-06 NOTE — Transfer of Care (Signed)
Immediate Anesthesia Transfer of Care Note  Patient: Bruce Deleon  Procedure(s) Performed: LEFT ABOVE KNEE AMPUTATION (Left: Knee)  Patient Location: PACU  Anesthesia Type:General  Level of Consciousness: drowsy and patient cooperative  Airway & Oxygen Therapy: Patient Spontanous Breathing  Post-op Assessment: Report given to RN and Post -op Vital signs reviewed and stable  Post vital signs: Reviewed and stable  Last Vitals:  Vitals Value Taken Time  BP 134/67 05/06/23 1016  Temp    Pulse 73 05/06/23 1018  Resp 16 05/06/23 1018  SpO2 99 % 05/06/23 1018  Vitals shown include unfiled device data.  Last Pain:  Vitals:   05/06/23 0803  TempSrc:   PainSc: 6       Patients Stated Pain Goal: 2 (05/06/23 0803)  Complications: No notable events documented.

## 2023-05-06 NOTE — Op Note (Signed)
05/06/2023  10:20 AM  PATIENT:  Bruce Deleon    PRE-OPERATIVE DIAGNOSIS:  Gangrene Left Leg  POST-OPERATIVE DIAGNOSIS:  Same  PROCEDURE:  LEFT ABOVE KNEE AMPUTATION  SURGEON:  Nadara Mustard, MD  PHYSICIAN ASSISTANT:None ANESTHESIA:   General  PREOPERATIVE INDICATIONS:  [name] is a  67 y.o. male with a diagnosis of Gangrene Left Leg who failed conservative measures and elected for surgical management.    The risks benefits and alternatives were discussed with the patient preoperatively including but not limited to the risks of infection, bleeding, nerve injury, cardiopulmonary complications, the need for revision surgery, among others, and the patient was willing to proceed.  OPERATIVE IMPLANTS:   Implant Name Type Inv. Item Serial No. Manufacturer Lot No. LRB No. Used Action  GRAFT SKIN WND MICRO 38 - [id number] Tissue GRAFT SKIN WND MICRO 38  [employer] [id number] Left 1 Implanted    @ENCIMAGES @  OPERATIVE FINDINGS: Patient's femoral artery bypass graft was completely occluded.  OPERATIVE PROCEDURE: Patient brought the operating room and underwent a general anesthetic.  After adequate anesthesia obtained patient's left lower extremity was prepped using DuraPrep draped into a sterile field a timeout was called.  A fishmouth incision was made proximal to the patella.  This was carried down to bone.  Dissection was carried down to the inner muscular septum.  The vascular bundle was clampe and suture-ligated with 2-0 silk.  The femoral artery graft was completely occluded.  A reciprocating saw was used to complete the transfemoral amputation.  The sciatic nerve was pulled Klatman the [place] tract.  Electrocautery was used for hemostasis.  The wound bed was filled with 38 cm of Kerecis micro graft to facilitate soft tissue healing.  The deep and superficial fascial layers were closed using #1 Vicryl skin was closed using staples and a Prevena customizable wound VAC was  applied this had a good suction fit patient was extubated taken the PACU in stable condition   DISCHARGE PLANNING:  Antibiotic duration: 24 hours  Weightbearing: Weightbearing as tolerated on the right  Pain medication: Opioid pathway  Dressing care/ Wound VAC: Continue wound VAC for 1 week  Ambulatory devices: Walker  Discharge to: Discharge planning based on therapy recommendations.  Follow-up: In the office 1 week post operative.

## 2023-05-06 NOTE — Plan of Care (Signed)
  Problem: Education: Goal: Knowledge of the prescribed therapeutic regimen will improve Outcome: Progressing Goal: Ability to verbalize activity precautions or restrictions will improve Outcome: Progressing Goal: Understanding of discharge needs will improve Outcome: Progressing   Problem: Activity: Goal: Ability to perform//tolerate increased activity and mobilize with assistive devices will improve Outcome: Progressing   Problem: Clinical Measurements: Goal: Postoperative complications will be avoided or minimized Outcome: Progressing   Problem: Self-Care: Goal: Ability to meet self-care needs will improve Outcome: Progressing   Problem: Self-Concept: Goal: Ability to maintain and perform role responsibilities to the fullest extent possible will improve Outcome: Progressing   Problem: Pain Management: Goal: Pain level will decrease with appropriate interventions Outcome: Progressing   Problem: Skin Integrity: Goal: Demonstration of wound healing without infection will improve Outcome: Progressing   Problem: Education: Goal: Knowledge of General Education information will improve Description: Including pain rating scale, medication(s)/side effects and non-pharmacologic comfort measures Outcome: Progressing   Problem: Health Behavior/Discharge Planning: Goal: Ability to manage health-related needs will improve Outcome: Progressing   Problem: Clinical Measurements: Goal: Ability to maintain clinical measurements within normal limits will improve Outcome: Progressing Goal: Will remain free from infection Outcome: Progressing Goal: Diagnostic test results will improve Outcome: Progressing Goal: Respiratory complications will improve Outcome: Progressing Goal: Cardiovascular complication will be avoided Outcome: Progressing   Problem: Activity: Goal: Risk for activity intolerance will decrease Outcome: Progressing   Problem: Nutrition: Goal: Adequate nutrition  will be maintained Outcome: Progressing   Problem: Coping: Goal: Level of anxiety will decrease Outcome: Progressing   Problem: Elimination: Goal: Will not experience complications related to bowel motility Outcome: Progressing Goal: Will not experience complications related to urinary retention Outcome: Progressing   Problem: Pain Management: Goal: General experience of comfort will improve Outcome: Progressing   Problem: Safety: Goal: Ability to remain free from injury will improve Outcome: Progressing   Problem: Skin Integrity: Goal: Risk for impaired skin integrity will decrease Outcome: Progressing

## 2023-05-06 NOTE — H&P (Signed)
Bruce Deleon is an 67 y.o. male.   Chief Complaint: Ischemic pain left below-knee amputation. HPI: Patient is a 67 year old gentleman who is status post left below-knee amputation who is seen for initial evaluation for ischemic pain and ulceration. Patient complains of constant ischemic pain in his left transtibial amputation. Past medical history positive for tobacco.   Past Medical History:  Diagnosis Date   ALS (amyotrophic lateral sclerosis) (HCC) 01/25/2013   Asthma    Chronic respiratory insufficiency 06/12/2014   Essential hypertension 01/25/2013    Past Surgical History:  Procedure Laterality Date   AMPUTATION Left 08/06/2018   Procedure: LEFT BELOW KNEE AMPUTATION;  Surgeon: Kathryne Hitch, MD;  Location: [facility];  Service: Orthopedics;  Laterality: Left;   APPLICATION OF WOUND VAC Left 08/04/2018   Procedure: APPLICATION OF WOUND VAC;  Surgeon: Kathryne Hitch, MD;  Location: [facility];  Service: Orthopedics;  Laterality: Left;   BACK SURGERY     EYE SURGERY Bilateral 2010   cataract   I & D EXTREMITY Left 08/01/2018   Procedure: IRRIGATION AND DEBRIDEMENT foot and ankle amputation first and second rays;  Surgeon: Kathryne Hitch, MD;  Location: [facility];  Service: Orthopedics;  Laterality: Left;   I & D EXTREMITY Left 08/04/2018   Procedure: REPEAT IRRIGATION AND DEBRIDEMENT OF LEFT ANKLE;  Surgeon: Kathryne Hitch, MD;  Location: [facility];  Service: Orthopedics;  Laterality: Left;    History reviewed. No pertinent family history. Social History:  reports that he has been smoking cigarettes. He has never used smokeless tobacco. No history on file for alcohol use and drug use.  Allergies: No Known Allergies  No medications prior to admission.    No results found for this or any previous visit (from the past 48 hour(s)). No results found.  Review of Systems  All other systems reviewed and are negative.   There were no vitals taken for this  visit. Physical Exam  Patient is alert, oriented, no adenopathy, well-dressed, normal affect, normal respiratory effort. Examination patient has pain to light touch for the left transtibial amputation with ischemic pain there is a fixed flexion contracture of the knee.  Patient is unable to wear his prosthesis secondary to this contracture.  Patient has had an MRI scan which shows ulceration down to bone. Assessment/Plan 1. Atherosclerosis of native arteries of extremities with gangrene, left leg (HCC)   2. History of left below knee amputation (HCC)       Plan: Recommended patient proceed with a left above-the-knee amputation.  With the fixed flexion contracture of the left below-knee amputation and ischemic changes patient is not a candidate for revision of the below-knee amputation..  Patient states he understands wishes to proceed with above-knee amputation he will call when he is ready to proceed with surgery.  Nadara Mustard, MD 05/06/2023, 6:34 AM

## 2023-05-06 NOTE — Anesthesia Procedure Notes (Signed)
Procedure Name: LMA Insertion Date/Time: 05/06/2023 9:46 AM  Performed by: Gus Puma, CRNAPre-anesthesia Checklist: Patient identified, Emergency Drugs available, Suction available and Patient being monitored Patient Re-evaluated:Patient Re-evaluated prior to induction Oxygen Delivery Method: Circle System Utilized Preoxygenation: Pre-oxygenation with 100% oxygen Induction Type: IV induction Ventilation: Mask ventilation without difficulty LMA: LMA inserted LMA Size: 4.0 Number of attempts: 1 Airway Equipment and Method: Bite block Placement Confirmation: positive ETCO2 Tube secured with: Tape Dental Injury: Teeth and Oropharynx as per pre-operative assessment

## 2023-05-07 ENCOUNTER — Encounter (HOSPITAL_COMMUNITY): Payer: Self-pay | Admitting: Orthopedic Surgery

## 2023-05-07 NOTE — Progress Notes (Signed)
Patient ID: Bruce Deleon, male   DOB: 04/10/1956, 66 y.o.   MRN: [id number] Patient is postoperative day 1 left above-knee amputation.  There is no drainage of the wound VAC canister there is not a good suction fit.  Discharge planning based on therapy recommendations.

## 2023-05-07 NOTE — Evaluation (Signed)
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 PMH includes: ALS, asthma, HTN, and L BKA in 2020.   Clinical Impression  Pt in bed upon arrival of PT, agreeable to evaluation at this time. Prior to admission the pt was completing squat-pivot transfers independently, completing most mobility in WC. He reports e was limited to within-home mobility mostly recently due to pain, but is independent with ADLs and even driving sometimes recently. The pt was able to demo good mobility with bed mobility and squat-pivot transfer to recliner with supervision for safety only. He reports this is how he was transferring prior to admission and has all needed DME to return home at [facility] level, and therefore is safe to return home when medically stable for d/c.   However, the pt also expressed that his goal is to get a prosthetic for his LLE, and to do this he will need significant rehab to regain strength in RLE and BUE to make transfers, standing, and gait training possible. Although he can squat-pivot with supervision, he needed modA of 2 to rise to standing, and demos poor tolerance for standing activity and poor power in his RLE. I also question the longevity and practicality of gait training given the degenerative nature of ALS, however this will need to be discussed with the patient at a later time pending his progress with strengthening to allow for standing activities.      If plan is discharge home, recommend the following: A little help with walking and/or transfers;A little help with bathing/dressing/bathroom;Help with stairs or ramp for entrance   Can travel by private vehicle        Equipment Recommendations None recommended by PT  Recommendations for Other Services       Functional Status Assessment Patient  has had a recent decline in their functional status and demonstrates the ability to make significant improvements in function in a reasonable and predictable amount of time.     Precautions / Restrictions Precautions Precaution Comments: wound vac LLE Restrictions Weight Bearing Restrictions: Yes LLE Weight Bearing: Non weight bearing      Mobility  Bed Mobility Overal bed mobility: Modified Independent             General bed mobility comments: use of bed rails, increased time, management of wound vac    Transfers Overall transfer level: Needs assistance Equipment used: None Transfers: Bed to chair/wheelchair/BSC, Sit to/from Stand Sit to Stand: Mod assist, +2 physical assistance     Squat pivot transfers: Supervision     General transfer comment: supervision for safety with squat-pivot to RLE, pt with good use of UE to position. modA of 2 to achieve standing position, poor power in RLE    Ambulation/Gait               General Gait Details: pt unable at baseline  Stairs            Wheelchair Mobility     Tilt Bed    Modified Rankin (Stroke Patients Only)       Balance Overall balance assessment: Needs assistance Sitting-balance support: No upper extremity supported, Feet supported Sitting balance-Leahy Scale: Good     Standing balance support: Bilateral upper extremity supported, During functional activity Standing balance-Leahy Scale: Poor  Pertinent Vitals/Pain Pain Assessment Pain Assessment: Faces Faces Pain Scale: Hurts little more Pain Location: L thigh/incision Pain Descriptors / Indicators: Discomfort Pain Intervention(s): Limited activity within patient's tolerance, Monitored during session, Repositioned    Home Living Family/patient expects to be discharged to:: Private residence Living Arrangements: Spouse/significant other Available Help at Discharge: Family Type of Home:  House Home Access: Stairs to enter;Ramped entrance Entrance Stairs-Rails: Left Entrance Stairs-Number of Steps: 2   Home Layout: One level Home Equipment: Shower seat;Wheelchair - manual;BSC/3in1      Prior Function Prior Level of Function : Independent/Modified Independent;Driving             Mobility Comments: scoot transfers with wheelchair ADLs Comments: ind ADLs and iADLS; reports sponge bathing and using BSC  due to wc not fitting in bathroom     Extremity/Trunk Assessment   Upper Extremity Assessment Upper Extremity Assessment: Defer to OT evaluation    Lower Extremity Assessment Lower Extremity Assessment: RLE deficits/detail;LLE deficits/detail RLE Deficits / Details: significant weakness and muscle atrophy, grossly 4/5 to MMT. pt able to maintain static stand with BUE support, poor ability to extend knee to neutral. LLE Deficits / Details: limited hip extension, pt with good movement in flexion and hip add/abd. not MMT due to tenderness around amputation LLE Sensation: WNL LLE Coordination: WNL    Cervical / Trunk Assessment Cervical / Trunk Assessment: Other exceptions Cervical / Trunk Exceptions: significant muscle atrophy  Communication   Communication Communication: No apparent difficulties Cueing Techniques: Verbal cues  Cognition Arousal: Alert Behavior During Therapy: WFL for tasks assessed/performed Overall Cognitive Status: Within Functional Limits for tasks assessed                                          General Comments General comments (skin integrity, edema, etc.): VSS on RA, pt encouraged to complete ROM exercises for L hip    Exercises Amputee Exercises Hip Extension: AROM, Left, 5 reps, Standing   Assessment/Plan    PT Assessment Patient needs continued PT services  PT Problem List Decreased strength;Decreased range of motion;Decreased activity tolerance;Decreased balance;Decreased mobility;Pain       PT  Treatment Interventions DME instruction;Gait training;Functional mobility training;Therapeutic activities;Therapeutic exercise;Balance training    PT Goals (Current goals can be found in the Care Plan section)  Acute Rehab PT Goals Patient Stated Goal: to get prosthetic for LLE PT Goal Formulation: With patient Time For Goal Achievement: 05/21/23 Potential to Achieve Goals: Fair    Frequency Min 1X/week     Co-evaluation               AM-PAC PT "6 Clicks" Mobility  Outcome Measure Help needed turning from your back to your side while in a flat bed without using bedrails?: None Help needed moving from lying on your back to sitting on the side of a flat bed without using bedrails?: None Help needed moving to and from a bed to a chair (including a wheelchair)?: None Help needed standing up from a chair using your arms (e.g., wheelchair or bedside chair)?: A Lot Help needed to walk in hospital room?: Total Help needed climbing 3-5 steps with a railing? : Total 6 Click Score: 16    End of Session Equipment Utilized During Treatment: Gait belt Activity Tolerance: Patient tolerated treatment well Patient left: in chair;with call bell/phone within reach;with chair alarm set Nurse Communication: Mobility status PT  Visit Diagnosis: Other abnormalities of gait and mobility (R26.89);Pain;Muscle weakness (generalized) (M62.81) Pain - Right/Left: Left Pain - part of body: Leg    Time: 5621-3086 PT Time Calculation (min) (ACUTE ONLY): 27 min   Charges:   PT Evaluation $PT Eval Low Complexity: 1 Low   PT General Charges $$ ACUTE PT VISIT: 1 Visit         Vickki Muff, PT, DPT   Acute Rehabilitation Department Office [phone] Secure Chat Communication Preferred   Ronnie Derby 05/07/2023, 1:21 PM

## 2023-05-07 NOTE — Plan of Care (Signed)
  Problem: Education: Goal: Knowledge of the prescribed therapeutic regimen will improve Outcome: Progressing Goal: Ability to verbalize activity precautions or restrictions will improve Outcome: Progressing Goal: Understanding of discharge needs will improve Outcome: Progressing   Problem: Activity: Goal: Ability to perform//tolerate increased activity and mobilize with assistive devices will improve Outcome: Progressing   Problem: Clinical Measurements: Goal: Postoperative complications will be avoided or minimized Outcome: Progressing   Problem: Self-Care: Goal: Ability to meet self-care needs will improve Outcome: Progressing   Problem: Self-Concept: Goal: Ability to maintain and perform role responsibilities to the fullest extent possible will improve Outcome: Progressing   Problem: Pain Management: Goal: Pain level will decrease with appropriate interventions Outcome: Progressing   Problem: Skin Integrity: Goal: Demonstration of wound healing without infection will improve Outcome: Progressing   Problem: Education: Goal: Knowledge of General Education information will improve Description: Including pain rating scale, medication(s)/side effects and non-pharmacologic comfort measures Outcome: Progressing   Problem: Health Behavior/Discharge Planning: Goal: Ability to manage health-related needs will improve Outcome: Progressing   Problem: Clinical Measurements: Goal: Ability to maintain clinical measurements within normal limits will improve Outcome: Progressing Goal: Will remain free from infection Outcome: Progressing Goal: Diagnostic test results will improve Outcome: Progressing Goal: Respiratory complications will improve Outcome: Progressing Goal: Cardiovascular complication will be avoided Outcome: Progressing   Problem: Activity: Goal: Risk for activity intolerance will decrease Outcome: Progressing   Problem: Nutrition: Goal: Adequate nutrition  will be maintained Outcome: Progressing   Problem: Coping: Goal: Level of anxiety will decrease Outcome: Progressing   Problem: Elimination: Goal: Will not experience complications related to bowel motility Outcome: Progressing Goal: Will not experience complications related to urinary retention Outcome: Progressing   Problem: Pain Management: Goal: General experience of comfort will improve Outcome: Progressing   Problem: Safety: Goal: Ability to remain free from injury will improve Outcome: Progressing   Problem: Skin Integrity: Goal: Risk for impaired skin integrity will decrease Outcome: Progressing

## 2023-05-07 NOTE — Progress Notes (Signed)
Inpatient Rehabilitation Admissions Coordinator   [facility] is recommended. We will not pursue CIR . We will sign off.  Ottie Glazier, RN, MSN Rehab Admissions Coordinator [phone] 05/07/2023 2:14 PM

## 2023-05-07 NOTE — Evaluation (Signed)
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 PMH includes: ALS, asthma, HTN, and L BKA in 2020.   Clinical Impression   PTA patient reports completing squat pivot transfers, w/c mobility and ADLs with modified independence.  He was admitted for above and presents with problem list below.  He requires supervision for transfers today, up to min assist for ADLs.  Discussed compensatory strategies for ADLs, safety and fall prevention.  Patient has all DME needed, typically uses BSC as w/c does not fit in bathroom.  Based on performance today, will follow acutely to ensure maximal safety and independence. Recommend home health OT at dc.       If plan is discharge home, recommend the following: A little help with walking and/or transfers;A little help with bathing/dressing/bathroom;Assistance with cooking/housework;Assist for transportation;Help with stairs or ramp for entrance    Functional Status Assessment  Patient has had a recent decline in their functional status and demonstrates the ability to make significant improvements in function in a reasonable and predictable amount of time.  Equipment Recommendations  None recommended by OT    Recommendations for Other Services       Precautions / Restrictions Precautions Precautions: Fall Precaution Comments: wound vac LLE Restrictions Weight Bearing Restrictions: Yes LLE Weight Bearing: Non weight bearing      Mobility Bed Mobility Overal bed mobility: Modified Independent             General bed mobility comments: use of bed rails, increased time, management of wound vac    Transfers Overall transfer level: Needs assistance Equipment used: None Transfers: Bed to chair/wheelchair/BSC, Sit to/from Stand Sit to Stand: Mod assist,  +2 physical assistance   Squat pivot transfers: Supervision       General transfer comment: supervision for safety with squat-pivot to RLE, pt with good use of UE to position. modA of 2 to achieve standing position, poor power in RLE      Balance Overall balance assessment: Needs assistance Sitting-balance support: No upper extremity supported, Feet supported Sitting balance-Leahy Scale: Good Sitting balance - Comments: dyanmically min guard for safety at times   Standing balance support: Bilateral upper extremity supported, During functional activity Standing balance-Leahy Scale: Poor                             ADL either performed or assessed with clinical judgement   ADL Overall ADL's : Needs assistance/impaired     Grooming: Set up;Sitting           Upper Body Dressing : Set up;Sitting   Lower Body Dressing: Minimal assistance;Sitting/lateral leans   Toilet Transfer: Supervision/safety;Squat-pivot Toilet Transfer Details (indicate cue type and reason): bed to recliner         Functional mobility during ADLs: Supervision/safety;Contact guard assist       Vision Baseline Vision/History: 1 Wears glasses Vision Assessment?: Wears glasses for reading     Perception         Praxis         Pertinent Vitals/Pain Pain Assessment Pain Assessment: Faces Faces Pain Scale: Hurts little more Pain Location: L thigh/incision Pain Descriptors / Indicators: Discomfort Pain Intervention(s): Limited activity within patient's tolerance, Monitored during session, Repositioned     Extremity/Trunk Assessment Upper  Extremity Assessment Upper Extremity Assessment: Generalized weakness;Right hand dominant;RUE deficits/detail (grossly 3+/5 BUEs with atrophy noted in B hands) RUE Deficits / Details: R finger contractures at DIP and PIP digits 2-4 but functional. RUE Coordination: decreased fine motor   Lower Extremity Assessment Lower Extremity Assessment:  Defer to PT evaluation RLE Deficits / Details: significant weakness and muscle atrophy, grossly 4/5 to MMT. pt able to maintain static stand with BUE support, poor ability to extend knee to neutral. LLE Deficits / Details: limited hip extension, pt with good movement in flexion and hip add/abd. not MMT due to tenderness around amputation LLE Sensation: WNL LLE Coordination: WNL   Cervical / Trunk Assessment Cervical / Trunk Assessment: Other exceptions Cervical / Trunk Exceptions: significant muscle atrophy   Communication Communication Communication: No apparent difficulties Cueing Techniques: Verbal cues   Cognition Arousal: Alert Behavior During Therapy: WFL for tasks assessed/performed Overall Cognitive Status: Within Functional Limits for tasks assessed                                       General Comments  VSS on RA, pt encouraged to complete ROM exercises for L hip    Exercises     Shoulder Instructions      Home Living Family/patient expects to be discharged to:: Private residence Living Arrangements: Spouse/significant other Available Help at Discharge: Family Type of Home: House Home Access: Stairs to enter;Ramped entrance Entrance Stairs-Number of Steps: 2 Entrance Stairs-Rails: Left Home Layout: One level     Bathroom Shower/Tub: Chief Strategy Officer: Standard     Home Equipment: Information systems manager;Wheelchair - manual;BSC/3in1          Prior Functioning/Environment Prior Level of Function : Independent/Modified Independent;Driving             Mobility Comments: scoot transfers with wheelchair ADLs Comments: ind ADLs and iADLS; reports sponge bathing and using BSC  due to wc not fitting in bathroom        OT Problem List: Decreased strength;Decreased activity tolerance;Impaired balance (sitting and/or standing);Decreased knowledge of precautions;Decreased safety awareness;Decreased knowledge of use of DME or AE;Pain       OT Treatment/Interventions: Self-care/ADL training;Therapeutic exercise;DME and/or AE instruction;Therapeutic activities;Patient/family education;Balance training    OT Goals(Current goals can be found in the care plan section) Acute Rehab OT Goals Patient Stated Goal: home OT Goal Formulation: With patient Time For Goal Achievement: 05/21/23 Potential to Achieve Goals: Good  OT Frequency: Min 1X/week    Co-evaluation              AM-PAC OT "6 Clicks" Daily Activity     Outcome Measure Help from another person eating meals?: None Help from another person taking care of personal grooming?: A Little Help from another person toileting, which includes using toliet, bedpan, or urinal?: A Little Help from another person bathing (including washing, rinsing, drying)?: A Little Help from another person to put on and taking off regular upper body clothing?: A Little Help from another person to put on and taking off regular lower body clothing?: A Little 6 Click Score: 19   End of Session Equipment Utilized During Treatment: Gait belt;Rolling walker (2 wheels) Nurse Communication: Mobility status  Activity Tolerance: Patient tolerated treatment well Patient left: in chair;with call bell/phone within reach;with chair alarm set  OT Visit Diagnosis: Other abnormalities of gait and mobility (R26.89);Muscle weakness (generalized) (M62.81);Pain Pain - Right/Left:  Left Pain - part of body: Leg                Time: 4540-9811 OT Time Calculation (min): 26 min Charges:  OT General Charges $OT Visit: 1 Visit OT Evaluation $OT Eval Moderate Complexity: 1 Mod  Barry Brunner, OT Acute Rehabilitation Services Office [phone]   Chancy Milroy 05/07/2023, 2:13 PM

## 2023-05-08 NOTE — Progress Notes (Signed)
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 PMH includes: ALS, asthma, HTN, and L BKA in 2020.    PT Comments  The pt reports more pain in LLE today, therefore declined OOB mobility and elected to practice bed level and seated exercises. Demos limited L hip extension, given 2 different positions to stretch  L hip flexors safely from bed, pt able to demo good technique and verbalized understanding. Demos good activation and ROM with flexion, ADD, and ABD at hip today, reinforced importance of strengthening in his RLE, core, and bilateral UE if his goal is to return to walking with a prosthetic. Pt aware this is a long-term goal, but he needs to regain significant strength to allow for prosthetic training, however, pt declined attempts at OOB or RLE strengthening today due to pain in LLE. Continues to be safe for d/c home at [facility] level.    If plan is discharge home, recommend the following: A little help with walking and/or transfers;A little help with bathing/dressing/bathroom;Help with stairs or ramp for entrance   Can travel by private vehicle        Equipment Recommendations  None recommended by PT    Recommendations for Other Services       Precautions / Restrictions Precautions Precautions: Fall Precaution Comments: wound vac LLE Restrictions Weight Bearing Restrictions: Yes LLE Weight Bearing: Non weight bearing     Mobility  Bed Mobility Overal bed mobility: Modified Independent             General bed mobility comments: use of bed rails, increased time, management of wound vac    Transfers                   General transfer comment: pt declined due to pain    Ambulation/Gait                   Stairs             Wheelchair Mobility     Tilt  Bed    Modified Rankin (Stroke Patients Only)       Balance Overall balance assessment: Needs assistance Sitting-balance support: No upper extremity supported, Feet supported Sitting balance-Leahy Scale: Good     Standing balance support: Bilateral upper extremity supported, During functional activity Standing balance-Leahy Scale: Poor                              Cognition Arousal: Alert Behavior During Therapy: WFL for tasks assessed/performed Overall Cognitive Status: Within Functional Limits for tasks assessed                                 General Comments: pt today with some impulsive thoughts and poor problem solving. Pt talking about returning to work next week (currently retired) and not wanting follow up therapy despite goal of prosthetic for LLE. pt states he would like to re-assess after getting sleep at home        Exercises Amputee Exercises Hip Extension: AROM, Left, 5 reps, Supine, Sidelying Hip ABduction/ADduction: AROM, Left, 10 reps, Supine Other Exercises Other Exercises: supine hip flexor stretch x 3 with LLE Other Exercises: review of amputee  exercises handout with pt and wife    General Comments        Pertinent Vitals/Pain Pain Assessment Pain Assessment: Faces Faces Pain Scale: Hurts even more Pain Location: L thigh/incision Pain Descriptors / Indicators: Discomfort Pain Intervention(s): Limited activity within patient's tolerance, Monitored during session, Repositioned    Home Living                          Prior Function            PT Goals (current goals can now be found in the care plan section) Acute Rehab PT Goals Patient Stated Goal: to get prosthetic for LLE PT Goal Formulation: With patient/family Time For Goal Achievement: 05/21/23 Potential to Achieve Goals: Fair Progress towards PT goals: Progressing toward goals    Frequency    Min 1X/week      PT Plan       Co-evaluation              AM-PAC PT "6 Clicks" Mobility   Outcome Measure  Help needed turning from your back to your side while in a flat bed without using bedrails?: None Help needed moving from lying on your back to sitting on the side of a flat bed without using bedrails?: None Help needed moving to and from a bed to a chair (including a wheelchair)?: None Help needed standing up from a chair using your arms (e.g., wheelchair or bedside chair)?: A Lot Help needed to walk in hospital room?: Total Help needed climbing 3-5 steps with a railing? : Total 6 Click Score: 16    End of Session Equipment Utilized During Treatment: Gait belt Activity Tolerance: Patient tolerated treatment well Patient left: in bed;with call bell/phone within reach;with family/visitor present (sitting EOB) Nurse Communication: Mobility status PT Visit Diagnosis: Other abnormalities of gait and mobility (R26.89);Pain;Muscle weakness (generalized) (M62.81) Pain - Right/Left: Left Pain - part of body: Leg     Time: 6213-0865 PT Time Calculation (min) (ACUTE ONLY): 38 min  Charges:    $Therapeutic Exercise: 23-37 mins $Self Care/Home Management: 8-22 PT General Charges $$ ACUTE PT VISIT: 1 Visit                     Vickki Muff, PT, DPT   Acute Rehabilitation Department Office [phone] Secure Chat Communication Preferred   Bruce Deleon 05/08/2023, 12:07 PM

## 2023-05-08 NOTE — Progress Notes (Signed)
Patient ID: Bruce Deleon, male   DOB: 12-05-1955, 67 y.o.   MRN: [id number] Patient is postoperative day 2 left above-knee amputation.  There is no drainage in the wound VAC canister there was not a good suction fit.  Patient is making good progress with therapy plan for discharge to home Saturday.  Will discontinue the wound VAC at discharge.

## 2023-05-08 NOTE — Plan of Care (Signed)
  Problem: Education: Goal: Knowledge of the prescribed therapeutic regimen will improve Outcome: Progressing Goal: Ability to verbalize activity precautions or restrictions will improve Outcome: Progressing Goal: Understanding of discharge needs will improve Outcome: Progressing   Problem: Activity: Goal: Ability to perform//tolerate increased activity and mobilize with assistive devices will improve Outcome: Progressing   Problem: Clinical Measurements: Goal: Postoperative complications will be avoided or minimized Outcome: Progressing   Problem: Self-Care: Goal: Ability to meet self-care needs will improve Outcome: Progressing   Problem: Self-Concept: Goal: Ability to maintain and perform role responsibilities to the fullest extent possible will improve Outcome: Progressing   Problem: Pain Management: Goal: Pain level will decrease with appropriate interventions Outcome: Progressing   Problem: Skin Integrity: Goal: Demonstration of wound healing without infection will improve Outcome: Progressing   Problem: Education: Goal: Knowledge of General Education information will improve Description: Including pain rating scale, medication(s)/side effects and non-pharmacologic comfort measures Outcome: Progressing   Problem: Health Behavior/Discharge Planning: Goal: Ability to manage health-related needs will improve Outcome: Progressing   Problem: Clinical Measurements: Goal: Ability to maintain clinical measurements within normal limits will improve Outcome: Progressing Goal: Will remain free from infection Outcome: Progressing Goal: Diagnostic test results will improve Outcome: Progressing Goal: Respiratory complications will improve Outcome: Progressing Goal: Cardiovascular complication will be avoided Outcome: Progressing   Problem: Activity: Goal: Risk for activity intolerance will decrease Outcome: Progressing   Problem: Nutrition: Goal: Adequate nutrition  will be maintained Outcome: Progressing   Problem: Coping: Goal: Level of anxiety will decrease Outcome: Progressing   Problem: Elimination: Goal: Will not experience complications related to bowel motility Outcome: Progressing Goal: Will not experience complications related to urinary retention Outcome: Progressing   Problem: Pain Management: Goal: General experience of comfort will improve Outcome: Progressing   Problem: Safety: Goal: Ability to remain free from injury will improve Outcome: Progressing   Problem: Skin Integrity: Goal: Risk for impaired skin integrity will decrease Outcome: Progressing

## 2023-05-09 ENCOUNTER — Encounter: Payer: Self-pay | Admitting: Orthopedic Surgery

## 2023-05-09 DIAGNOSIS — S78112A Complete traumatic amputation at level between left hip and knee, initial encounter: Secondary | ICD-10-CM

## 2023-05-09 MED ORDER — OXYCODONE-ACETAMINOPHEN 5-325 MG PO TABS: 1.0000 | ORAL_TABLET | ORAL | 0 refills | Status: DC | PRN

## 2023-05-09 NOTE — Discharge Summary (Signed)
Physician Discharge Summary  Patient ID: Bruce Deleon MRN: [id number] DOB/AGE: 1956/05/18 67 y.o.  Admit date: 05/06/2023 Discharge date: 05/09/2023  Admission Diagnoses:  Principal Problem:   Above knee amputation of left lower extremity (HCC) Active Problems:   PAD (peripheral artery disease) (HCC)   S/P AKA (above knee amputation) unilateral, left (HCC)   Discharge Diagnoses:  Same  Past Medical History:  Diagnosis Date   ALS (amyotrophic lateral sclerosis) (HCC) 01/25/2013   Asthma    Chronic respiratory insufficiency 06/12/2014   Essential hypertension 01/25/2013    Surgeries: Procedure(s): LEFT ABOVE KNEE AMPUTATION on 05/06/2023   Consultants:   Discharged Condition: Improved  Hospital Course: Bruce Deleon is an 67 y.o. male who was admitted 05/06/2023 with a chief complaint of No chief complaint on file. , and found to have a diagnosis of Above knee amputation of left lower extremity (HCC).  They were brought to the operating room on 05/06/2023 and underwent the above named procedures.    They were given perioperative antibiotics:  Anti-infectives (From admission, onward)    Start     Dose/Rate Route Frequency Ordered Stop   05/06/23 1730  ceFAZolin (ANCEF) IVPB 2g/100 mL premix        2 g 200 mL/hr over 30 Minutes Intravenous Every 8 hours 05/06/23 1312 05/07/23 0048   05/06/23 0745  ceFAZolin (ANCEF) IVPB 2g/100 mL premix        2 g 200 mL/hr over 30 Minutes Intravenous On call to O.R. 05/06/23 [id number] 05/06/23 0950     .  They were given compression stockings, early ambulation, and chemoprophylaxis for DVT prophylaxis.  They benefited maximally from their hospital stay and there were no complications.    Recent vital signs:  Vitals:   05/09/23 0502 05/09/23 0806  BP: (!) 158/63 130/75  Pulse: 76 94  Resp: 20   Temp: 97.7 F (36.5 C) 98.1 F (36.7 C)  SpO2: 100% 96%    Recent laboratory studies:  Results for orders placed or performed  during the hospital encounter of 05/06/23  Prealbumin  Result Value Ref Range   Prealbumin 17 (L) 18 - 38 mg/dL  CBC WITH DIFFERENTIAL  Result Value Ref Range   WBC 7.1 4.0 - 10.5 K/uL   RBC 5.25 4.22 - 5.81 MIL/uL   Hemoglobin 16.2 13.0 - 17.0 g/dL   HCT 11.9 14.7 - 82.9 %   MCV 93.1 80.0 - 100.0 fL   MCH 30.9 26.0 - 34.0 pg   MCHC 33.1 30.0 - 36.0 g/dL   RDW 56.2 13.0 - 86.5 %   Platelets 199 150 - 400 K/uL   nRBC 0.0 0.0 - 0.2 %   Neutrophils Relative % 76 %   Neutro Abs 5.4 1.7 - 7.7 K/uL   Lymphocytes Relative 13 %   Lymphs Abs 0.9 0.7 - 4.0 K/uL   Monocytes Relative 8 %   Monocytes Absolute 0.6 0.1 - 1.0 K/uL   Eosinophils Relative 2 %   Eosinophils Absolute 0.2 0.0 - 0.5 K/uL   Basophils Relative 1 %   Basophils Absolute 0.1 0.0 - 0.1 K/uL   Immature Granulocytes 0 %   Abs Immature Granulocytes 0.02 0.00 - 0.07 K/uL  Comprehensive metabolic panel  Result Value Ref Range   Sodium 134 (L) 135 - 145 mmol/L   Potassium 3.9 3.5 - 5.1 mmol/L   Chloride 98 98 - 111 mmol/L   CO2 26 22 - 32 mmol/L   Glucose, Bld 98 70 -  99 mg/dL   BUN 9 8 - 23 mg/dL   Creatinine, Ser 1.61 (L) 0.61 - 1.24 mg/dL   Calcium 8.9 8.9 - 09.6 mg/dL   Total Protein 6.7 6.5 - 8.1 g/dL   Albumin 3.5 3.5 - 5.0 g/dL   AST 20 15 - 41 U/L   ALT 19 0 - 44 U/L   Alkaline Phosphatase 63 38 - 126 U/L   Total Bilirubin 0.5 0.3 - 1.2 mg/dL   GFR, Estimated >04 >54 mL/min   Anion gap 10 5 - 15  Glucose, capillary  Result Value Ref Range   Glucose-Capillary 121 (H) 70 - 99 mg/dL  Glucose, capillary  Result Value Ref Range   Glucose-Capillary 108 (H) 70 - 99 mg/dL  Surgical pathology  Result Value Ref Range   SURGICAL PATHOLOGY      SURGICAL PATHOLOGY CASE: [id number] PATIENT: Bruce Deleon Surgical Pathology Report     Clinical History: gangrene left leg (cm)     FINAL MICROSCOPIC DIAGNOSIS:  A LEFT LEG, ABOVE THE KNEE AMPUTATION: Distal cutaneous chronic dermatitis with ischemic  changes of dermis and subcutis Underlying changes compatible with chronic osteomyelitis Proximal skin and soft tissue margins grossly viable Stenosing complicated atherosclerosis with calcification and ossification a large arteries Arteriolosclerosis   GROSS DESCRIPTION:  A. Received fresh is a lower limb amputation consisting of the 15.8 x 11.7 cm distal thigh and the 18.1 x 10.0 cm proximal calf with a previous BKA, covered in light tan skin. Tissue at the margin is grossly viable. The previous amputation site is slightly green discolored and sloughing with softening of the adjacent bone. Sampling of the vasculature reveals diffuse calcification of the walls and obstruction of the lumina <90%. Block  Summary A1: vessels and other soft tissue at the margin A2: previous amputation site A3: bone after decalcification A4: sampling of vasculature after decalcification (LEF 05/07/2023)  Final Diagnosis performed by Jerene Bears, MD.   Electronically signed 05/08/2023 Technical and / or Professional components performed at [facility]. [facility], [address] [place], [place] [address].  Immunohistochemistry Technical component (if applicable) was performed at [facility]. [address], [place], [place] [address].   IMMUNOHISTOCHEMISTRY DISCLAIMER (if applicable): Some of these immunohistochemical stains may have been developed and the performance characteristics determine by [facility]. Some may not have been cleared or approved by the U.S. Food and Drug Administration. The FDA has determined that such clearance or approval is not necessary. This test is used for clinical purposes. It should not be regarded as i nvestigational or for research. This laboratory is certified under the Clinical Laboratory Improvement Amendments of 1988 (CLIA-88) as qualified to perform high complexity clinical laboratory testing.  The controls  stained appropriately.   IHC stains are performed on formalin fixed, paraffin embedded tissue using a 3,3"diaminobenzidine (DAB) chromogen and Leica Bond Autostainer System. The staining intensity of the nucleus is score manually and is reported as the percentage of tumor cell nuclei demonstrating specific nuclear staining. The specimens are fixed in 10% Neutral Formalin for at least 6 hours and up to 72hrs. These tests are validated on decalcified tissue. Results should be interpreted with caution given the possibility of false negative results on decalcified specimens. Antibody Clones are as follows ER-clone 29F, PR-clone 16, Ki67- clone MM1. Some of these immunohistochemical stains may have been developed and the performance characteristics determined by [facility].     Discharge Medications:   Allergies as of  05/09/2023   No Known Allergies      Medication List     TAKE these medications    oxyCODONE-acetaminophen 5-325 MG tablet Commonly known as: PERCOCET/ROXICET Take 1 tablet by mouth every 4 (four) hours as needed.   traMADol 50 MG tablet Commonly known as: ULTRAM Take 1 tablet (50 mg total) by mouth every 6 (six) hours as needed.        Diagnostic Studies: No results found.  Disposition: Discharge disposition: 01-Home or Self Care       Discharge Instructions     Call MD / Call 911   Complete by: As directed    If you experience chest pain or shortness of breath, CALL 911 and be transported to the hospital emergency room.  If you develope a fever above 101 F, pus (white drainage) or increased drainage or redness at the wound, or calf pain, call your surgeon's office.   Constipation Prevention   Complete by: As directed    Drink plenty of fluids.  Prune juice may be helpful.  You may use a stool softener, such as Colace (over the counter) 100 mg twice a day.  Use MiraLax (over the counter) for constipation as needed.   Diet - low sodium heart  healthy   Complete by: As directed    Increase activity slowly as tolerated   Complete by: As directed    Post-operative opioid taper instructions:   Complete by: As directed    POST-OPERATIVE OPIOID TAPER INSTRUCTIONS: It is important to wean off of your opioid medication as soon as possible. If you do not need pain medication after your surgery it is ok to stop day one. Opioids include: Codeine, Hydrocodone(Norco, Vicodin), Oxycodone(Percocet, oxycontin) and hydromorphone amongst others.  Long term and even short term use of opiods can cause: Increased pain response Dependence Constipation Depression Respiratory depression And more.  Withdrawal symptoms can include Flu like symptoms Nausea, vomiting And more Techniques to manage these symptoms Hydrate well Eat regular healthy meals Stay active Use relaxation techniques(deep breathing, meditating, yoga) Do Not substitute Alcohol to help with tapering If you have been on opioids for less than two weeks and do not have pain than it is ok to stop all together.  Plan to wean off of opioids This plan should start within one week post op of your joint replacement. Maintain the same interval or time between taking each dose and first decrease the dose.  Cut the total daily intake of opioids by one tablet each day Next start to increase the time between doses. The last dose that should be eliminated is the evening dose.           Follow-up Information     Nadara Mustard, MD Follow up in 1 week(s).   Specialty: Orthopedic Surgery Contact information: [address] [place] [address] [phone]                  Signed: Nadara Mustard 05/09/2023, 9:40 AM

## 2023-05-09 NOTE — Progress Notes (Signed)
Wound vac dressing discontinued per MD orders. Dry guaze dressing applied with ACE wrap compression. Discharge instructions given, verbalized understanding. No remaining questions.

## 2023-05-09 NOTE — Plan of Care (Signed)
  Problem: Education: Goal: Knowledge of the prescribed therapeutic regimen will improve Outcome: Progressing Goal: Ability to verbalize activity precautions or restrictions will improve Outcome: Progressing Goal: Understanding of discharge needs will improve Outcome: Progressing   Problem: Activity: Goal: Ability to perform//tolerate increased activity and mobilize with assistive devices will improve Outcome: Progressing   Problem: Clinical Measurements: Goal: Postoperative complications will be avoided or minimized Outcome: Progressing   Problem: Self-Care: Goal: Ability to meet self-care needs will improve Outcome: Progressing   Problem: Self-Concept: Goal: Ability to maintain and perform role responsibilities to the fullest extent possible will improve Outcome: Progressing   Problem: Pain Management: Goal: Pain level will decrease with appropriate interventions Outcome: Progressing   Problem: Skin Integrity: Goal: Demonstration of wound healing without infection will improve Outcome: Progressing   Problem: Education: Goal: Knowledge of General Education information will improve Description: Including pain rating scale, medication(s)/side effects and non-pharmacologic comfort measures Outcome: Progressing   Problem: Health Behavior/Discharge Planning: Goal: Ability to manage health-related needs will improve Outcome: Progressing   Problem: Clinical Measurements: Goal: Ability to maintain clinical measurements within normal limits will improve Outcome: Progressing Goal: Will remain free from infection Outcome: Progressing Goal: Diagnostic test results will improve Outcome: Progressing Goal: Respiratory complications will improve Outcome: Progressing Goal: Cardiovascular complication will be avoided Outcome: Progressing   Problem: Activity: Goal: Risk for activity intolerance will decrease Outcome: Progressing   Problem: Nutrition: Goal: Adequate nutrition  will be maintained Outcome: Progressing   Problem: Coping: Goal: Level of anxiety will decrease Outcome: Progressing   Problem: Elimination: Goal: Will not experience complications related to bowel motility Outcome: Progressing Goal: Will not experience complications related to urinary retention Outcome: Progressing   Problem: Pain Management: Goal: General experience of comfort will improve Outcome: Progressing   Problem: Safety: Goal: Ability to remain free from injury will improve Outcome: Progressing   Problem: Skin Integrity: Goal: Risk for impaired skin integrity will decrease Outcome: Progressing

## 2023-05-09 NOTE — Progress Notes (Signed)
Patient ID: Bruce Deleon, male   DOB: 12-Sep-1955, 67 y.o.   MRN: [id number] Patient is status post above-knee amputation.  There is no drainage of the wound VAC canister there is not a good suction fit.  Will have the wound VAC removed today apply dry dressing plan for discharge to home today.

## 2023-05-11 LAB — SURGICAL PATHOLOGY

## 2023-05-13 ENCOUNTER — Ambulatory Visit (INDEPENDENT_AMBULATORY_CARE_PROVIDER_SITE_OTHER): Payer: Medicare Other | Admitting: Family

## 2023-05-13 ENCOUNTER — Encounter: Payer: Self-pay | Admitting: Family

## 2023-05-13 DIAGNOSIS — S78112A Complete traumatic amputation at level between left hip and knee, initial encounter: Secondary | ICD-10-CM

## 2023-05-13 DIAGNOSIS — Z89512 Acquired absence of left leg below knee: Secondary | ICD-10-CM

## 2023-05-13 MED ORDER — OXYCODONE-ACETAMINOPHEN 5-325 MG PO TABS: 1.0000 | ORAL_TABLET | Freq: Four times a day (QID) | ORAL | 0 refills | Status: DC | PRN

## 2023-05-13 NOTE — Progress Notes (Signed)
   Post-Op Visit Note   Patient: Bruce Deleon           Date of Birth: 03-19-1956           MRN: [id number] Visit Date: 05/13/2023 PCP: Garnette Gunner, MD  Chief Complaint:  Chief Complaint  Patient presents with   Left Leg - Routine Post Op    08/05/22 left AKA    HPI:  HPI The patient is a 67 year old gentleman who is seen status post above-knee amputation on the left Ortho Exam On examination left leg the staples are in place there is no gaping no drainage no erythema Visit Diagnoses: No diagnosis found.  Plan: Proceed with daily dose of cleansing.  Dry dressings.  Anticipate staple removal at next visit  Follow-Up Instructions: No follow-ups on file.   Imaging: No results found.  Orders:  No orders of the defined types were placed in this encounter.  No orders of the defined types were placed in this encounter.    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   Moderate protein-calorie malnutrition (HCC) 04/13/2019   Leg wound, left, subsequent encounter 10/20/2018   S/P BKA (below knee amputation) unilateral, left (HCC) 08/26/2018   Osteomyelitis (HCC) 08/01/2018   Left foot and ankle infection 08/01/2018   Cellulitis of left foot    Chronic respiratory insufficiency 06/12/2014   ALS (amyotrophic lateral sclerosis) (HCC) 01/25/2013   Essential hypertension 01/25/2013   Past Medical History:  Diagnosis Date   ALS (amyotrophic lateral sclerosis) (HCC) 01/25/2013   Asthma    Chronic respiratory insufficiency 06/12/2014   Essential hypertension 01/25/2013    No family  history on file.  Past Surgical History:  Procedure Laterality Date   AMPUTATION Left 08/06/2018   Procedure: LEFT BELOW KNEE AMPUTATION;  Surgeon: Kathryne Hitch, MD;  Location: [facility];  Service: Orthopedics;  Laterality: Left;   AMPUTATION Left 05/06/2023   Procedure: LEFT ABOVE KNEE AMPUTATION;  Surgeon: Nadara Mustard, MD;  Location: [facility];  Service: Orthopedics;  Laterality: Left;   APPLICATION OF WOUND VAC Left 08/04/2018   Procedure: APPLICATION OF WOUND VAC;  Surgeon: Kathryne Hitch, MD;  Location: [facility];  Service: Orthopedics;  Laterality: Left;   BACK SURGERY     EYE SURGERY Bilateral 2010   cataract   I & D EXTREMITY Left 08/01/2018   Procedure: IRRIGATION AND DEBRIDEMENT foot and ankle amputation first and second rays;  Surgeon: Kathryne Hitch, MD;  Location: [facility];  Service: Orthopedics;  Laterality: Left;   I & D EXTREMITY Left 08/04/2018   Procedure: REPEAT IRRIGATION AND DEBRIDEMENT OF LEFT ANKLE;  Surgeon: Kathryne Hitch, MD;  Location: [facility];  Service: Orthopedics;  Laterality: Left;   Social History   Occupational History   Not on file  Tobacco Use   Smoking status: Some Days    Types: Cigarettes   Smokeless tobacco: Never  Substance and Sexual Activity   Alcohol use: Not on file   Drug use: Not on file   Sexual activity: Not on file

## 2023-05-15 ENCOUNTER — Encounter: Payer: Self-pay | Admitting: Orthopedic Surgery

## 2023-05-22 ENCOUNTER — Ambulatory Visit: Payer: Medicare Other | Admitting: Family

## 2023-05-22 ENCOUNTER — Encounter: Payer: Self-pay | Admitting: Family

## 2023-05-22 ENCOUNTER — Telehealth: Payer: Self-pay | Admitting: Family

## 2023-05-22 DIAGNOSIS — S78112A Complete traumatic amputation at level between left hip and knee, initial encounter: Secondary | ICD-10-CM

## 2023-05-22 DIAGNOSIS — Z89612 Acquired absence of left leg above knee: Secondary | ICD-10-CM

## 2023-05-22 NOTE — Telephone Encounter (Signed)
Patient called and said that he was waiting the medication to be sent in. CB#[phone]

## 2023-05-22 NOTE — Progress Notes (Signed)
Post-Op Visit Note   Patient: Bruce Deleon           Date of Birth: 1956-01-14           MRN: [id number] Visit Date: 05/22/2023 PCP: Garnette Gunner, MD  Chief Complaint:  Chief Complaint  Patient presents with   Left Leg - Routine Post Op    HPI:  HPI The patient is a 67 year old gentleman who is seen status post left lower extremity above-knee amputation.  This is healing well. Ortho Exam On examination left lower extremity staples are in place there is no gaping or drainage the above-knee amputation is well-healed Visit Diagnoses: No diagnosis found.  Plan: Staples harvested today without incident continue daily Dial soap cleansing dry dressings.  Given a order for his prosthesis set up.  Follow-Up Instructions: No follow-ups on file.   Imaging: No results found.  Orders:  No orders of the defined types were placed in this encounter.  No orders of the defined types were placed in this encounter.    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   Moderate protein-calorie malnutrition (HCC) 04/13/2019   Leg wound, left, subsequent encounter 10/20/2018   S/P BKA (below knee amputation) unilateral, left (HCC) 08/26/2018   Osteomyelitis (HCC) 08/01/2018   Left foot and ankle infection 08/01/2018   Cellulitis of left foot    Chronic respiratory insufficiency 06/12/2014   ALS (amyotrophic lateral sclerosis) (HCC) 01/25/2013   Essential hypertension 01/25/2013   Past Medical History:  Diagnosis Date   ALS (amyotrophic lateral sclerosis) (HCC) 01/25/2013   Asthma    Chronic  respiratory insufficiency 06/12/2014   Essential hypertension 01/25/2013    No family history on file.  Past Surgical History:  Procedure Laterality Date   AMPUTATION Left 08/06/2018   Procedure: LEFT BELOW KNEE AMPUTATION;  Surgeon: Kathryne Hitch, MD;  Location: [facility];  Service: Orthopedics;  Laterality: Left;   AMPUTATION Left 05/06/2023   Procedure: LEFT ABOVE KNEE AMPUTATION;  Surgeon: Nadara Mustard, MD;  Location: [facility] OR;  Service: Orthopedics;  Laterality: Left;   APPLICATION OF WOUND VAC Left 08/04/2018   Procedure: APPLICATION OF WOUND VAC;  Surgeon: Kathryne Hitch, MD;  Location: [facility];  Service: Orthopedics;  Laterality: Left;   BACK SURGERY     EYE SURGERY Bilateral 2010   cataract   I & D EXTREMITY Left 08/01/2018   Procedure: IRRIGATION AND DEBRIDEMENT foot and ankle amputation first and second rays;  Surgeon: Kathryne Hitch, MD;  Location: [facility];  Service: Orthopedics;  Laterality: Left;   I & D EXTREMITY Left 08/04/2018   Procedure: REPEAT IRRIGATION AND DEBRIDEMENT OF LEFT ANKLE;  Surgeon: Kathryne Hitch, MD;  Location: [facility];  Service: Orthopedics;  Laterality: Left;   Social History   Occupational History   Not on file  Tobacco Use   Smoking status: Some Days    Types: Cigarettes   Smokeless tobacco: Never  Substance and Sexual Activity   Alcohol use: Not on file   Drug use: Not on file   Sexual activity: Not on file

## 2023-05-25 NOTE — Telephone Encounter (Signed)
Denny Peon you saw this pt on Friday did you discuss possibly sending in refill for pain medication?

## 2023-05-26 MED ORDER — OXYCODONE-ACETAMINOPHEN 5-325 MG PO TABS: 1.0000 | ORAL_TABLET | Freq: Four times a day (QID) | ORAL | 0 refills | Status: AC | PRN

## 2023-05-26 NOTE — Addendum Note (Signed)
Addended by: Adonis Huguenin on: 05/26/2023 08:43 AM   Modules accepted: Orders

## 2023-05-29 ENCOUNTER — Telehealth: Payer: Self-pay

## 2023-05-29 ENCOUNTER — Encounter: Payer: Self-pay | Admitting: Orthopedic Surgery

## 2023-05-29 NOTE — Telephone Encounter (Signed)
Called and sw pt's wife and advised Dr. Lajoyce Corners would like for pt to wear a compression sock on the right leg or to use an ace bandage wrapping from the base of his toes to the bend of the knee. [name] wife said that she was close to [facility] and would pick up a compression sock for pt to use and will call with any other questions.

## 2023-06-17 ENCOUNTER — Ambulatory Visit: Payer: Medicare Other | Admitting: Family

## 2023-06-18 ENCOUNTER — Ambulatory Visit (INDEPENDENT_AMBULATORY_CARE_PROVIDER_SITE_OTHER): Payer: Medicare Other | Admitting: Orthopedic Surgery

## 2023-06-18 DIAGNOSIS — S78112A Complete traumatic amputation at level between left hip and knee, initial encounter: Secondary | ICD-10-CM

## 2023-06-18 DIAGNOSIS — Z89612 Acquired absence of left leg above knee: Secondary | ICD-10-CM

## 2023-06-22 ENCOUNTER — Encounter: Payer: Self-pay | Admitting: Orthopedic Surgery

## 2023-06-22 NOTE — Progress Notes (Signed)
Office Visit Note   Patient: Bruce Deleon           Date of Birth: May 16, 1956           MRN: [id number] Visit Date: 06/18/2023              Requested by: Garnette Gunner, MD [address] [place],  [place] [address] PCP: Garnette Gunner, MD  Chief Complaint  Patient presents with   Left Leg - Routine Post Op    Left AKA      HPI: Patient is a 67 year old gentleman who is 2 months status post left above-knee amputation.  Patient has received an order for his prosthesis.  Assessment & Plan: Visit Diagnoses: No diagnosis found.  Plan: Patient will follow-up with [facility] for prosthetic fitting and follow-up upstairs for physical therapy.  Follow-Up Instructions: Return in about 2 months (around 08/19/2023).   Ortho Exam  Patient is alert, oriented, no adenopathy, well-dressed, normal affect, normal respiratory effort. Patient's residual limb is well consolidated.  Patient has some nerve pain laterally over the sciatic nerve.  Recommended Voltaren gel.  Imaging: No results found. No images are attached to the encounter.  Labs: Lab Results  Component Value Date   REPTSTATUS 08/07/2018 FINAL 08/01/2018   GRAMSTAIN NO ORGANISMS SEEN MODERATE GRAM POSITIVE COCCI  08/01/2018   CULT  08/01/2018    MODERATE STAPHYLOCOCCUS AUREUS NO ANAEROBES ISOLATED Performed at [facility], [address], [place], [place] [address]    [facility] STAPHYLOCOCCUS AUREUS 08/01/2018     Lab Results  Component Value Date   ALBUMIN 3.5 05/06/2023   ALBUMIN 2.1 (L) 08/04/2018   ALBUMIN 2.1 (L) 08/03/2018   PREALBUMIN 17 (L) 05/06/2023    Lab Results  Component Value Date   MG 2.1 08/06/2018   MG 2.1 08/05/2018   MG 2.4 08/04/2018   No results found for: "VD25OH"  Lab Results  Component Value Date   PREALBUMIN 17 (L) 05/06/2023      Latest Ref Rng & Units 05/06/2023    8:27 AM 08/10/2018    3:55 AM 08/09/2018    3:42 AM  CBC EXTENDED  WBC 4.0 - 10.5 K/uL  7.1  16.7  14.3   RBC 4.22 - 5.81 MIL/uL 5.25  4.35  4.19   Hemoglobin 13.0 - 17.0 g/dL 33.2  95.1  88.4   HCT 39.0 - 52.0 % 48.9  40.9  39.5   Platelets 150 - 400 K/uL 199  381  415   NEUT# 1.7 - 7.7 K/uL 5.4     Lymph# 0.7 - 4.0 K/uL 0.9        There is no height or weight on file to calculate BMI.  Orders:  No orders of the defined types were placed in this encounter.  No orders of the defined types were placed in this encounter.    Procedures: No procedures performed  Clinical Data: No additional findings.  ROS:  All other systems negative, except as noted in the HPI. Review of Systems  Objective: Vital Signs: There were no vitals taken for this visit.  Specialty Comments:  No specialty comments available.  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   Moderate protein-calorie malnutrition (HCC) 04/13/2019   Leg wound, left, subsequent encounter 10/20/2018   S/P BKA (below knee amputation) unilateral, left (HCC) 08/26/2018   Osteomyelitis (HCC) 08/01/2018   Left foot and ankle infection 08/01/2018   Cellulitis of left foot    Chronic respiratory insufficiency 06/12/2014   ALS (amyotrophic lateral sclerosis) (HCC) 01/25/2013   Essential hypertension 01/25/2013   Past Medical History:  Diagnosis Date   ALS (amyotrophic lateral sclerosis) (HCC) 01/25/2013   Asthma    Chronic respiratory insufficiency 06/12/2014   Essential hypertension 01/25/2013    History reviewed. No pertinent family history.  Past Surgical History:  Procedure Laterality Date   AMPUTATION Left  08/06/2018   Procedure: LEFT BELOW KNEE AMPUTATION;  Surgeon: Kathryne Hitch, MD;  Location: [facility];  Service: Orthopedics;  Laterality: Left;   AMPUTATION Left 05/06/2023   Procedure: LEFT ABOVE KNEE AMPUTATION;  Surgeon: Nadara Mustard, MD;  Location: [facility] OR;  Service: Orthopedics;  Laterality: Left;   APPLICATION OF WOUND VAC Left 08/04/2018   Procedure: APPLICATION OF WOUND VAC;  Surgeon: Kathryne Hitch, MD;  Location: [facility];  Service: Orthopedics;  Laterality: Left;   BACK SURGERY     EYE SURGERY Bilateral 2010   cataract   I & D EXTREMITY Left 08/01/2018   Procedure: IRRIGATION AND DEBRIDEMENT foot and ankle amputation first and second rays;  Surgeon: Kathryne Hitch, MD;  Location: [facility];  Service: Orthopedics;  Laterality: Left;   I & D EXTREMITY Left 08/04/2018   Procedure: REPEAT IRRIGATION AND DEBRIDEMENT OF LEFT ANKLE;  Surgeon: Kathryne Hitch, MD;  Location: [facility];  Service: Orthopedics;  Laterality: Left;   Social History   Occupational History   Not on file  Tobacco Use   Smoking status: Some Days    Types: Cigarettes   Smokeless tobacco: Never  Substance and Sexual Activity   Alcohol use: Not on file   Drug use: Not on file   Sexual activity: Not on file

## 2023-07-01 ENCOUNTER — Encounter: Payer: Medicare Other | Admitting: Physical Therapy

## 2023-07-21 ENCOUNTER — Encounter: Payer: Medicare Other | Admitting: Physical Therapy

## 2023-08-20 ENCOUNTER — Ambulatory Visit: Payer: Medicare Other | Admitting: Orthopedic Surgery

## 2024-05-16 ENCOUNTER — Encounter: Payer: Self-pay | Admitting: Radiology

## 2024-06-13 ENCOUNTER — Telehealth: Payer: Self-pay

## 2024-06-13 NOTE — Telephone Encounter (Signed)
 Called to set up CPE for patient but no answer and unable to leave VM.

## 2024-06-21 ENCOUNTER — Ambulatory Visit: Admitting: Podiatry

## 2024-06-30 ENCOUNTER — Ambulatory Visit: Admitting: Podiatry
